# Patient Record
Sex: Male | Born: 1950
Health system: Southern US, Community
[De-identification: ages and names within clinical notes are randomized; demographics above are authoritative.]

## PROBLEM LIST (undated history)

## (undated) DIAGNOSIS — M79604 Pain in right leg: Secondary | ICD-10-CM

## (undated) DIAGNOSIS — M1990 Unspecified osteoarthritis, unspecified site: Secondary | ICD-10-CM

## (undated) DIAGNOSIS — J3489 Other specified disorders of nose and nasal sinuses: Secondary | ICD-10-CM

## (undated) DIAGNOSIS — M545 Low back pain, unspecified: Secondary | ICD-10-CM

## (undated) DIAGNOSIS — J302 Other seasonal allergic rhinitis: Secondary | ICD-10-CM

## (undated) DIAGNOSIS — I10 Essential (primary) hypertension: Secondary | ICD-10-CM

## (undated) DIAGNOSIS — K219 Gastro-esophageal reflux disease without esophagitis: Secondary | ICD-10-CM

## (undated) HISTORY — PX: COLONOSCOPY W/ POLYPECTOMY: SHX1380

## (undated) HISTORY — PX: TONSILLECTOMY: SUR1361

## (undated) HISTORY — PX: BACK SURGERY: SHX140

## (undated) HISTORY — PX: APPENDECTOMY: SHX54

## (undated) HISTORY — PX: ROTATOR CUFF REPAIR: SHX139

## (undated) HISTORY — DX: Low back pain: M54.5

## (undated) HISTORY — PX: JOINT REPLACEMENT: SHX530

## (undated) HISTORY — DX: Low back pain, unspecified: M54.50

## (undated) HISTORY — DX: Pain in right leg: M79.604

## (undated) HISTORY — PX: KNEE ARTHROSCOPY: SHX127

---

## 1997-09-23 ENCOUNTER — Encounter: Admission: RE | Admit: 1997-09-23 | Discharge: 1997-10-23 | Payer: Self-pay | Admitting: Anesthesiology

## 2000-02-19 ENCOUNTER — Ambulatory Visit (HOSPITAL_BASED_OUTPATIENT_CLINIC_OR_DEPARTMENT_OTHER): Admission: RE | Admit: 2000-02-19 | Discharge: 2000-02-19 | Payer: Self-pay | Admitting: *Deleted

## 2003-04-08 ENCOUNTER — Ambulatory Visit (HOSPITAL_BASED_OUTPATIENT_CLINIC_OR_DEPARTMENT_OTHER): Admission: RE | Admit: 2003-04-08 | Discharge: 2003-04-08 | Payer: Self-pay | Admitting: Specialist

## 2003-04-08 ENCOUNTER — Ambulatory Visit (HOSPITAL_COMMUNITY): Admission: RE | Admit: 2003-04-08 | Discharge: 2003-04-08 | Payer: Self-pay | Admitting: Specialist

## 2003-05-06 ENCOUNTER — Encounter: Admission: RE | Admit: 2003-05-06 | Discharge: 2003-05-06 | Payer: Self-pay | Admitting: Internal Medicine

## 2003-06-21 ENCOUNTER — Encounter: Admission: RE | Admit: 2003-06-21 | Discharge: 2003-06-21 | Payer: Self-pay | Admitting: Specialist

## 2005-09-09 ENCOUNTER — Ambulatory Visit (HOSPITAL_COMMUNITY): Admission: RE | Admit: 2005-09-09 | Discharge: 2005-09-10 | Payer: Self-pay | Admitting: Specialist

## 2007-03-15 ENCOUNTER — Emergency Department (HOSPITAL_COMMUNITY): Admission: EM | Admit: 2007-03-15 | Discharge: 2007-03-15 | Payer: Self-pay | Admitting: Emergency Medicine

## 2009-02-19 ENCOUNTER — Encounter: Admission: RE | Admit: 2009-02-19 | Discharge: 2009-02-19 | Payer: Self-pay | Admitting: Specialist

## 2009-02-21 ENCOUNTER — Ambulatory Visit (HOSPITAL_BASED_OUTPATIENT_CLINIC_OR_DEPARTMENT_OTHER): Admission: RE | Admit: 2009-02-21 | Discharge: 2009-02-22 | Payer: Self-pay | Admitting: Specialist

## 2010-02-22 ENCOUNTER — Encounter: Payer: Self-pay | Admitting: Specialist

## 2010-04-19 LAB — BASIC METABOLIC PANEL
BUN: 14 mg/dL (ref 6–23)
Chloride: 103 mEq/L (ref 96–112)
Creatinine, Ser: 1.14 mg/dL (ref 0.4–1.5)
GFR calc non Af Amer: >60 mL/min (ref >60)
Glucose, Bld: 87 mg/dL (ref 70–99)

## 2010-04-19 LAB — CBC
MCHC: 34 g/dL (ref 30.0–36.0)
Platelets: 253 10*3/uL (ref 150–400)
RDW: 11.9 % (ref 11.5–15.5)

## 2010-06-19 NOTE — Op Note (Signed)
NAME:  Manuel Hood, Manuel Hood                           ACCOUNT NO.:  [id number]   MEDICAL RECORD NO.:  [id number]                   PATIENT TYPE:  AMB   LOCATION:  DSC                                  FACILITY:  [facility]   PHYSICIAN:  Jene Every, M.D.                 DATE OF BIRTH:  Mar 21, 1950   DATE OF PROCEDURE:  04/08/2003  DATE OF DISCHARGE:                                 OPERATIVE REPORT   PREOPERATIVE DIAGNOSIS:  Medial meniscus tear, degenerative joint disease  right knee.   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   PROCEDURE:  1. Right knee arthroscopy with partial medial meniscectomy.  2. Chondroplasty of the medial femoral condyle.  3. Chondroplasty of the patella.   ANESTHESIA:  General.   SURGEON:  Jene Every, M.D.   ASSISTANT:  None.   DISPOSITION:  The patient tolerated the procedure well with no  complications.  Follow up in 10 days for suture removal.   DESCRIPTION OF PROCEDURE:  With the patient in the supine position after  induction of general anesthesia and 1 gram of Kefzol, the right lower  extremity was prepped and draped in the usual sterile fashion.  A lateral  parapatellar portal and a superior medial parapatellar portal was fashioned  with a #11 blade.  Ingress cannula was atraumatically placed.  Irrigant was  utilized and inflated the joint.  Arthroscopic camera then inserted via  lateral portal and under direct visualization, a medial parapatellar portal  was fashion with a #11 blade after localization with an 18 gauge needle  sparing the medial meniscus.  Inspection of the suprapatellar pouch revealed  extensive grade III changes of the patella with fibrillation.  Loose  cartilaginous bodies noted and debris within the joint.  I introduced the  shaver and utilized it to perform a chondroplasty of the patella.  There was  normal patellofemoral tracking.  The  gutters were unremarkable.  In the  medial compartment, there was extensive grade III changes of the medial  femoral condyle and a complex tear of the posterior aspect of the meniscus  unstable.  Basket rongeurs were introduced and utilized to perform a partial  medial meniscectomy to a stable base.  Further contoured with a 4-2 Cuda  shaver.  Chondroplasty of the medial femoral condyle was performed as well.  Grade III lesion was noted.  There was an extensive involvement of the  medial femoral condyle, no grade IV change.   ACL and PCL unremarkable.  Lateral compartment revealed essentially normal  lateral meniscus, femoral condyle, and tibial plateau, some minor grade I to  II changes.  Stable to probe palpation.  No evidence of osteochondral defect  with probing and visualization of the entire femur and tibia.  Again back to  the medial meniscus, it was stable to probe palpation  following the  meniscectomy.  The remainder of the condyle and tibial plateau was  unremarkable.  Next, the knee was copiously lavaged.  All instrumentation  was removed.  Portals were closed with 4-0 nylon  subcuticular suture.  0.25% Marcaine with epinephrine was infiltrated in the  joint.  The wound was dressed sterilely.  He was awakened without difficulty  and transported to the recovery room in satisfactory condition.  The patient  tolerated the procedure well with no complications.                                               Jene Every, M.D.    Cordelia Pen  D:  04/08/2003  T:  04/09/2003  Job:  [id number]

## 2010-06-19 NOTE — Op Note (Signed)
[facility]  Patient:    Manuel Hood, Manuel Hood                        MRN: [id number] Proc. Date: 02/19/00 Adm. Date:  [id number] Disc. Date: [id number] Attending:  Aundria Mems                           Operative Report  PREOPERATIVE DIAGNOSES: 1. Deviated nasal septum. 2. Hyperplasia of nasal turbinates.  POSTOPERATIVE DIAGNOSES: 1. Deviated nasal septum. 2. Hyperplasia of nasal turbinates.  OPERATIVE PROCEDURE: 1. Nasal septoplasty. 2. Bilateral submucous resection of inferior turbinates.  SURGEON:  Kathy Breach, M.D.  DESCRIPTION OF PROCEDURE:  With the patient under general orotracheal anesthesia, the nose was prepped and draped in a sterile fashion.  A nasal block was applied with 4% Xylocaine with 1% ephedrine solution and olive-tip probes to the sphenopalatine and anterior ethmoid nerve areas bilaterally. Cotton pledgets soaked with a similar solution were inserted along the middle and inferior turbinates for vasoconstriction.  The columella and nasal septum and later inferior turbinates were infiltrated with 1% Xylocaine with 1:100,000 epinephrine for further vasoconstrictive effort.  Inspection of the patients nose with a decongestant, he had a marked deviation off the maxillary crest into the right side, nearly abutting on to the decongested inferior turbinate.  Superiorly, there was deflection of the perpendicular ethmoid plate to the opposite side to the left.  The patient had prominent middle turbinate, particularly on the right side, and compensatory hyperplasia of the left inferior turbinate, and a prominent right inferior turbinate.  A caudal incision was made inside the left nasal vestibule and mucosal flaps were elevated off the cartilaginous bony septum bilaterally.  The posterior and inferior quadrilateral cartilage along with the maxillary crest spur was taken down with knives and rongeurs.  The quadrilateral cartilage  was separated from the perpendicular ethmoid bony plate posterosuperiorly, and the deflected ethmoid plate removed back into the vomer which had a spur to the right posteriorly.  This allowed the septum to come into a near straight midline position with ________ airways bilaterally.  There was a tear along the maxillary crest spur into the right side and the left mucosal flap was completely intact.  A stab incision was made over the anterior aspect of the left inferior turbinate.  A superiorly based septal surface mucosal flap was elevated off the large and prominent turbinate bone.  The lower half of turbinate bone was attached.  The mucosa was sharply excised with angled scissors.  Hemostasis along the bony mucosal incisions was made complete with suction cautery as well as ablating the posterior extension of the turbinate.  A similar procedure was done to the right inferior turbinate.  The prominent right middle turbinate was crushed and gently outfractured as well.  The simple incision was then closed with interrupted sutures of 4-0 chromic catgut.  The nose was packed bilaterally with Vaseline gauze impregnated with Cortisporin ointment.  The patients oropharynx was cleared at the end of the case.  The patient tolerated the procedure well and was taken to the recovery room in stable general condition.  Estimated blood loss for the procedure was less than 50 cc. DD:  02/19/00 TD:  02/21/00 Job: [id number]

## 2010-06-19 NOTE — Op Note (Signed)
NAME:  Manuel Hood, Manuel Hood                 ACCOUNT NO.:  [id number]   MEDICAL RECORD NO.:  [id number]          PATIENT TYPE:  AMB   LOCATION:  DAY                          FACILITY:  [facility]   PHYSICIAN:  Jene Every, M.D.    DATE OF BIRTH:  [birth year]   DATE OF PROCEDURE:  09/09/2005  DATE OF DISCHARGE:                                 OPERATIVE REPORT   PREOPERATIVE DIAGNOSIS:  Spinal stenosis, L4-5, right, with L5  radiculopathy.   POSTOPERATIVE DIAGNOSIS:  Spinal stenosis, L4-5, right, with L5  radiculopathy.   PROCEDURE PERFORMED:  1. Lateral recess decompression, L4-5, right.  2. Microdiskectomy, L4-5.  3. Foraminotomy of 4 and 5.   SURGEON:  Jene Every, M.D.   ASSISTANT:  Roma Schanz, P.A.   ANESTHESIA:  General.   BLOOD LOSS:  Minimal.   BRIEF HISTORY AND INDICATION:  This is a 60 year old gentleman treated for a  long period of time for right lower extremity radicular pain, predominantly  in the L5 nerve root distribution.  The patient had increasing pain  radiating to the buttocks, thigh, calf and into the dorsum of the foot.  Particularly in the L5 nerve root distribution, he had EHL weakness and  positive neurotension signs.  Interestingly, his MRI demonstrated some  lateral recess stenosis at 4-5 and disk protrusion is at 3-4, bulging to the  right foramen, possible effacement of the 3 nerve root; however, he had no  L3 nerve root pain.  He had predominantly 4-5 pain.  On his AP x-ray, he had  some slight scoliosis to the right with slight asymmetry of the disk, facet  hypertrophy and in the parasagittal image on the MRI, he had lateral recess  stenosis.  He was indicated for a decompression due to his refractory pain  and EHL weakness.  He had epidural steroid injections in the past with  temporary relief of the symptoms.  We did discuss the risks and benefits,  given there was no large disk herniation, therefore of bleeding, infection,  damage to vascular  structures, no change in symptoms, worsening of the  symptoms and need for repeat decompression in the future, anesthetic  complications, etc.   TECHNIQUE:  The patient was placed in a supine position and after the  induction of adequate anesthesia and 1 g of Kefzol, he was placed prone on  the Bug Tussle frame.  All bony prominences were well-padded.  The lumbar  region was prepped and draped in the usual sterile fashion.  Two 18-gauge  spinal needles were utilized to localize the 4-5 interspace and confirmed  with x-ray.  Incision was made from the spinous process of 4 to 5.  Subcutaneous tissue was dissected and electrocautery was utilized to achieve  hemostasis.  The dorsolumbar fascia was identified bilaterally in the skin  incision and paraspinous muscle elevated from the lamina of 4 and 5.  A  McCullough retractor was then placed.  With a Penfield 4 in the interlaminar  space, we confirmed the x-ray at 4-5.  We did this with the AP lateral x-  rays that were  taken and the MRI that was read preoperatively to confirm the  4-5 space.  There was a very small interlaminar window at 4-5.  This was  secondary to facet hypertrophy.  I performed a hemilaminotomy of the caudate  edge at 4 using a high-speed bur and completing that with a 10-mm Kerrison.  The superior articulating facet of 5 was noted to be impinging into the  lateral recess.  It was compressing upon the 5 root.  I then performed a  foraminotomy of 5, removing the caudad edge of 5 with a 2-mm Kerrison with  multiple passes, performing a near-hemilaminectomy of 5 with a foraminotomy  of 5.  At a point just here medial and at the facet, due to facet  hypertrophy, ligamentum flavum infold and a small disk protrusion, there was  severe compression of the 5 root in the lateral recess.  There was an  epidural venous plexus noted as well, tethering the 5 root.  This was lysed  and we decompressed the 5 root in the lateral recess to the  medial border of  the pedicle.  We continue caudad to release the ligamentum flavum from the  caudad edge of 4.  Bone wax was placed on the cancellous surfaces due to a  small disk protrusion noted in order to perform the annulotomy and remove  the small disk material that was subannular; this was a straight-biting  pituitary.  A single incision was made in the annulus.  I then passed the  __________ -tip probe down the foramen of 5 and through the foramen of 4;  they were widely patent.  There was good excursion of 5 for at least a  centimeter medial to the pedicle.  No active bleeding or CSF leakage was  noted.  It was felt that the pathology here was consistent with that seen on  his exam and the parasagittal MRI imaging.   After copious irrigation with antibiotic irrigation, thrombin-soaked Gelfoam  was placed into the laminotomy defect area.  A final radiograph was obtained  with probing the foramen of 4 to confirm the 4-5 level on the right.  Following this, again the disk space was copiously irrigated.  Thrombin-  soaked Gelfoam was placed and the McCullough retractor was removed.  The  paraspinous muscles were inspected with no evidence of active bleeding.  Copious irrigation was utilized.  The dorsolumbar fascia was reapproximated  with #1 Vicryl interrupted figure-of-eight sutures, subcutaneous tissue  reapproximated with 2-0 Vicryl subcuticular stitches and skin was  reapproximated with 4-0 subcuticular Prolene and wound reinforced with Steri-  Strips, sterile dressing applied, placed supine in the hospital bed,  extubated without difficulty and transported to the recovery room in  satisfactory condition.   The patient tolerated the procedure well with no complication.      Jene Every, M.D.  Electronically Signed     JB/MEDQ  D:  09/09/2005  T:  09/09/2005  Job:  [id number]

## 2010-11-09 ENCOUNTER — Ambulatory Visit (HOSPITAL_BASED_OUTPATIENT_CLINIC_OR_DEPARTMENT_OTHER)
Admission: RE | Admit: 2010-11-09 | Discharge: 2010-11-09 | Disposition: A | Discharge status: Home / Self Care | Payer: BC Managed Care – PPO | Source: Ambulatory Visit | Attending: Specialist | Admitting: Specialist

## 2010-11-09 DIAGNOSIS — Z01810 Encounter for preprocedural cardiovascular examination: Secondary | ICD-10-CM | POA: Insufficient documentation

## 2010-11-09 DIAGNOSIS — M23329 Other meniscus derangements, posterior horn of medial meniscus, unspecified knee: Secondary | ICD-10-CM | POA: Insufficient documentation

## 2010-11-09 DIAGNOSIS — I10 Essential (primary) hypertension: Secondary | ICD-10-CM | POA: Insufficient documentation

## 2010-11-09 DIAGNOSIS — Z01812 Encounter for preprocedural laboratory examination: Secondary | ICD-10-CM | POA: Insufficient documentation

## 2010-11-09 DIAGNOSIS — M1710 Unilateral primary osteoarthritis, unspecified knee: Secondary | ICD-10-CM | POA: Insufficient documentation

## 2010-11-09 DIAGNOSIS — M23302 Other meniscus derangements, unspecified lateral meniscus, unspecified knee: Secondary | ICD-10-CM | POA: Insufficient documentation

## 2010-11-09 LAB — POCT I-STAT 4, (NA,K, GLUC, HGB,HCT)
Glucose, Bld: 86 mg/dL (ref 70–99)
HCT: 47 % (ref 39.0–52.0)
Hemoglobin: 16 g/dL (ref 13.0–17.0)

## 2010-11-12 NOTE — Op Note (Signed)
  Manuel Hood, Manuel Hood                 ACCOUNT NO.:  [id number]  MEDICAL RECORD NO.:  [id number]  LOCATION:                               FACILITY:  [facility]  PHYSICIAN:  Jene Every, M.D.    DATE OF BIRTH:  Feb 12, 1950  DATE OF PROCEDURE:  11/09/2010 DATE OF DISCHARGE:                              OPERATIVE REPORT   PREOPERATIVE DIAGNOSIS:  Medial meniscus tear, osteoarthritis of the right knee.  POSTOPERATIVE DIAGNOSIS:  Medial meniscus tear, osteoarthritis of the right knee, lateral meniscus tear.  PROCEDURE PERFORMED: 1. Right knee arthroscopy. 2. Partial medial and lateral meniscectomy. 3. Chondroplasty medial femoral condyle of the patella.  ANESTHESIA:  General.  No assistant.  BRIEF HISTORY:  This 60 year old with history of arthroscopy, DJD, osteoarthritis, refractory conservative treatment, mechanical symptoms, locking and giving way is indicated for diagnostic arthroscopy and debridement.  Risks and benefits discussed including bleeding, infection, damage to neurovascular structures, no change in symptoms, worsening symptoms, need for repeat debridement, DVT, PE, anesthetic complications, total knee in the future.  TECHNIQUE:  With patient in supine position after induction of adequate general anesthesia, 2 g of Kefzol.  The right lower extremity was prepped and draped in the usual sterile fashion.  A lateral parapatellar portal and superomedial parapatellar portal was fashioned with a 11 blade.  Ingress cannula atraumatically placed.  Irrigant was utilized to insufflate the joint.  Under direct visualization, a medial parapatellar portal was fashioned with a 11 blade after localization with an 18-gauge needle sparing the medial meniscus.  Noticed in the medial compartment was extensive grade 4 changes on the tibial plateau as well as the femoral condyle.  A complex tearing of the entire posterior half of the meniscus was noted, stable, introduced a basket rongeur  and performed partial medial meniscectomy to a stable base further contoured with a 3 5 Cuda shaver.  Light chondroplasty of the femoral condyle was performed as well.  Evacuated loose bodies.  ACL was unremarkable.  Lateral compartment revealed radial tear in the lateral meniscus and fraying.  This was shaved to a stable base with 3 5 Cuda shaver.  The femoral condyle and tibial plateau showed some grade 2 changes.  No grade 4 changes.  Suprapatellar pouch revealed some extensive grade 3 changes of the patella.  Normal patellofemoral tracking.  Gutters were unremarkable. Performed chondroplasty of the patella.  I lavaged knee, all compartments revisited.  No further pathology amenable to arthroscopic intervention.  I therefore removed all instrumentation.  Portals were closed with 4-0 nylon simple sutures. Marcaine 0.25% with epinephrine was infiltrated in the joint.  Wounds dressed sterilely, awoken without difficulty, and transported to recovery room in satisfactory condition.  Patient tolerated the procedure well.  No complications.     Jene Every, M.D.     Cordelia Pen  D:  11/09/2010  T:  11/10/2010  Job:  [id number]  Electronically Signed by Jene Every M.D. on 11/12/2010 02:25:50 PM

## 2010-12-29 ENCOUNTER — Other Ambulatory Visit: Payer: Self-pay | Admitting: Otolaryngology

## 2011-01-19 ENCOUNTER — Encounter (HOSPITAL_COMMUNITY): Payer: Self-pay | Admitting: Pharmacy Technician

## 2011-01-22 NOTE — H&P (Signed)
Chief Complaint:  right knee pain.  Subjective: Patient is admitted for right total knee arthroplasty.  Patient is a 60 y.o. male who has been seen by Dr. Shelle Iron for ongoing hip pain.  They have been followed and found to have continued progressive pain.  They have been treated conservatively in the past including medications.  Despite conservative measures, they continue to have pain.  X-rays show that the patient has end stage arthritis of the knee.  It is felt that they would benefit from undergoing surgical intervention.  Risks and benefits have been discussed with the patient and they elect to proceed with surgery.  The patient has no contraindications to the upcoming procedure such as ongoing infection or progressive neurological disease.  Allergies: No Known Allergies   Medications: Micardis  Past Medical History: HTN   Past Surgical History: Knee arthroscopy RCR   Family History: No family history on file.  Social History: Quit smoking 15 years ago Drinks 1-2 beers a day Married Plans to go home with family after surgery   Review of Systems General: No chills, fevers, night sweats, fatigue. Neuro:  No seizures, syncope, paralysis, visual problems. Respiratory:  No shortness of breath, productive cough, hemoptysis. Cardiovascular: No chest pain, angina, palpitations, orthopnea. GI: No nausea, vomiting, diarrhea, constipation. GU:  No dysuria, hematuria, discharge. Musculoskeletal:  Joint pain  Physical Exam: GENERAL: Patient is a 60 y.o. male, well-nourished, well-developed, no acute distress. Alert, oriented, cooperative. HENT:  Normocephalic, atraumatic. Pupils round and reactive. EOMs intact. NECK:  Supple, no bruits. CHEST:  Clear to anterior and posterior chest walls. No rhonchi, rales, wheezes. HEART:  Regular, rate and rhythm.  No murmurs.  S1 and S2 noted. ABDOMEN:  Soft, nontender, bowel sounds present. RECTAL/BREAST/GENITALIA:  Not done, not pertinent to  present illness. EXTREMITIES:  Mild effusion, TTP medial joint line, + crepitus   Vitals: Pulse:80 Respirations:10 Blood Pressure:138/90   Assessment/Plan: End stage arthritis, right knee  The patient is being admitted to [facility] to undergo a right total knee arthroplasty.  Surgery will be performed by Dr. Jene Every.  Risks and benefits have been discussed with the patient and they elect to proceed wth the procedure.

## 2011-01-29 ENCOUNTER — Ambulatory Visit (HOSPITAL_COMMUNITY)
Admission: RE | Admit: 2011-01-29 | Discharge: 2011-01-29 | Disposition: A | Discharge status: Home / Self Care | Payer: Managed Care, Other (non HMO) | Source: Ambulatory Visit | Attending: Otolaryngology | Admitting: Otolaryngology

## 2011-01-29 ENCOUNTER — Encounter (HOSPITAL_COMMUNITY)
Admission: RE | Admit: 2011-01-29 | Discharge: 2011-01-29 | Disposition: A | Discharge status: Home / Self Care | Payer: Managed Care, Other (non HMO) | Source: Ambulatory Visit | Attending: Specialist | Admitting: Specialist

## 2011-01-29 ENCOUNTER — Encounter (HOSPITAL_COMMUNITY): Payer: Self-pay

## 2011-01-29 DIAGNOSIS — Z01812 Encounter for preprocedural laboratory examination: Secondary | ICD-10-CM | POA: Insufficient documentation

## 2011-01-29 DIAGNOSIS — M25469 Effusion, unspecified knee: Secondary | ICD-10-CM | POA: Insufficient documentation

## 2011-01-29 DIAGNOSIS — M898X9 Other specified disorders of bone, unspecified site: Secondary | ICD-10-CM | POA: Insufficient documentation

## 2011-01-29 DIAGNOSIS — Z01818 Encounter for other preprocedural examination: Secondary | ICD-10-CM | POA: Insufficient documentation

## 2011-01-29 HISTORY — DX: Essential (primary) hypertension: I10

## 2011-01-29 HISTORY — DX: Other specified disorders of nose and nasal sinuses: J34.89

## 2011-01-29 LAB — COMPREHENSIVE METABOLIC PANEL
AST: 21 U/L (ref 0–37)
Albumin: 4 g/dL (ref 3.5–5.2)
BUN: 18 mg/dL (ref 6–23)
Chloride: 103 mEq/L (ref 96–112)
Creatinine, Ser: 1 mg/dL (ref 0.50–1.35)
Potassium: 4.1 mEq/L (ref 3.5–5.1)
Total Protein: 7.5 g/dL (ref 6.0–8.3)

## 2011-01-29 LAB — URINALYSIS, ROUTINE W REFLEX MICROSCOPIC
Glucose, UA: NEGATIVE mg/dL
Ketones, ur: NEGATIVE mg/dL
Leukocytes, UA: NEGATIVE
Nitrite: NEGATIVE
Specific Gravity, Urine: 1.014 (ref 1.005–1.030)
pH: 6.5 (ref 5.0–8.0)

## 2011-01-29 LAB — CBC
HCT: 46.9 % (ref 39.0–52.0)
MCHC: 35.8 g/dL (ref 30.0–36.0)
MCV: 93.4 fL (ref 78.0–100.0)
Platelets: 233 10*3/uL (ref 150–400)
RDW: 12.1 % (ref 11.5–15.5)
WBC: 8.5 10*3/uL (ref 4.0–10.5)

## 2011-01-29 LAB — DIFFERENTIAL
Lymphs Abs: 1.6 10*3/uL (ref 0.7–4.0)
Monocytes Absolute: 0.7 10*3/uL (ref 0.1–1.0)
Monocytes Relative: 8 % (ref 3–12)
Neutro Abs: 5.7 10*3/uL (ref 1.7–7.7)
Neutrophils Relative %: 68 % (ref 43–77)

## 2011-01-29 LAB — SURGICAL PCR SCREEN
MRSA, PCR: NEGATIVE
Staphylococcus aureus: POSITIVE — AB

## 2011-01-29 LAB — APTT: aPTT: 28 seconds (ref 24–37)

## 2011-01-29 LAB — PROTIME-INR: INR: 1 (ref 0.00–1.49)

## 2011-01-29 MED ORDER — CHLORHEXIDINE GLUCONATE 4 % EX LIQD: 60.0000 mL | Freq: Once | CUTANEOUS | Status: DC

## 2011-01-29 NOTE — Patient Instructions (Addendum)
20 Manuel Hood  01/29/2011   Your procedure is scheduled on:  02/04/11  Report to [facility] at 5:30 AM.  Call this number if you have problems the morning of surgery: [phone]   Remember:   Do not eat food:After Midnight.  May have clear liquids:until Midnight .  Clear liquids include soda, tea, black coffee, apple or grape juice, broth.  Take these medicines the morning of surgery with A SIP OF WATER: none MAY TAKE OXYCODONE IF NEEDED   Do not wear jewelry, make-up or nail polish.  Do not wear lotions, powders, or perfumes. You may wear deodorant.  Do not shave 48 hours prior to surgery.  Do not bring valuables to the hospital.  Contacts, dentures or bridgework may not be worn into surgery.  Leave suitcase in the car. After surgery it may be brought to your room.  For patients admitted to the hospital, checkout time is 11:00 AM the day of discharge.   Patients discharged the day of surgery will not be allowed to drive home.  Name and phone number of your driver:   Special Instructions: CHG Shower Use Special Wash: 1/2 bottle night before surgery and 1/2 bottle morning of surgery.   Please read over the following fact sheets that you were given: MRSA Information

## 2011-02-04 ENCOUNTER — Encounter (HOSPITAL_COMMUNITY): Payer: Self-pay | Admitting: Registered Nurse

## 2011-02-04 ENCOUNTER — Encounter (HOSPITAL_COMMUNITY)
Admission: RE | Disposition: A | Discharge status: Home Health | Payer: Self-pay | Source: Ambulatory Visit | Attending: Specialist

## 2011-02-04 ENCOUNTER — Encounter (HOSPITAL_COMMUNITY): Payer: Self-pay | Admitting: *Deleted

## 2011-02-04 ENCOUNTER — Inpatient Hospital Stay (HOSPITAL_COMMUNITY): Payer: Managed Care, Other (non HMO) | Admitting: Registered Nurse

## 2011-02-04 ENCOUNTER — Inpatient Hospital Stay (HOSPITAL_COMMUNITY): Payer: Managed Care, Other (non HMO)

## 2011-02-04 ENCOUNTER — Inpatient Hospital Stay (HOSPITAL_COMMUNITY)
Admission: RE | Admit: 2011-02-04 | Discharge: 2011-02-06 | DRG: 470 | Disposition: A | Discharge status: Home Health | Payer: Managed Care, Other (non HMO) | Source: Ambulatory Visit | Attending: Specialist | Admitting: Specialist

## 2011-02-04 DIAGNOSIS — E871 Hypo-osmolality and hyponatremia: Secondary | ICD-10-CM | POA: Diagnosis not present

## 2011-02-04 DIAGNOSIS — M1990 Unspecified osteoarthritis, unspecified site: Secondary | ICD-10-CM

## 2011-02-04 DIAGNOSIS — M1710 Unilateral primary osteoarthritis, unspecified knee: Principal | ICD-10-CM | POA: Diagnosis present

## 2011-02-04 DIAGNOSIS — I10 Essential (primary) hypertension: Secondary | ICD-10-CM | POA: Diagnosis present

## 2011-02-04 HISTORY — PX: TOTAL KNEE ARTHROPLASTY: SHX125

## 2011-02-04 LAB — ABO/RH: ABO/RH(D): O POS

## 2011-02-04 SURGERY — ARTHROPLASTY, KNEE, TOTAL
Anesthesia: General | Site: Knee | Laterality: Right | Wound class: Clean

## 2011-02-04 MED ORDER — HYDROMORPHONE HCL PF 1 MG/ML IJ SOLN: 0.5000 mg | INTRAMUSCULAR | Status: DC | PRN

## 2011-02-04 MED ORDER — FENTANYL CITRATE 0.05 MG/ML IJ SOLN
INTRAMUSCULAR | Status: DC | PRN
  Administered 2011-02-04 (×5): 50 ug via INTRAVENOUS

## 2011-02-04 MED ORDER — HYDROMORPHONE HCL PF 1 MG/ML IJ SOLN
INTRAMUSCULAR | Status: DC | PRN
  Administered 2011-02-04: 0.5 mg via INTRAVENOUS
  Administered 2011-02-04 (×2): .5 mg via INTRAVENOUS

## 2011-02-04 MED ORDER — CEFAZOLIN SODIUM-DEXTROSE 2-3 GM-% IV SOLR
2.0000 g | Freq: Four times a day (QID) | INTRAVENOUS | Status: AC
  Administered 2011-02-04 – 2011-02-05 (×3): 2 g via INTRAVENOUS
  Filled 2011-02-04 (×3): qty 50

## 2011-02-04 MED ORDER — METHOCARBAMOL 500 MG PO TABS
500.0000 mg | ORAL_TABLET | Freq: Four times a day (QID) | ORAL | Status: DC | PRN
  Administered 2011-02-05 – 2011-02-06 (×3): 500 mg via ORAL
  Filled 2011-02-04 (×3): qty 1

## 2011-02-04 MED ORDER — LACTATED RINGERS IV SOLN
INTRAVENOUS | Status: DC
  Administered 2011-02-04: 07:00:00 via INTRAVENOUS

## 2011-02-04 MED ORDER — METHOCARBAMOL 100 MG/ML IJ SOLN
500.0000 mg | Freq: Four times a day (QID) | INTRAVENOUS | Status: DC | PRN
  Administered 2011-02-04: 500 mg via INTRAVENOUS
  Filled 2011-02-04: qty 5

## 2011-02-04 MED ORDER — ROPIVACAINE HCL 5 MG/ML IJ SOLN
INTRAMUSCULAR | Status: DC | PRN
  Administered 2011-02-04: 30 mL

## 2011-02-04 MED ORDER — TELMISARTAN-HCTZ 80-12.5 MG PO TABS: 1.0000 | ORAL_TABLET | ORAL | Status: DC

## 2011-02-04 MED ORDER — BUPIVACAINE-EPINEPHRINE 0.5% -1:200000 IJ SOLN
INTRAMUSCULAR | Status: DC | PRN
  Administered 2011-02-04: 20 mL

## 2011-02-04 MED ORDER — SUCCINYLCHOLINE CHLORIDE 20 MG/ML IJ SOLN
INTRAMUSCULAR | Status: DC | PRN
  Administered 2011-02-04: 100 mg via INTRAVENOUS

## 2011-02-04 MED ORDER — OLMESARTAN MEDOXOMIL 40 MG PO TABS
40.0000 mg | ORAL_TABLET | Freq: Every day | ORAL | Status: DC
  Administered 2011-02-05 – 2011-02-06 (×2): 40 mg via ORAL
  Filled 2011-02-04 (×2): qty 1

## 2011-02-04 MED ORDER — HYDROMORPHONE 0.3 MG/ML IV SOLN
INTRAVENOUS | Status: DC
  Administered 2011-02-04: 0.6 mg via INTRAVENOUS
  Administered 2011-02-04: 2.39 mg via INTRAVENOUS
  Administered 2011-02-04: 12:00:00 via INTRAVENOUS
  Administered 2011-02-04: 1.59 mg via INTRAVENOUS
  Administered 2011-02-05: 1.19 mg via INTRAVENOUS
  Administered 2011-02-05: 02:00:00 via INTRAVENOUS
  Administered 2011-02-05: 1.19 mg via INTRAVENOUS
  Filled 2011-02-04 (×3): qty 25

## 2011-02-04 MED ORDER — RIVAROXABAN 10 MG PO TABS
10.0000 mg | ORAL_TABLET | Freq: Every day | ORAL | Status: DC
  Administered 2011-02-05 – 2011-02-06 (×2): 10 mg via ORAL
  Filled 2011-02-04 (×2): qty 1

## 2011-02-04 MED ORDER — MENTHOL 3 MG MT LOZG: 1.0000 | LOZENGE | OROMUCOSAL | Status: DC | PRN

## 2011-02-04 MED ORDER — ACETAMINOPHEN 10 MG/ML IV SOLN
INTRAVENOUS | Status: DC | PRN
  Administered 2011-02-04: 1000 mg via INTRAVENOUS

## 2011-02-04 MED ORDER — LIDOCAINE HCL (CARDIAC) 20 MG/ML IV SOLN
INTRAVENOUS | Status: DC | PRN
  Administered 2011-02-04: 80 mg via INTRAVENOUS

## 2011-02-04 MED ORDER — ACETAMINOPHEN 325 MG PO TABS
650.0000 mg | ORAL_TABLET | Freq: Four times a day (QID) | ORAL | Status: DC | PRN
  Administered 2011-02-05: 650 mg via ORAL
  Filled 2011-02-04: qty 2

## 2011-02-04 MED ORDER — MIDAZOLAM HCL 5 MG/5ML IJ SOLN
INTRAMUSCULAR | Status: DC | PRN
  Administered 2011-02-04: 2 mg via INTRAVENOUS

## 2011-02-04 MED ORDER — POTASSIUM CHLORIDE IN NACL 20-0.9 MEQ/L-% IV SOLN
INTRAVENOUS | Status: DC
  Administered 2011-02-04 – 2011-02-05 (×2): via INTRAVENOUS
  Filled 2011-02-04 (×7): qty 1000

## 2011-02-04 MED ORDER — ROCURONIUM BROMIDE 100 MG/10ML IV SOLN
INTRAVENOUS | Status: DC | PRN
  Administered 2011-02-04: 40 mg via INTRAVENOUS
  Administered 2011-02-04: 10 mg via INTRAVENOUS

## 2011-02-04 MED ORDER — PROMETHAZINE HCL 25 MG/ML IJ SOLN: 6.2500 mg | INTRAMUSCULAR | Status: DC | PRN

## 2011-02-04 MED ORDER — HYDROCHLOROTHIAZIDE 12.5 MG PO CAPS
12.5000 mg | ORAL_CAPSULE | Freq: Every day | ORAL | Status: DC
  Administered 2011-02-05 – 2011-02-06 (×2): 12.5 mg via ORAL
  Filled 2011-02-04 (×2): qty 1

## 2011-02-04 MED ORDER — ONDANSETRON HCL 4 MG/2ML IJ SOLN: 4.0000 mg | Freq: Four times a day (QID) | INTRAMUSCULAR | Status: DC | PRN

## 2011-02-04 MED ORDER — ONDANSETRON HCL 4 MG PO TABS: 4.0000 mg | ORAL_TABLET | Freq: Four times a day (QID) | ORAL | Status: DC | PRN

## 2011-02-04 MED ORDER — HYDROMORPHONE HCL PF 1 MG/ML IJ SOLN
0.2500 mg | INTRAMUSCULAR | Status: DC | PRN
  Administered 2011-02-04 (×4): 0.5 mg via INTRAVENOUS

## 2011-02-04 MED ORDER — METOCLOPRAMIDE HCL 10 MG PO TABS: 5.0000 mg | ORAL_TABLET | Freq: Three times a day (TID) | ORAL | Status: DC | PRN

## 2011-02-04 MED ORDER — SODIUM CHLORIDE 0.9 % IR SOLN
Status: DC | PRN
  Administered 2011-02-04: 08:00:00

## 2011-02-04 MED ORDER — DIPHENHYDRAMINE HCL 12.5 MG/5ML PO ELIX: 12.5000 mg | ORAL_SOLUTION | Freq: Four times a day (QID) | ORAL | Status: DC | PRN

## 2011-02-04 MED ORDER — ACETAMINOPHEN 650 MG RE SUPP: 650.0000 mg | Freq: Four times a day (QID) | RECTAL | Status: DC | PRN

## 2011-02-04 MED ORDER — PHENOL 1.4 % MT LIQD: 1.0000 | OROMUCOSAL | Status: DC | PRN

## 2011-02-04 MED ORDER — DIPHENHYDRAMINE HCL 50 MG/ML IJ SOLN: 12.5000 mg | Freq: Four times a day (QID) | INTRAMUSCULAR | Status: DC | PRN

## 2011-02-04 MED ORDER — EPHEDRINE SULFATE 50 MG/ML IJ SOLN
INTRAMUSCULAR | Status: DC | PRN
  Administered 2011-02-04: 5 mg via INTRAVENOUS

## 2011-02-04 MED ORDER — SODIUM CHLORIDE 0.9 % IR SOLN
Status: DC | PRN
  Administered 2011-02-04: 3000 mL

## 2011-02-04 MED ORDER — CEFAZOLIN SODIUM-DEXTROSE 2-3 GM-% IV SOLR
2.0000 g | INTRAVENOUS | Status: AC
  Administered 2011-02-04: 2 g via INTRAVENOUS

## 2011-02-04 MED ORDER — SODIUM CHLORIDE 0.9 % IJ SOLN: 9.0000 mL | INTRAMUSCULAR | Status: DC | PRN

## 2011-02-04 MED ORDER — OXYCODONE-ACETAMINOPHEN 5-325 MG PO TABS
1.0000 | ORAL_TABLET | ORAL | Status: DC | PRN
  Administered 2011-02-05: 1 via ORAL
  Administered 2011-02-05 (×2): 2 via ORAL
  Administered 2011-02-05: 1 via ORAL
  Administered 2011-02-05: 2 via ORAL
  Administered 2011-02-05: 1 via ORAL
  Administered 2011-02-06: 2 via ORAL
  Filled 2011-02-04 (×2): qty 2
  Filled 2011-02-04: qty 1
  Filled 2011-02-04 (×2): qty 2
  Filled 2011-02-04: qty 1
  Filled 2011-02-04: qty 2

## 2011-02-04 MED ORDER — PROPOFOL 10 MG/ML IV EMUL
INTRAVENOUS | Status: DC | PRN
  Administered 2011-02-04: 200 mg via INTRAVENOUS

## 2011-02-04 MED ORDER — ONDANSETRON HCL 4 MG/2ML IJ SOLN
INTRAMUSCULAR | Status: DC | PRN
  Administered 2011-02-04: 4 mg via INTRAVENOUS

## 2011-02-04 MED ORDER — METOCLOPRAMIDE HCL 5 MG/ML IJ SOLN: 5.0000 mg | Freq: Three times a day (TID) | INTRAMUSCULAR | Status: DC | PRN

## 2011-02-04 MED ORDER — DIPHENHYDRAMINE HCL 12.5 MG/5ML PO ELIX: 12.5000 mg | ORAL_SOLUTION | ORAL | Status: DC | PRN

## 2011-02-04 MED ORDER — DOCUSATE SODIUM 100 MG PO CAPS
100.0000 mg | ORAL_CAPSULE | Freq: Two times a day (BID) | ORAL | Status: DC
  Administered 2011-02-04 – 2011-02-06 (×4): 100 mg via ORAL
  Filled 2011-02-04 (×6): qty 1

## 2011-02-04 MED ORDER — DEXAMETHASONE SODIUM PHOSPHATE 10 MG/ML IJ SOLN
INTRAMUSCULAR | Status: DC | PRN
  Administered 2011-02-04: 10 mg via INTRAVENOUS

## 2011-02-04 MED ORDER — NALOXONE HCL 0.4 MG/ML IJ SOLN: 0.4000 mg | INTRAMUSCULAR | Status: DC | PRN

## 2011-02-04 MED ORDER — GLYCOPYRROLATE 0.2 MG/ML IJ SOLN
INTRAMUSCULAR | Status: DC | PRN
  Administered 2011-02-04: .8 mg via INTRAVENOUS

## 2011-02-04 MED ORDER — NEOSTIGMINE METHYLSULFATE 1 MG/ML IJ SOLN
INTRAMUSCULAR | Status: DC | PRN
  Administered 2011-02-04: 5 mg via INTRAVENOUS

## 2011-02-04 SURGICAL SUPPLY — 56 items
BAG ZIPLOCK 12X15 (MISCELLANEOUS) ×2 IMPLANT
BANDAGE ELASTIC 4 VELCRO ST LF (GAUZE/BANDAGES/DRESSINGS) ×2 IMPLANT
BANDAGE ELASTIC 6 VELCRO ST LF (GAUZE/BANDAGES/DRESSINGS) ×2 IMPLANT
BANDAGE ESMARK 6X9 LF (GAUZE/BANDAGES/DRESSINGS) ×1 IMPLANT
BLADE SAG 18X100X1.27 (BLADE) ×2 IMPLANT
BLADE SAW SGTL 13.0X1.19X90.0M (BLADE) ×2 IMPLANT
BNDG ESMARK 6X9 LF (GAUZE/BANDAGES/DRESSINGS) ×2
CEMENT HV SMART SET (Cement) ×2 IMPLANT
CHLORAPREP W/TINT 26ML (MISCELLANEOUS) IMPLANT
CLOTH BEACON ORANGE TIMEOUT ST (SAFETY) ×2 IMPLANT
CUFF TOURN SGL QUICK 34 (TOURNIQUET CUFF) ×1
CUFF TRNQT CYL 34X4X40X1 (TOURNIQUET CUFF) ×1 IMPLANT
DECANTER SPIKE VIAL GLASS SM (MISCELLANEOUS) ×2 IMPLANT
DRAPE LG THREE QUARTER DISP (DRAPES) ×4 IMPLANT
DRAPE ORTHO SPLIT 77X108 STRL (DRAPES) ×2
DRAPE POUCH INSTRU U-SHP 10X18 (DRAPES) ×2 IMPLANT
DRAPE SURG ORHT 6 SPLT 77X108 (DRAPES) ×2 IMPLANT
DRAPE U-SHAPE 47X51 STRL (DRAPES) ×2 IMPLANT
DRSG ADAPTIC 3X8 NADH LF (GAUZE/BANDAGES/DRESSINGS) ×2 IMPLANT
DRSG PAD ABDOMINAL 8X10 ST (GAUZE/BANDAGES/DRESSINGS) ×2 IMPLANT
DURAPREP 26ML APPLICATOR (WOUND CARE) ×2 IMPLANT
ELECT REM PT RETURN 9FT ADLT (ELECTROSURGICAL) ×2
ELECTRODE REM PT RTRN 9FT ADLT (ELECTROSURGICAL) ×1 IMPLANT
EVACUATOR 1/8 PVC DRAIN (DRAIN) ×2 IMPLANT
FACESHIELD LNG OPTICON STERILE (SAFETY) ×10 IMPLANT
GAUZE SPONGE 4X4 12PLY STRL LF (GAUZE/BANDAGES/DRESSINGS) ×2 IMPLANT
GLOVE BIOGEL PI IND STRL 6.5 (GLOVE) ×1 IMPLANT
GLOVE BIOGEL PI IND STRL 8 (GLOVE) ×1 IMPLANT
GLOVE BIOGEL PI INDICATOR 6.5 (GLOVE) ×1
GLOVE BIOGEL PI INDICATOR 8 (GLOVE) ×1
GLOVE ECLIPSE 6.5 STRL STRAW (GLOVE) ×2 IMPLANT
GLOVE SURG SS PI 8.0 STRL IVOR (GLOVE) ×4 IMPLANT
GOWN PREVENTION PLUS XLARGE (GOWN DISPOSABLE) ×2 IMPLANT
HANDPIECE INTERPULSE COAX TIP (DISPOSABLE) ×2
IMMOBILIZER KNEE 20 (SOFTGOODS) ×2
IMMOBILIZER KNEE 20 THIGH 36 (SOFTGOODS) ×1 IMPLANT
KIT BASIN OR (CUSTOM PROCEDURE TRAY) ×2 IMPLANT
MANIFOLD NEPTUNE II (INSTRUMENTS) ×2 IMPLANT
NS IRRIG 1000ML POUR BTL (IV SOLUTION) IMPLANT
PACK TOTAL JOINT (CUSTOM PROCEDURE TRAY) ×2 IMPLANT
PADDING CAST COTTON 6X4 STRL (CAST SUPPLIES) ×2 IMPLANT
POSITIONER SURGICAL ARM (MISCELLANEOUS) ×2 IMPLANT
SET HNDPC FAN SPRY TIP SCT (DISPOSABLE) ×1 IMPLANT
SPONGE SURGIFOAM ABS GEL 100 (HEMOSTASIS) ×2 IMPLANT
STAPLER VISISTAT (STAPLE) ×2 IMPLANT
SUCTION FRAZIER 12FR DISP (SUCTIONS) ×2 IMPLANT
SUT BONE WAX W31G (SUTURE) ×2 IMPLANT
SUT VIC AB 1 CT1 27 (SUTURE) ×8
SUT VIC AB 1 CT1 27XBRD ANTBC (SUTURE) ×4 IMPLANT
SUT VIC AB 2-0 CT1 27 (SUTURE) ×3
SUT VIC AB 2-0 CT1 TAPERPNT 27 (SUTURE) ×3 IMPLANT
SYR 30ML LL (SYRINGE) ×2 IMPLANT
TOWER CARTRIDGE SMART MIX (DISPOSABLE) ×2 IMPLANT
TRAY FOLEY CATH 14FRSI W/METER (CATHETERS) ×2 IMPLANT
WATER STERILE IRR 1500ML POUR (IV SOLUTION) ×2 IMPLANT
WRAP KNEE MAXI GEL POST OP (GAUZE/BANDAGES/DRESSINGS) ×2 IMPLANT

## 2011-02-04 NOTE — Interval H&P Note (Signed)
History and Physical Interval Note:  02/04/2011 7:12 AM  Manuel Hood  has presented today for surgery, with the diagnosis of degenerative arthritis disease right knee   The various methods of treatment have been discussed with the patient and family. After consideration of risks, benefits and other options for treatment, the patient has consented to  Procedure(s): TOTAL KNEE ARTHROPLASTY as a surgical intervention .  The patients' history has been reviewed, patient examined, no change in status, stable for surgery.  I have reviewed the patients' chart and labs.  Questions were answered to the patient's satisfaction.     Shirin Echeverry C

## 2011-02-04 NOTE — Anesthesia Procedure Notes (Signed)
Anesthesia Regional Block:  Femoral nerve block  Pre-Anesthetic Checklist: ,, timeout performed, Correct Patient, Correct Site, Correct Laterality, Correct Procedure, Correct Position, site marked, Risks and benefits discussed,  Surgical consent,  Pre-op evaluation,  At surgeon's request and post-op pain management  Laterality: Right  Prep: Dura Prep       Needles:   Needle Type: Stimiplex          Additional Needles:  Procedures: ultrasound guided and nerve stimulator Femoral nerve block  Nerve Stimulator or Paresthesia:  Response: 0.5 mA,   Additional Responses:   Narrative:  Anesthesiologist: Lawerence Dery  Additional Notes: No pain on injection. No increased resistance to injection. Motor intact immediately after block.

## 2011-02-04 NOTE — Transfer of Care (Signed)
Immediate Anesthesia Transfer of Care Note  Patient: Manuel Hood  Procedure(s) Performed:  TOTAL KNEE ARTHROPLASTY  Patient Location: PACU  Anesthesia Type: General  Level of Consciousness: awake, alert , oriented and patient cooperative  Airway & Oxygen Therapy: Patient Spontanous Breathing and Patient connected to face mask oxygen  Post-op Assessment: Report given to PACU RN and Post -op Vital signs reviewed and stable  Post vital signs: Reviewed and stable  Complications: No apparent anesthesia complications

## 2011-02-04 NOTE — Brief Op Note (Signed)
02/04/2011  9:51 AM  PATIENT:  Manuel Hood  61 y.o. male  PRE-OPERATIVE DIAGNOSIS:  degenerative arthritis disease right knee   POST-OPERATIVE DIAGNOSIS:  degenerative arthritis disease right knee   PROCEDURE:  Procedure(s): TOTAL KNEE ARTHROPLASTY  SURGEON:  Surgeon(s): Javier Docker  PHYSICIAN ASSISTANT:   ASSISTANTS: Strader     ANESTHESIA:   general  EBL:  Total I/O In: 2000 [I.V.:2000] Out: 205 [Urine:200; Blood:5]  BLOOD ADMINISTERED:none  DRAINS: (1) Hemovact drain(s) in the knee with  Suction Open   LOCAL MEDICATIONS USED:  MARCAINE 25CC  SPECIMEN:  No Specimen  DISPOSITION OF SPECIMEN:  N/A  COUNTS:  YES  TOURNIQUET:   Total Tourniquet Time Documented: Thigh (Right) - 98 minutes  DICTATION: . Dictation number [id number]  PLAN OF CARE: Admit to inpatient   PATIENT DISPOSITION:  PACU - hemodynamically stable.   Delay start of Pharmacological VTE agent (>24hrs) due to surgical blood loss or risk of bleeding:  {YES/NO/NOT APPLICABLE:20182

## 2011-02-04 NOTE — Anesthesia Preprocedure Evaluation (Addendum)
Anesthesia Evaluation  Patient identified by MRN, date of birth, ID band Patient awake    Reviewed: Allergy & Precautions, H&P , NPO status , Patient's Chart, lab work & pertinent test results  Airway Mallampati: II TM Distance: >3 FB Neck ROM: Full    Dental No notable dental hx.    Pulmonary neg pulmonary ROS, former smoker clear to auscultation  Pulmonary exam normal       Cardiovascular hypertension, Pt. on medications neg cardio ROS Regular Normal    Neuro/Psych  Headaches, Negative Neurological ROS  Negative Psych ROS   GI/Hepatic negative GI ROS, Neg liver ROS,   Endo/Other  Negative Endocrine ROS  Renal/GU negative Renal ROS  Genitourinary negative   Musculoskeletal negative musculoskeletal ROS (+)   Abdominal (+) obese,   Peds negative pediatric ROS (+)  Hematology negative hematology ROS (+)   Anesthesia Other Findings   Reproductive/Obstetrics negative OB ROS                           Anesthesia Physical Anesthesia Plan  ASA: II  Anesthesia Plan: General   Post-op Pain Management:    Induction: Intravenous  Airway Management Planned: Oral ETT  Additional Equipment:   Intra-op Plan:   Post-operative Plan: Extubation in OR  Informed Consent: I have reviewed the patients History and Physical, chart, labs and discussed the procedure including the risks, benefits and alternatives for the proposed anesthesia with the patient or authorized representative who has indicated his/her understanding and acceptance.   Dental advisory given  Plan Discussed with: CRNA  Anesthesia Plan Comments: (Dr. Shelle Iron requests femoral nerve block. Pt. Consents.)      Anesthesia Quick Evaluation

## 2011-02-04 NOTE — Preoperative (Signed)
Beta Blockers   Reason not to administer Beta Blockers:Not Applicable 

## 2011-02-04 NOTE — Anesthesia Postprocedure Evaluation (Signed)
  Anesthesia Post-op Note  Patient: Manuel Hood  Procedure(s) Performed:  TOTAL KNEE ARTHROPLASTY  Patient Location: PACU  Anesthesia Type: General  Level of Consciousness: awake and alert   Airway and Oxygen Therapy: Patient Spontanous Breathing  Post-op Pain: mild  Post-op Assessment: Post-op Vital signs reviewed, Patient's Cardiovascular Status Stable, Respiratory Function Stable, Patent Airway and No signs of Nausea or vomiting  Post-op Vital Signs: stable  Complications: No apparent anesthesia complications

## 2011-02-05 LAB — CBC
HCT: 39.7 % (ref 39.0–52.0)
Hemoglobin: 13.4 g/dL (ref 13.0–17.0)
MCH: 32.2 pg (ref 26.0–34.0)
MCHC: 33.8 g/dL (ref 30.0–36.0)
MCV: 95.4 fL (ref 78.0–100.0)

## 2011-02-05 LAB — BASIC METABOLIC PANEL
BUN: 11 mg/dL (ref 6–23)
Calcium: 9 mg/dL (ref 8.4–10.5)
GFR calc non Af Amer: >90 mL/min (ref >90)
Glucose, Bld: 133 mg/dL — ABNORMAL HIGH (ref 70–99)

## 2011-02-05 MED ORDER — METHOCARBAMOL 500 MG PO TABS: 500.0000 mg | ORAL_TABLET | Freq: Four times a day (QID) | ORAL | Status: DC | PRN

## 2011-02-05 MED ORDER — RIVAROXABAN 10 MG PO TABS: 10.0000 mg | ORAL_TABLET | Freq: Every day | ORAL | Status: DC

## 2011-02-05 NOTE — Progress Notes (Signed)
Subjective: 1 Day Post-Op Procedure(s) (LRB): TOTAL KNEE ARTHROPLASTY (Right) Patient reports pain as 3 on 0-10 scale.  Denies CP or SOB  Patient has no complaints   We will start therapy today. Plan is to go home after hospital stay.  Objective: Vital signs in last 24 hours: Temp:  [97.5 F (36.4 C)-98.7 F (37.1 C)] 98.6 F (37 C) (01/04 0514) Pulse Rate:  [74-118] 84  (01/04 0514) Resp:  [13-19] 18  (01/04 0514) BP: (109-145)/(68-93) 145/93 mmHg (01/04 0514) SpO2:  [94 %-100 %] 97 % (01/04 0514) Weight:  [96.163 kg (212 lb)] 212 lb (96.163 kg) (01/03 1350)  Intake/Output from previous day:  Intake/Output Summary (Last 24 hours) at 02/05/11 0806 Last data filed at 02/05/11 0805  Gross per 24 hour  Intake 5268.2 ml  Output   2585 ml  Net 2683.2 ml    Intake/Output this shift: Total I/O In: 240 [P.O.:240] Out: -   Labs: Results for orders placed during the hospital encounter of 02/04/11  TYPE AND SCREEN      Component Value Range   ABO/RH(D) O POS     Antibody Screen NEG     Sample Expiration 02/07/2011    ABO/RH      Component Value Range   ABO/RH(D) O POS    CBC      Component Value Range   WBC 13.7 (*) 4.0 - 10.5 (K/uL)   RBC 4.16 (*) 4.22 - 5.81 (MIL/uL)   Hemoglobin 13.4  13.0 - 17.0 (g/dL)   HCT 13.2  44.0 - 10.2 (%)   MCV 95.4  78.0 - 100.0 (fL)   MCH 32.2  26.0 - 34.0 (pg)   MCHC 33.8  30.0 - 36.0 (g/dL)   RDW 72.5  36.6 - 44.0 (%)   Platelets 222  150 - 400 (K/uL)  BASIC METABOLIC PANEL      Component Value Range   Sodium 134 (*) 135 - 145 (mEq/L)   Potassium 4.0  3.5 - 5.1 (mEq/L)   Chloride 101  96 - 112 (mEq/L)   CO2 26  19 - 32 (mEq/L)   Glucose, Bld 133 (*) 70 - 99 (mg/dL)   BUN 11  6 - 23 (mg/dL)   Creatinine, Ser 3.47  0.50 - 1.35 (mg/dL)   Calcium 9.0  8.4 - 42.5 (mg/dL)   GFR calc non Af Amer >90  >90 (mL/min)   GFR calc Af Amer >90  >90 (mL/min)    Exam - Neurologically intact Neurovascular intact Sensation intact  distally Dorsiflexion/Plantar flexion intact Incision: dressing C/D/I Compartment soft Dressing - clean, dry, no drainage Motor function intact - moving foot and toes well on exam.  Hemovac pulled without difficulty.  Assessment/Plan: 1 Day Post-Op Procedure(s) (LRB): TOTAL KNEE ARTHROPLASTY (Right)  Up with therapy D/C IV fluids Plan for discharge tomorrow Past Medical History  Diagnosis Date  . Hypertension   . Sinus pain   . Sleeping difficulties     DVT Prophylaxis - Xarelto Weight-Bearing as tolerated to right leg D/c foley Likely will be ready for d/c tomorrow if does well with PT Instructions discussed with pt D/c PCA later today, KVO IV, d/c O2 No vaccines.  Shariff Lasky R. 02/05/2011, 8:06 AM

## 2011-02-05 NOTE — Op Note (Signed)
NAMEFILIMON, MIRANDA                 ACCOUNT NO.:  [id number]  MEDICAL RECORD NO.:  [id number]  LOCATION:  1601                         FACILITY:  [facility]  PHYSICIAN:  Jene Every, M.D.    DATE OF BIRTH:  Nov 18, 1950  DATE OF PROCEDURE: DATE OF DISCHARGE:                              OPERATIVE REPORT   PREOPERATIVE DIAGNOSIS:  Degenerative joint disease, right knee.  POSTOPERATIVE DIAGNOSIS:  Degenerative joint disease, right knee.  PROCEDURE PERFORMED:  Right total knee arthroplasty.  ANESTHESIA:  General.  ASSISTANT:  Roma Schanz, PA  COMPONENTS:  DePuy rotating platform, 5 femur, 5 tibia, 10 mm insert, 41 patella.  HISTORY:  This is a gentleman with end-stage osteoarthrosis of the right knee, bone on bone, grade 4 changes, refractory to conservative treatment, indicated for replacement of the degenerative joint.  Risks and benefits were discussed including bleeding, infection, damage to vascular structures, no change in symptoms, worsening symptoms, need for repeat revision, DVT, PE, anesthetic complication, loosening, need for manipulation etc.  TECHNIQUE:  With the patient in supine position, after induction of adequate general anesthesia, 2 g Kefzol, the right lower extremity was prepped and draped in the usual sterile fashion.  Thigh tourniquet was inflated to 300 mmHg.  The knee was slightly flexed.  A midline incision was made, full thickness flaps were developed.  Medial and parapatellar arthrotomy was performed.  The patella was everted, knee flexed after we released the superficial tissues medially.  We removed the medial and lateral menisci remnants and the ACL.  Tricompartmental osteoarthrosis was noted, particularly in the medial compartment, grade 4 changes. Step drill was utilized to enter the femoral canal.  The knee was flexed and irrigated 5 degree right.  With flexion contracture we used 11 mm off the distal femur.  This was cut and we sized off the  distal femur and the anterior cortex to a 5.  We then pinned in 3 degrees of external rotation.  Anterior and posterior chamfer cuts were then performed.  The soft tissue was protected posteriorly at all times.  Attention was turned towards the tibia, measured off the high side, which was laterally thin and then required an additional 2 mm with pin.  The tibial guide after external alignment guide was utilized, bisected the ankle joint parallel to the tibia for the slope, and we performed a proximal tibial cut.  We then used our flexion extension blocks and they were equivalent.  We checked posteriorly, any remnants of menisci were then removed, and the knee flexed.  We then turned our attention back towards completing the tibia where  the tibia subluxed a retractor was placed.  We sized the tibia to a 5 maximal coverage, distal and medial aspect of the medial 3rd of the tibial tubercle.  This was then pinned. Osteophytes were removed.  Central drill was used as well as the punch guide.  We turned attention towards completing the femur.  We used the femoral box cut jig and box cut was then performed.  We used a trial femur, trial tibia, 10 mm insert.  We had full extension, full flexion, good stability, varus valgus stressing 0-30 degrees.  Good patellofemoral  tracking.  We completed the patella that was everted, sized to 41.  We used a patellar planer to a 15, 14.  We drilled peg holes, medializing the component, trial placed.  We had good patellofemoral tracking.  Following the testing, we removed all trials. Used pulsatile lavage.  Checked all compartments.  No residual soft tissue amenable to removal.  The knee was then flexed, all surfaces dried thoroughly.  The cement was mixed on the back table in appropriate fashion, injecting the proximal tibia.  Digitally pressurized and impacted the 5 tibial tray.  Then impacted and cemented the 5 femoral component, 10 mm insert placed,  reduced, held in extension with an axial load of 5 throughout the curing of the cement, cemented the patellar clamp as well.  After curing of the cement, redundant cement was removed.  The 10 was optimal in terms of full extension, full flexion, good stability, varus valgus stressing 0-30 degrees.  We, therefore, placed a permanent 10 insert rotating platform, checking again to remove any redundant cement.  It was copiously irrigated with antibiotic irrigation prior to this.  We placed a permanent trial, again full extension, full flexion, good stability, varus valgus stressing 0-30 degrees and no instability.  Next, I placed a Hemovac, brought it out through a lateral stab wound in the skin.  We anesthetized the periosteum with 0.25% Marcaine.  The patellar arthrotomy was then reapproximated with #1 Vicryl interrupted figure-of-eight sutures in slight flexion, subcutaneous with 0 and 2-0 Vicryl simple sutures.  Skin was re-approximated with staples.  He had flexion to gravity at 90 degrees.  The tourniquet was deflated and there was adequate revascularization of lower extremity appreciated.  A sterile dressing had been applied.  The patient tolerated the procedure well with no complications. Assistant was [employer].  Tourniquet time was 98 minutes.     Jene Every, M.D.     Cordelia Pen  D:  02/04/2011  T:  02/05/2011  Job:  [id number]

## 2011-02-05 NOTE — Progress Notes (Signed)
Physical Therapy Treatment Patient Details Name: Manuel Hood MRN: [id number] DOB: May 24, 1950 Today's Date: 02/05/2011 1200-1224 1 gt, 1 te  PT Assessment/Plan  PT - Assessment/Plan Comments on Treatment Session: pt progressing well, pain controlled PT Plan: Discharge plan remains appropriate;Frequency remains appropriate PT Frequency: 7X/week Follow Up Recommendations: Home health PT Equipment Recommended: Rolling walker with 5" wheels PT Goals  Acute Rehab PT Goals PT Goal Formulation: With patient Time For Goal Achievement: 7 days  Pt will go Sit to Supine/Side: with supervision PT Goal: Sit to Supine/Side - Progress: Progressing toward goal Pt will go Sit to Stand: with modified independence PT Goal: Sit to Stand - Progress: Progressing toward goal Pt will go Stand to Sit: with modified independence PT Goal: Stand to Sit - Progress: Progressing toward goal Pt will Ambulate: >150 feet;with modified independence;with least restrictive assistive device PT Goal: Ambulate - Progress: Progressing toward goal Pt will Perform Home Exercise Program: with supervision, verbal cues required/provided PT Goal: Perform Home Exercise Program - Progress: Progressing toward goal  PT Treatment Precautions/Restrictions  Precautions Precautions: Knee Precaution Comments: no pillow under knee Required Braces or Orthoses: Yes Knee Immobilizer: Discontinue once straight leg raise with < 10 degree lag Restrictions Weight Bearing Restrictions: No RLE Weight Bearing: Weight bearing as tolerated Mobility (including Balance) Bed Mobility Sit to Supine - Right: 4: Min assist;HOB flat Sit to Supine - Right Details (indicate cue type and reason): cues for technique  Transfers Sit to Stand:  (min/guard) Sit to Stand Details (indicate cue type and reason): min/guard. cues for hand placement Stand to Sit: 4: Min assist;With upper extremity assist;To bed Stand to Sit Details: cues for hands and to  back up to surface  Ambulation/Gait Ambulation/Gait: Yes Ambulation/Gait Assistance:  (min/guard) Ambulation/Gait Assistance Details (indicate cue type and reason): min/guard; cues for sequence and increase Wt on right LE Ambulation Distance (Feet): 130 Feet Assistive device: Rolling walker Gait Pattern: Step-to pattern;Antalgic    Exercise  Total Joint Exercises  Quad Sets: AROM;Right;10 reps Short Arc Quad: AAROM;AROM;Right;10 reps Heel Slides: AROM;AAROM;Right;10 reps Hip ABduction/ADduction: AROM;AAROM;Right;10 reps Straight Leg Raises: AROM;AAROM;Right;10 reps End of Session PT - End of Session Equipment Utilized During Treatment: Right knee immobilizer Activity Tolerance: Patient tolerated treatment well Patient left: in bed Nurse Communication: Mobility status for transfers;Mobility status for ambulation General Behavior During Session: [facility] for tasks performed Cognition: [facility] for tasks performed  [facility] 02/05/2011, 12:48 PM

## 2011-02-05 NOTE — Progress Notes (Signed)
Physical Therapy Evaluation Patient Details Name: NOUR SCALISE MRN: [id number] DOB: 09/01/1950 Today's Date: 02/05/2011 [id number] eval 2  Problem List: There is no problem list on file for this patient.   Past Medical History:  Past Medical History  Diagnosis Date  . Hypertension   . Sinus pain   . Sleeping difficulties    Past Surgical History:  Past Surgical History  Procedure Date  . Knee arthroscopy rt knee x3  . Rotator cuff repair rt shoulder  . Back surgery     x2 (cervical and lumbar)  . Appendectomy     PT Assessment/Plan/Recommendation PT Assessment Clinical Impression Statement: Pt s/p right TKA and will benefit from PT to maximize independence for home setting. wife will be with pt for 2 wks if needed. PT Recommendation/Assessment: Patient will need skilled PT in the acute care venue PT Problem List: Decreased range of motion;Decreased strength;Decreased activity tolerance;Decreased mobility;Decreased knowledge of use of DME;Pain Barriers to Discharge: None PT Therapy Diagnosis : Difficulty walking PT Plan PT Frequency: 7X/week PT Treatment/Interventions: DME instruction;Gait training;Stair training;Functional mobility training;Therapeutic activities;Therapeutic exercise;Patient/family education PT Recommendation Follow Up Recommendations: Home health PT Equipment Recommended: Rolling walker with 5" wheels (unless pt decides to use crutches); or borrows RW PT Goals  Acute Rehab PT Goals PT Goal Formulation: With patient Time For Goal Achievement: 7 days Pt will go Supine/Side to Sit: with supervision;with HOB 0 degrees PT Goal: Supine/Side to Sit - Progress: Progressing toward goal Pt will go Sit to Supine/Side: with supervision PT Goal: Sit to Supine/Side - Progress: Not met Pt will go Sit to Stand: with modified independence PT Goal: Sit to Stand - Progress: Progressing toward goal Pt will go Stand to Sit: with modified independence PT Goal: Stand to  Sit - Progress: Progressing toward goal Pt will Ambulate: >150 feet;with modified independence;with least restrictive assistive device PT Goal: Ambulate - Progress: Progressing toward goal Pt will Go Up / Down Stairs: 6-9 stairs PT Goal: Up/Down Stairs - Progress: Not met Pt will Perform Home Exercise Program: with supervision, verbal cues required/provided PT Goal: Perform Home Exercise Program - Progress: Not met  PT Evaluation Precautions/Restrictions  Precautions Precautions: Knee Precaution Comments: no pillow under knee Required Braces or Orthoses: Yes Knee Immobilizer: Discontinue once straight leg raise with < 10 degree lag Restrictions RLE Weight Bearing: Weight bearing as tolerated Prior Functioning  Home Living Lives With: Spouse Receives Help From:  (wife for 2 wks) Type of Home: House Home Layout: One level Home Access: Stairs to enter Entrance Stairs-Rails: Right Entrance Stairs-Number of Steps: 6 Home Adaptive Equipment: Crutches Prior Function Level of Independence: Independent with basic ADLs;Independent with gait Able to Take Stairs?: Yes Driving: Yes Cognition Cognition Arousal/Alertness: Awake/alert Overall Cognitive Status: Appears within functional limits for tasks assessed Orientation Level: Oriented X4 Sensation/Coordination Sensation Light Touch: Appears Intact (LEs) Extremity Assessment RUE Assessment RUE Assessment: Within Functional Limits LUE Assessment LUE Assessment: Within Functional Limits RLE Assessment RLE Assessment:  (ankle WFL, able to assist with SLR) LLE Assessment LLE Assessment: Within Functional Limits Mobility (including Balance) Bed Mobility Supine to Sit: 4: Min assist Supine to Sit Details (indicate cue type and reason): cues for technique Transfers Sit to Stand: 4: Min assist;From bed;With upper extremity assist Sit to Stand Details (indicate cue type and reason): min/guard. cues for hand placement Stand to Sit: 4:  Min assist;To chair/3-in-1 Stand to Sit Details: cues for hands and to back up to surface Ambulation/Gait Ambulation/Gait Assistance: 4: Min assist Ambulation/Gait Assistance  Details (indicate cue type and reason): min/guard; cues for sequence  and  increase Wt on right LE Ambulation Distance (Feet): 125 Feet Assistive device: Rolling walker Gait Pattern: Step-to pattern;Antalgic    Exercise  Total Joint Exercises Ankle Circles/Pumps: AROM;Both;10 reps End of Session PT - End of Session Equipment Utilized During Treatment: Right knee immobilizer Activity Tolerance: Patient tolerated treatment well Patient left: in chair;with call bell in reach Nurse Communication: Mobility status for transfers;Mobility status for ambulation General Behavior During Session: [facility] for tasks performed Cognition: [facility] for tasks performed  [facility] 02/05/2011, 10:55 AM

## 2011-02-05 NOTE — Progress Notes (Signed)
Chart reviewed and UR completed. 

## 2011-02-05 NOTE — Progress Notes (Signed)
02/05/2011 Raynelle Bring BSN CCM [phone] Pt admitted with dx degenerative arthritis right knee; total knee replacemnt on admission CM spoke with patient. Plans are for discharge to home in [place], [place] where spouse will be caregiver. States he already has crutches, cane and RW. He will have family member bring RW to hospital be inspected by PT.  Pt wants Genevieve Norlander for [facility] services if needed. CM will follow for orders

## 2011-02-06 LAB — BASIC METABOLIC PANEL
Calcium: 9.1 mg/dL (ref 8.4–10.5)
GFR calc non Af Amer: >90 mL/min (ref >90)
Glucose, Bld: 112 mg/dL — ABNORMAL HIGH (ref 70–99)
Sodium: 135 mEq/L (ref 135–145)

## 2011-02-06 LAB — CBC
Hemoglobin: 13.1 g/dL (ref 13.0–17.0)
MCH: 32.5 pg (ref 26.0–34.0)
MCHC: 33.8 g/dL (ref 30.0–36.0)
Platelets: 204 10*3/uL (ref 150–400)
RDW: 12.3 % (ref 11.5–15.5)

## 2011-02-06 MED ORDER — METHOCARBAMOL 500 MG PO TABS: 500.0000 mg | ORAL_TABLET | Freq: Four times a day (QID) | ORAL | Status: AC | PRN

## 2011-02-06 MED ORDER — OXYCODONE-ACETAMINOPHEN 5-325 MG PO TABS: 1.0000 | ORAL_TABLET | ORAL | Status: AC | PRN

## 2011-02-06 MED ORDER — OXYCODONE HCL 5 MG PO TABS
5.0000 mg | ORAL_TABLET | ORAL | Status: DC | PRN
  Administered 2011-02-06: 5 mg via ORAL
  Filled 2011-02-06: qty 2

## 2011-02-06 MED ORDER — RIVAROXABAN 10 MG PO TABS: 10.0000 mg | ORAL_TABLET | Freq: Every day | ORAL | Status: DC

## 2011-02-06 NOTE — Progress Notes (Signed)
Subjective: 2 Days Post-Op Procedure(s) (LRB): TOTAL KNEE ARTHROPLASTY (Right) Patient reports pain as controlled..   Patient seen in rounds with Dr. Lequita Halt. Patient is doing well and wants to go home.  Did great great with therapy yesterday walking over 125 feet twice.  Plan home later this morning after therapy.  Objective: Vital signs in last 24 hours: Temp:  [98.3 F (36.8 C)-100.4 F (38 C)] 99.1 F (37.3 C) (01/05 0503) Pulse Rate:  [82-101] 92  (01/05 0503) Resp:  [18] 18  (01/05 0503) BP: (124-150)/(67-83) 124/82 mmHg (01/05 0503) SpO2:  [93 %-96 %] 93 % (01/05 0503)  Intake/Output from previous day:  Intake/Output Summary (Last 24 hours) at 02/06/11 0748 Last data filed at 02/06/11 0504  Gross per 24 hour  Intake 2278.67 ml  Output   1650 ml  Net 628.67 ml    Intake/Output this shift:    Labs: Results for orders placed during the hospital encounter of 02/04/11  TYPE AND SCREEN      Component Value Range   ABO/RH(D) O POS     Antibody Screen NEG     Sample Expiration 02/07/2011    ABO/RH      Component Value Range   ABO/RH(D) O POS    CBC      Component Value Range   WBC 13.7 (*) 4.0 - 10.5 (K/uL)   RBC 4.16 (*) 4.22 - 5.81 (MIL/uL)   Hemoglobin 13.4  13.0 - 17.0 (g/dL)   HCT 95.6  21.3 - 08.6 (%)   MCV 95.4  78.0 - 100.0 (fL)   MCH 32.2  26.0 - 34.0 (pg)   MCHC 33.8  30.0 - 36.0 (g/dL)   RDW 57.8  46.9 - 62.9 (%)   Platelets 222  150 - 400 (K/uL)  BASIC METABOLIC PANEL      Component Value Range   Sodium 134 (*) 135 - 145 (mEq/L)   Potassium 4.0  3.5 - 5.1 (mEq/L)   Chloride 101  96 - 112 (mEq/L)   CO2 26  19 - 32 (mEq/L)   Glucose, Bld 133 (*) 70 - 99 (mg/dL)   BUN 11  6 - 23 (mg/dL)   Creatinine, Ser 5.28  0.50 - 1.35 (mg/dL)   Calcium 9.0  8.4 - 41.3 (mg/dL)   GFR calc non Af Amer >90  >90 (mL/min)   GFR calc Af Amer >90  >90 (mL/min)  CBC      Component Value Range   WBC 11.1 (*) 4.0 - 10.5 (K/uL)   RBC 4.03 (*) 4.22 - 5.81 (MIL/uL)   Hemoglobin 13.1  13.0 - 17.0 (g/dL)   HCT 24.4 (*) 01.0 - 52.0 (%)   MCV 96.3  78.0 - 100.0 (fL)   MCH 32.5  26.0 - 34.0 (pg)   MCHC 33.8  30.0 - 36.0 (g/dL)   RDW 27.2  53.6 - 64.4 (%)   Platelets 204  150 - 400 (K/uL)  BASIC METABOLIC PANEL      Component Value Range   Sodium 135  135 - 145 (mEq/L)   Potassium 3.6  3.5 - 5.1 (mEq/L)   Chloride 102  96 - 112 (mEq/L)   CO2 24  19 - 32 (mEq/L)   Glucose, Bld 112 (*) 70 - 99 (mg/dL)   BUN 11  6 - 23 (mg/dL)   Creatinine, Ser 0.34  0.50 - 1.35 (mg/dL)   Calcium 9.1  8.4 - 74.2 (mg/dL)   GFR calc non Af Amer >90  >90 (  mL/min)   GFR calc Af Amer >90  >90 (mL/min)    Exam: Neurovascular intact Sensation intact distally Incision - clean, dry, no drainage Motor function intact - moving foot and toes well on exam.   Assessment/Plan: 2 Days Post-Op Procedure(s) (LRB): TOTAL KNEE ARTHROPLASTY (Right) Up with therapy Discharge home with home health Procedure(s) (LRB): TOTAL KNEE ARTHROPLASTY (Right) Past Medical History  Diagnosis Date  . Hypertension   . Sinus pain   . Sleeping difficulties     Diet - heart healthy Follow up - in 2 weeks Activity - WBAT Condition Upon Discharge - Good D/C Meds - Percocet,robaxinnn DVT Prophylaxis - Xarelto  PERKINS, ALEXZANDREW 02/06/2011, 7:48 AM

## 2011-02-06 NOTE — Progress Notes (Signed)
Discharged from floor via w/c, wife with pt. No changes in assessment. Manuel Hood  

## 2011-02-06 NOTE — Discharge Summary (Signed)
Physician Discharge Summary   Patient ID: Manuel Hood MRN: [id number] DOB/AGE: Apr 05, 1950 61 y.o.  Admit date: 02/04/2011 Discharge date: 02/06/2011  Primary Diagnosis: End stage arthritis, right knee  Admission Diagnoses: Past Medical History  Diagnosis Date  . Hypertension   . Sinus pain   . Sleeping difficulties     Discharge Diagnoses:  Postop Hyponatremia  Procedure: Procedure(s) (LRB): TOTAL KNEE ARTHROPLASTY (Right)   Consults: none  HPI: This is a gentleman with end-stage osteoarthrosis of the right  knee, bone on bone, grade 4 changes, refractory to conservative  treatment, indicated for replacement of the degenerative joint. Risks  and benefits were discussed including bleeding, infection, damage to vascular structures, no change in symptoms, worsening symptoms, need for repeat revision, DVT, PE, anesthetic complication, loosening, need for manipulation etc.   Laboratory Data: Hospital Outpatient Visit on 01/29/2011  Component Date Value Range Status  . WBC (K/uL) 01/29/2011 8.5  4.0-10.5 Final  . RBC (MIL/uL) 01/29/2011 5.02  4.22-5.81 Final  . Hemoglobin (g/dL) 44/02/270 53.6  64.4-03.4 Final  . HCT (%) 01/29/2011 46.9  39.0-52.0 Final  . MCV (fL) 01/29/2011 93.4  78.0-100.0 Final  . MCH (pg) 01/29/2011 33.5  26.0-34.0 Final  . MCHC (g/dL) 74/25/9563 87.5  64.3-32.9 Final  . RDW (%) 01/29/2011 12.1  11.5-15.5 Final  . Platelets (K/uL) 01/29/2011 233  150-400 Final  . Sodium (mEq/L) 01/29/2011 140  135-145 Final  . Potassium (mEq/L) 01/29/2011 4.1  3.5-5.1 Final  . Chloride (mEq/L) 01/29/2011 103  96-112 Final  . CO2 (mEq/L) 01/29/2011 28  19-32 Final  . Glucose, Bld (mg/dL) 51/88/4166 94  06-30 Final  . BUN (mg/dL) 16/02/930 18  3-55 Final  . Creatinine, Ser (mg/dL) 73/22/0254 2.70  6.23-7.62 Final  . Calcium (mg/dL) 83/15/1761 60.7* 3.7-10.6 Final  . Total Protein (g/dL) 26/94/8546 7.5  2.7-0.3 Final  . Albumin (g/dL) 50/10/3816 4.0  2.9-9.3 Final    . AST (U/L) 01/29/2011 21  0-37 Final  . ALT (U/L) 01/29/2011 41  0-53 Final  . Alkaline Phosphatase (U/L) 01/29/2011 102  39-117 Final  . Total Bilirubin (mg/dL) 71/69/6789 0.3  3.8-1.0 Final  . GFR calc non Af Amer (mL/min) 01/29/2011 79* >90 Final  . GFR calc Af Amer (mL/min) 01/29/2011 >90  >90 Final   Comment:                                 The eGFR has been calculated                          using the CKD EPI equation.                          This calculation has not been                          validated in all clinical                          situations.                          eGFR's persistently                          <90 mL/min signify  possible Chronic Kidney Disease.  Marland Kitchen Neutrophils Relative (%) 01/29/2011 68  43-77 Final  . Neutro Abs (K/uL) 01/29/2011 5.7  1.7-7.7 Final  . Lymphocytes Relative (%) 01/29/2011 19  12-46 Final  . Lymphs Abs (K/uL) 01/29/2011 1.6  0.7-4.0 Final  . Monocytes Relative (%) 01/29/2011 8  3-12 Final  . Monocytes Absolute (K/uL) 01/29/2011 0.7  0.1-1.0 Final  . Eosinophils Relative (%) 01/29/2011 6* 0-5 Final  . Eosinophils Absolute (K/uL) 01/29/2011 0.5  0.0-0.7 Final  . Basophils Relative (%) 01/29/2011 0  0-1 Final  . Basophils Absolute (K/uL) 01/29/2011 0.0  0.0-0.1 Final  . Prothrombin Time (seconds) 01/29/2011 13.4  11.6-15.2 Final  . INR  01/29/2011 1.00  0.00-1.49 Final  . Color, Urine  01/29/2011 YELLOW  YELLOW Final  . APPearance  01/29/2011 CLEAR  CLEAR Final  . Specific Gravity, Urine  01/29/2011 1.014  1.005-1.030 Final  . pH  01/29/2011 6.5  5.0-8.0 Final  . Glucose, UA (mg/dL) 16/11/9602 NEGATIVE  NEGATIVE Final  . Hgb urine dipstick  01/29/2011 NEGATIVE  NEGATIVE Final  . Bilirubin Urine  01/29/2011 NEGATIVE  NEGATIVE Final  . Ketones, ur (mg/dL) 54/10/8117 NEGATIVE  NEGATIVE Final  . Protein, ur (mg/dL) 14/78/2956 NEGATIVE  NEGATIVE Final  . Urobilinogen, UA (mg/dL) 21/30/8657 0.2  8.4-6.9  Final  . Nitrite  01/29/2011 NEGATIVE  NEGATIVE Final  . Leukocytes, UA  01/29/2011 NEGATIVE  NEGATIVE Final   MICROSCOPIC NOT DONE ON URINES WITH NEGATIVE PROTEIN, BLOOD, LEUKOCYTES, NITRITE, OR GLUCOSE <1000 mg/dL.  Marland Kitchen aPTT (seconds) 01/29/2011 28  24-37 Final  . MRSA, PCR  01/29/2011 NEGATIVE  NEGATIVE Final  . Staphylococcus aureus  01/29/2011 POSITIVE* NEGATIVE Final   Comment:                                 The Xpert SA Assay (FDA                          approved for NASAL specimens                          only), is one component of                          a comprehensive surveillance                          program.  It is not intended                          to diagnose infection nor to                          guide or monitor treatment.    Basename 02/06/11 0415 02/05/11 0423  HGB 13.1 13.4    Basename 02/06/11 0415 02/05/11 0423  WBC 11.1* 13.7*  RBC 4.03* 4.16*  HCT 38.8* 39.7  PLT 204 222    Basename 02/06/11 0415 02/05/11 0423  NA 135 134*  K 3.6 4.0  CL 102 101  CO2 24 26  BUN 11 11  CREATININE 0.85 0.78  GLUCOSE 112* 133*  CALCIUM 9.1 9.0   No results found for this basename: LABPT:2,INR:2 in the last 72 hours  X-Rays: Chest 2 View  01/29/2011  *RADIOLOGY REPORT*  Clinical Data: Preoperative respiratory examination for total knee replacement.  CHEST - 2 VIEW  Comparison: 02/19/2009 radiographs.  Findings: The heart size and mediastinal contours are normal. The lungs are clear. There is no pleural effusion or pneumothorax. No acute osseous findings are identified.  IMPRESSION: Stable examination.  No active cardiopulmonary process.  Original Report Authenticated By: Gerrianne Scale, M.D.   Dg Knee 1-2 Views Right  01/29/2011  *RADIOLOGY REPORT*  Clinical Data: Preoperative exam for total knee replacement.  RIGHT KNEE - 1-2 VIEW  Comparison: None.  Findings: There is a small knee joint effusion.  There are small patellofemoral osteophytes.  No other  focal finding.  IMPRESSION: Small effusion and patellofemoral osteophytes.  Original Report Authenticated By: Thomasenia Sales, M.D.   X-ray Knee Right Port  02/04/2011  *RADIOLOGY REPORT*  Clinical Data: Total knee arthroplasty.  PORTABLE RIGHT KNEE - 1-2 VIEW  Comparison: 01/29/2011.  Findings: The tibial and femoral components are well seated.  No complicating features.  IMPRESSION: Well seated components of a total right knee arthroplasty.  Original Report Authenticated By: P. Loralie Champagne, M.D.    EKG: Orders placed during the hospital encounter of 11/09/10  . EKG     Hospital Course: Patient was admitted to [facility] and taken to the OR and underwent the above state procedure without complications.  Patient tolerated the procedure well and was later transferred to the recovery room and then to the orthopaedic floor for postoperative care.  They were given PO and IV analgesics for pain control following their surgery.  They were given 24 hours of postoperative antibiotics and started on DVT prophylaxis.   PT and OT were ordered for total joint protocol.  Discharge planning consulted to help with postop disposition and equipment needs.  Patient had a decent night on the evening of surgery and started to get up with therapy on day one.  PCA was discontinued and they were weaned over to PO meds.  Hemovac drain was pulled without difficulty.  Continued to progress with therapy into day two. He walked well on day one and by day two he was doing well and wanted to go home.  Discharge Medications: Prior to Admission medications   Medication Sig Start Date End Date Taking? Authorizing Provider  telmisartan-hydrochlorothiazide (MICARDIS HCT) 80-12.5 MG per tablet Take 1 tablet by mouth every morning.     Yes Historical Provider, MD  calcium carbonate (OS-CAL) 600 MG TABS Take 600 mg by mouth daily.      Historical Provider, MD  cholecalciferol (VITAMIN D) 1000 UNITS tablet Take 1,000 Units by  mouth daily.      Historical Provider, MD  methocarbamol (ROBAXIN) 500 MG tablet Take 1 tablet (500 mg total) by mouth every 6 (six) hours as needed. 02/06/11 02/16/11  Alexzandrew Perkins, PA  Multiple Vitamins-Minerals (ONE-A-DAY 50 PLUS PO) Take 1 tablet by mouth daily.      Historical Provider, MD  oxyCODONE-acetaminophen (PERCOCET) 5-325 MG per tablet Take 1-2 tablets by mouth every 4 (four) hours as needed. 02/06/11 02/16/11  Alexzandrew Julien Girt, PA  rivaroxaban (XARELTO) 10 MG TABS tablet Take 1 tablet (10 mg total) by mouth daily with breakfast. 02/06/11   Alexzandrew Julien Girt, PA    Diet: heart healthy  Activity:WBAT   Follow-up:in 2 weeks  Disposition: home  Discharged Condition: good   Discharge Orders    Future Orders Please Complete By Expires   Diet - low sodium heart healthy  Diet - low sodium heart healthy      Call MD / Call 911      Comments:   If you experience chest pain or shortness of breath, CALL 911 and be transported to the hospital emergency room.  If you develope a fever above 101 F, pus (white drainage) or increased drainage or redness at the wound, or calf pain, call your surgeon's office.   Constipation Prevention      Comments:   Drink plenty of fluids.  Prune juice may be helpful.  You may use a stool softener, such as Colace (over the counter) 100 mg twice a day.  Use MiraLax (over the counter) for constipation as needed.   Increase activity slowly as tolerated      Weight Bearing as taught in Physical Therapy      Comments:   Use a walker or crutches as instructed.   Discharge instructions      Comments:   Use knee immobilizer until can SLR x 10 Elevate above heart level at least 6 x a day for 20 minutes Daily dressing change OK to shower     Driving restrictions      Comments:   No driving for 2 weeks   TED hose      Comments:   Use stockings (TED hose) for 4 weeks on both leg(s).  You may remove them at night for sleeping.   Change dressing       Comments:   change the dressing daily with sterile 4 x 4 inch gauze dressing and apply TED hose.   Call MD / Call 911      Comments:   If you experience chest pain or shortness of breath, CALL 911 and be transported to the hospital emergency room.  If you develope a fever above 101 F, pus (white drainage) or increased drainage or redness at the wound, or calf pain, call your surgeon's office.   Constipation Prevention      Comments:   Drink plenty of fluids.  Prune juice may be helpful.  You may use a stool softener, such as Colace (over the counter) 100 mg twice a day.  Use MiraLax (over the counter) for constipation as needed.   Increase activity slowly as tolerated      Weight Bearing as taught in Physical Therapy      Comments:   Use a walker or crutches as instructed.   Discharge instructions      Comments:   Pick up stool softner and laxative for home. Do not submerge incision under water. May shower. Continue to use ice for pain and swelling from surgery.    Driving restrictions      Comments:   No driving   Lifting restrictions      Comments:   No lifting   TED hose      Comments:   Use stockings (TED hose) for 3 weeks on both leg(s).  You may remove them at night for sleeping.   Change dressing      Comments:   Change dressing daily with sterile 4 x 4 inch gauze dressing and apply TED hose.   Do not put a pillow under the knee. Place it under the heel.        Current Discharge Medication List    START taking these medications   Details  methocarbamol (ROBAXIN) 500 MG tablet Take 1 tablet (500 mg total) by mouth every 6 (six) hours as needed. Qty: 80  tablet, Refills: 1    rivaroxaban (XARELTO) 10 MG TABS tablet Take 1 tablet (10 mg total) by mouth daily with breakfast. Qty: 18 tablet, Refills: 0      CONTINUE these medications which have CHANGED   Details  oxyCODONE-acetaminophen (PERCOCET) 5-325 MG per tablet Take 1-2 tablets by mouth every 4 (four) hours  as needed. Qty: 60 tablet, Refills: 0      CONTINUE these medications which have NOT CHANGED   Details  telmisartan-hydrochlorothiazide (MICARDIS HCT) 80-12.5 MG per tablet Take 1 tablet by mouth every morning.      calcium carbonate (OS-CAL) 600 MG TABS Take 600 mg by mouth daily.      cholecalciferol (VITAMIN D) 1000 UNITS tablet Take 1,000 Units by mouth daily.      Multiple Vitamins-Minerals (ONE-A-DAY 50 PLUS PO) Take 1 tablet by mouth daily.        STOP taking these medications     aspirin EC 81 MG tablet      ibuprofen (ADVIL,MOTRIN) 200 MG tablet      Red Yeast Rice 600 MG CAPS        Follow-up Information    Follow up with BEANE,JEFFREY C in 14 days.   Contact information:   [facility] [address] [place] [address] [phone]          Signed: Patrica Duel 02/06/2011, 9:52 AM

## 2011-02-06 NOTE — Progress Notes (Signed)
Physical Therapy Treatment Patient Details Name: Manuel Hood MRN: [id number] DOB: 1950-04-28 Today's Date: 02/06/2011 0930-1006 [id number] 2gt PT Assessment/Plan  PT - Assessment/Plan Comments on Treatment Session: pt upset he did not receive pain meds until 1.5 hrs after due. PT Plan: Discharge plan remains appropriate;Frequency remains appropriate Follow Up Recommendations: Home health PT Equipment Recommended: None recommended by PT  Spoke to pt, wife and CM regarding follow up PT as pt declined when CM spoke to him.  Pt and wife agreeable after our conversation. PT Goals  Acute Rehab PT Goals PT Goal: Supine/Side to Sit - Progress: Partly met PT Goal: Sit to Supine/Side - Progress: Not met PT Goal: Sit to Stand - Progress: Progressing toward goal PT Goal: Stand to Sit - Progress: Progressing toward goal PT Goal: Ambulate - Progress: Progressing toward goal PT Goal: Up/Down Stairs - Progress: Partly met PT Goal: Perform Home Exercise Program - Progress: Met  PT Treatment Precautions/Restrictions  Precautions Precautions: Knee Precaution Comments: no pillow under knee Required Braces or Orthoses: Yes Knee Immobilizer: Discontinue once straight leg raise with < 10 degree lag Restrictions Weight Bearing Restrictions: No RLE Weight Bearing: Weight bearing as tolerated Mobility (including Balance) Bed Mobility Supine to Sit: 4: Min assist Supine to Sit Details (indicate cue type and reason): min with RLE, cues for technique.  Pt instructed in use of sheet loop to self assist RLE Transfers Sit to Stand: 5: Supervision;From bed Sit to Stand Details (indicate cue type and reason): cues for hand placement Stand to Sit: 5: Supervision;With upper extremity assist;With armrests;To chair/3-in-1 Stand to Sit Details: cues for hands and to back up to surface Ambulation/Gait Ambulation/Gait Assistance: 5: Supervision Ambulation/Gait Assistance Details (indicate cue type and reason):  cues for RW distance from self, step length Ambulation Distance (Feet): 220 Feet (x2) Assistive device: Rolling walker Gait Pattern: Step-to pattern;Antalgic Stairs: Yes Stairs Assistance: 4: Min assist Stairs Assistance Details (indicate cue type and reason): min/guard for balance, cues for sequence Stair Management Technique: One rail Left;With crutches Number of Stairs: 4     Exercise  Total Joint Exercises Ankle Circles/Pumps: AROM;Both;10 reps Quad Sets: AROM;Right;5 reps Heel Slides: AROM;AAROM;Right;10 reps End of Session PT - End of Session Equipment Utilized During Treatment: Right knee immobilizer Activity Tolerance: Patient tolerated treatment well General Behavior During Session: [facility] for tasks performed Cognition: [facility] for tasks performed  [facility] 02/06/2011, 10:16 AM

## 2011-02-06 NOTE — Progress Notes (Addendum)
Cm spoke with pt concerning d/c planning. Per pt request HHPT. Genevieve Norlander to provide [facility] services. Pt do to d/c home with RW provided by patient. Pt request elongated 3n1. [employer] per pt choice to provide DME. Equipment to be delivered to pt's residence upon discharge.  Leonie Green [phone]

## 2011-02-08 ENCOUNTER — Encounter (HOSPITAL_COMMUNITY): Payer: Self-pay | Admitting: Specialist

## 2012-04-05 ENCOUNTER — Other Ambulatory Visit (HOSPITAL_COMMUNITY): Payer: Self-pay | Admitting: Specialist

## 2012-04-05 DIAGNOSIS — M25561 Pain in right knee: Secondary | ICD-10-CM

## 2012-04-13 ENCOUNTER — Encounter (HOSPITAL_COMMUNITY)
Admission: RE | Admit: 2012-04-13 | Discharge: 2012-04-13 | Disposition: A | Discharge status: Home / Self Care | Payer: BC Managed Care – PPO | Source: Ambulatory Visit | Attending: Specialist | Admitting: Specialist

## 2012-04-13 ENCOUNTER — Encounter (HOSPITAL_COMMUNITY): Payer: Self-pay

## 2012-04-13 DIAGNOSIS — M25561 Pain in right knee: Secondary | ICD-10-CM

## 2012-04-13 DIAGNOSIS — M25569 Pain in unspecified knee: Secondary | ICD-10-CM | POA: Insufficient documentation

## 2012-04-13 DIAGNOSIS — Z96659 Presence of unspecified artificial knee joint: Secondary | ICD-10-CM | POA: Insufficient documentation

## 2012-04-13 MED ORDER — TECHNETIUM TC 99M MEDRONATE IV KIT
24.2000 | PACK | Freq: Once | INTRAVENOUS | Status: AC | PRN
  Administered 2012-04-13: 24.2 via INTRAVENOUS

## 2012-05-22 ENCOUNTER — Ambulatory Visit: Payer: Self-pay

## 2012-06-13 ENCOUNTER — Encounter (HOSPITAL_COMMUNITY): Payer: Self-pay | Admitting: Pharmacy Technician

## 2012-06-21 ENCOUNTER — Other Ambulatory Visit (HOSPITAL_COMMUNITY): Payer: Self-pay | Admitting: Orthopedic Surgery

## 2012-06-21 NOTE — Patient Instructions (Addendum)
20 Manuel Hood  06/21/2012   Your procedure is scheduled on: 06-29-2012  Report to [facility] at 600 AM.  Call this number if you have problems the morning of surgery [phone]   Remember:   Do not eat food or drink liquids :After Midnight.   Take these medicines the morning of surgery with A SIP OF WATER: oxycodone if needed                                SEE [facility] PREPARING FOR SURGERY SHEET   Do not wear jewelry, make-up or nail polish.  Do not wear lotions, powders, or perfumes. You may wear deodorant.   Men may shave face and neck.  Do not bring valuables to the hospital.  Contacts, dentures or bridgework may not be worn into surgery.  Leave suitcase in the car. After surgery it may be brought to your room.  For patients admitted to the hospital, checkout time is 11:00 AM the day of discharge.    Please read over the following fact sheets that you were given: MRSA Information, blood fact sheet, incentive spirometer fact sheet  Call Birdie Sons RN pre op nurse if needed [phone]    FAILURE TO FOLLOW THESE INSTRUCTIONS MAY RESULT IN THE CANCELLATION OF YOUR SURGERY. PATIENT SIGNATURE___________________________________________

## 2012-06-22 ENCOUNTER — Encounter (HOSPITAL_COMMUNITY)
Admission: RE | Admit: 2012-06-22 | Discharge: 2012-06-22 | Disposition: A | Discharge status: Home / Self Care | Payer: BC Managed Care – PPO | Source: Ambulatory Visit | Attending: Orthopedic Surgery | Admitting: Orthopedic Surgery

## 2012-06-22 ENCOUNTER — Encounter (HOSPITAL_COMMUNITY): Payer: Self-pay

## 2012-06-22 ENCOUNTER — Ambulatory Visit (HOSPITAL_COMMUNITY)
Admission: RE | Admit: 2012-06-22 | Discharge: 2012-06-22 | Disposition: A | Discharge status: Home / Self Care | Payer: BC Managed Care – PPO | Source: Ambulatory Visit | Attending: Orthopedic Surgery | Admitting: Orthopedic Surgery

## 2012-06-22 DIAGNOSIS — Z01818 Encounter for other preprocedural examination: Secondary | ICD-10-CM | POA: Insufficient documentation

## 2012-06-22 DIAGNOSIS — I10 Essential (primary) hypertension: Secondary | ICD-10-CM | POA: Insufficient documentation

## 2012-06-22 HISTORY — DX: Unspecified osteoarthritis, unspecified site: M19.90

## 2012-06-22 HISTORY — DX: Other seasonal allergic rhinitis: J30.2

## 2012-06-22 HISTORY — DX: Gastro-esophageal reflux disease without esophagitis: K21.9

## 2012-06-22 LAB — BASIC METABOLIC PANEL
CO2: 27 mEq/L (ref 19–32)
Chloride: 102 mEq/L (ref 96–112)
Creatinine, Ser: 0.83 mg/dL (ref 0.50–1.35)
Glucose, Bld: 131 mg/dL — ABNORMAL HIGH (ref 70–99)
Sodium: 139 mEq/L (ref 135–145)

## 2012-06-22 LAB — URINALYSIS, ROUTINE W REFLEX MICROSCOPIC
Glucose, UA: NEGATIVE mg/dL
Leukocytes, UA: NEGATIVE
pH: 6.5 (ref 5.0–8.0)

## 2012-06-22 LAB — CBC
HCT: 44.1 % (ref 39.0–52.0)
MCV: 94 fL (ref 78.0–100.0)
RBC: 4.69 MIL/uL (ref 4.22–5.81)
WBC: 7.4 10*3/uL (ref 4.0–10.5)

## 2012-06-22 LAB — APTT: aPTT: 30 seconds (ref 24–37)

## 2012-06-22 NOTE — Progress Notes (Signed)
06/22/12 0911  OBSTRUCTIVE SLEEP APNEA  Have you ever been diagnosed with sleep apnea through a sleep study? No  Do you snore loudly (loud enough to be heard through closed doors)?  1  Do you often feel tired, fatigued, or sleepy during the daytime? 0  Has anyone observed you stop breathing during your sleep? 0  Do you have, or are you being treated for high blood pressure? 1  BMI more than 35 kg/m2? 0  Age over 63 years old? 1  Neck circumference greater than 40 cm/18 inches? 0  Gender: 1  Obstructive Sleep Apnea Score 4  Score 4 or greater  Results sent to PCP

## 2012-06-22 NOTE — Progress Notes (Signed)
EKG 05/11/12 on chart, surgery clearance note 05/11/12 Dr. Clarene Duke on chart, LOV note Dr. Clarene Duke 05/11/12 on chart.

## 2012-06-25 NOTE — H&P (Signed)
TOTAL KNEE REVISION ADMISSION H&P  Patient is being admitted for revision of the right total knee arthroplasty (plan for femoral component, poly exchange and debridement) & left knee arthroscopy with debridement and menisectomy.  Subjective:  Chief Complaint: Failed right total knee prosthesis and left knee meniscal tear / pain.  HPI: Manuel Hood, 62 y.o. male, has a history of pain in the right knee which was previously replaced by Dr. Shelle Iron in January 2013.  He states that the knee has been hurting since that time.  His symptoms appear to mainly be popping and pain over the lateral femoral condyle area.  Patient has failed non-surgical conservative treatments for greater than 12 weeks to include NSAID's and/or analgesics, corticosteriod injections, supervised PT with diminished ADL's post treatment and activity modification. The indications for the revision of the total knee arthroplasty are pain over the lateral aspect of the femoral condyle and possible area of impingement on the lateral patella region. Onset of symptoms was abrupt starting 1.5 years ago with gradually worsening course since that time.  Prior procedures on the right knee(s) include arthroplasty.  Patient currently rates pain in the right knee(s) at 10 out of 10 with activity. There is night pain, worsening of pain with activity and weight bearing, pain that interferes with activities of daily living, pain with passive range of motion, crepitus and joint swelling.  Patient has evidence of possible impingement of an area on the lateral patella area.  This condition presents safety issues increasing the risk of falls.  There are no current signs of active infection.  He has also been compensating with the left knee because of the right knee pain.  Do to pain in the left knee an MRI was obtained which revealed a meniscus tear. Dr. Charlann Boxer has discussed fixing this as well and he wishes to proceed with both surgeries.  Risks, benefits and  expectations were discussed with the patient. Patient understand the risks, benefits and expectations and wishes to proceed with surgery.   D/C Plans:   Home with HHPT  Post-op Meds:   No Rx given  Tranexamic Acid:   To be given  Decadron:    To be given  FYI:    ASA post-op  Oxycodone for pain    Past Medical History  Diagnosis Date  . Hypertension   . Sinus pain   . Seasonal allergies   . GERD (gastroesophageal reflux disease)   . Arthritis     Past Surgical History  Procedure Laterality Date  . Knee arthroscopy  rt knee x3  . Rotator cuff repair  rt shoulder  . Back surgery      x2 (cervical and lumbar)  . Appendectomy    . Total knee arthroplasty  02/04/2011    Procedure: TOTAL KNEE ARTHROPLASTY;  Surgeon: Javier Docker;  Location: [facility];  Service: Orthopedics;  Laterality: Right;    No prescriptions prior to admission   No Known Allergies   History  Substance Use Topics  . Smoking status: Former Smoker -- 20 years    Types: Cigarettes    Quit date: 02/02/1996  . Smokeless tobacco: Never Used  . Alcohol Use: 0.0 oz/week     Comment: 1 beer a day      Review of Systems  Constitutional: Negative.   HENT: Negative.   Eyes: Negative.   Respiratory: Negative.   Cardiovascular: Negative.   Gastrointestinal: Positive for heartburn.  Genitourinary: Negative.   Musculoskeletal: Positive for joint pain.  Skin: Negative.   Endo/Heme/Allergies: Positive for environmental allergies.  Psychiatric/Behavioral: Negative.      Objective:  Physical Exam  Constitutional: He is oriented to person, place, and time. He appears well-developed and well-nourished.  HENT:  Head: Normocephalic and atraumatic.  Mouth/Throat: Oropharynx is clear and moist.  Eyes: Pupils are equal, round, and reactive to light.  Neck: Neck supple. No JVD present. No tracheal deviation present. No thyromegaly present.  Cardiovascular: Normal rate, regular rhythm, normal heart sounds and  intact distal pulses.   Respiratory: Effort normal and breath sounds normal. No stridor. No respiratory distress. He has no wheezes.  GI: Soft. There is no tenderness. There is no guarding.  Musculoskeletal:       Right knee: He exhibits decreased range of motion, swelling, laceration (healed from previous surgery) and bony tenderness. He exhibits no effusion, no ecchymosis, no erythema and normal alignment. Tenderness found. Lateral joint line tenderness noted.       Left knee: He exhibits decreased range of motion, swelling and bony tenderness. He exhibits no effusion, no ecchymosis, no deformity, no laceration and no erythema. Tenderness found.  Lymphadenopathy:    He has no cervical adenopathy.  Neurological: He is alert and oriented to person, place, and time.  Skin: Skin is warm and dry.  Psychiatric: He has a normal mood and affect.   Labs:  Estimated body mass index is 31.29 kg/(m^2) as calculated from the following:   Height as of 02/04/11: 5\' 9"  (1.753 m).   Weight as of 01/29/11: 96.163 kg (212 lb).  Imaging Review Plain radiographs demonstrate previous total knee arthroplasty of the right knee. The overall alignment is neutral. The bone quality appears to be good for age and reported activity level. There is also evidence of meniscal tear in the left knee on MRI.  Assessment/Plan:  Right knee with failed previous arthroplasty and left knee meniscal tear.   The patient history, physical examination, clinical judgment of the provider and imaging studies are consistent with previous right total knee arthroplasty. Revision total knee arthroplasty is deemed medically necessary.  As well as a left knee arthroscopy for a menisectomy to repair meniscal tear evident on MRI. The treatment options including medical management, injection therapy, arthroscopy and revision arthroplasty were discussed at length. The risks and benefits of revision total knee arthroplasty were presented and  reviewed. The risks due to aseptic loosening, infection, stiffness, patella tracking problems, thromboembolic complications and other imponderables were discussed. The patient acknowledged the explanation, agreed to proceed with the plan and consent was signed. Patient is being admitted for inpatient treatment for surgery, pain control, PT, OT, prophylactic antibiotics, VTE prophylaxis, progressive ambulation and ADL's and discharge planning.The patient is planning to be discharged home with home health services.    Anastasio Auerbach Kamy Poinsett   PAC  06/25/2012, 10:44 AM

## 2012-06-28 NOTE — Anesthesia Preprocedure Evaluation (Addendum)
Anesthesia Evaluation  Patient identified by MRN, date of birth, ID band Patient awake    Reviewed: Allergy & Precautions, H&P , NPO status , Patient's Chart, lab work & pertinent test results  Airway Mallampati: II TM Distance: >3 FB Neck ROM: full    Dental no notable dental hx. (+) Teeth Intact and Dental Advisory Given   Pulmonary neg pulmonary ROS,  breath sounds clear to auscultation  Pulmonary exam normal       Cardiovascular Exercise Tolerance: Good hypertension, Pt. on medications negative cardio ROS  Rhythm:regular Rate:Normal     Neuro/Psych History back surgery negative neurological ROS  negative psych ROS   GI/Hepatic negative GI ROS, Neg liver ROS, GERD-  Controlled and Medicated,  Endo/Other  negative endocrine ROS  Renal/GU negative Renal ROS  negative genitourinary   Musculoskeletal   Abdominal   Peds  Hematology negative hematology ROS (+)   Anesthesia Other Findings   Reproductive/Obstetrics negative OB ROS                          Anesthesia Physical Anesthesia Plan  ASA: II  Anesthesia Plan: General   Post-op Pain Management:    Induction:   Airway Management Planned: Oral ETT  Additional Equipment:   Intra-op Plan:   Post-operative Plan: Extubation in OR  Informed Consent: I have reviewed the patients History and Physical, chart, labs and discussed the procedure including the risks, benefits and alternatives for the proposed anesthesia with the patient or authorized representative who has indicated his/her understanding and acceptance.   Dental Advisory Given  Plan Discussed with: CRNA and Surgeon  Anesthesia Plan Comments:        Anesthesia Quick Evaluation

## 2012-06-28 NOTE — Progress Notes (Signed)
Pt called about change in surgery time from 8am to 730am. Pt agreed to be at short stay by 530. Pt has no questions or concerns.

## 2012-06-29 ENCOUNTER — Encounter (HOSPITAL_COMMUNITY): Payer: Self-pay | Admitting: Anesthesiology

## 2012-06-29 ENCOUNTER — Inpatient Hospital Stay (HOSPITAL_COMMUNITY): Payer: BC Managed Care – PPO | Admitting: Anesthesiology

## 2012-06-29 ENCOUNTER — Encounter (HOSPITAL_COMMUNITY): Payer: Self-pay | Admitting: *Deleted

## 2012-06-29 ENCOUNTER — Encounter (HOSPITAL_COMMUNITY)
Admission: RE | Disposition: A | Discharge status: Home Health | Payer: Self-pay | Source: Ambulatory Visit | Attending: Orthopedic Surgery

## 2012-06-29 ENCOUNTER — Inpatient Hospital Stay (HOSPITAL_COMMUNITY)
Admission: RE | Admit: 2012-06-29 | Discharge: 2012-06-30 | DRG: 471 | Disposition: A | Discharge status: Home Health | Payer: BC Managed Care – PPO | Source: Ambulatory Visit | Attending: Orthopedic Surgery | Admitting: Orthopedic Surgery

## 2012-06-29 DIAGNOSIS — D500 Iron deficiency anemia secondary to blood loss (chronic): Secondary | ICD-10-CM | POA: Diagnosis not present

## 2012-06-29 DIAGNOSIS — M23305 Other meniscus derangements, unspecified medial meniscus, unspecified knee: Secondary | ICD-10-CM | POA: Diagnosis present

## 2012-06-29 DIAGNOSIS — T84099A Other mechanical complication of unspecified internal joint prosthesis, initial encounter: Principal | ICD-10-CM | POA: Diagnosis present

## 2012-06-29 DIAGNOSIS — I10 Essential (primary) hypertension: Secondary | ICD-10-CM | POA: Diagnosis present

## 2012-06-29 DIAGNOSIS — E669 Obesity, unspecified: Secondary | ICD-10-CM | POA: Diagnosis present

## 2012-06-29 DIAGNOSIS — Z9889 Other specified postprocedural states: Secondary | ICD-10-CM

## 2012-06-29 DIAGNOSIS — D62 Acute posthemorrhagic anemia: Secondary | ICD-10-CM | POA: Diagnosis not present

## 2012-06-29 DIAGNOSIS — Y831 Surgical operation with implant of artificial internal device as the cause of abnormal reaction of the patient, or of later complication, without mention of misadventure at the time of the procedure: Secondary | ICD-10-CM | POA: Diagnosis present

## 2012-06-29 DIAGNOSIS — Z96651 Presence of right artificial knee joint: Secondary | ICD-10-CM

## 2012-06-29 DIAGNOSIS — Z87891 Personal history of nicotine dependence: Secondary | ICD-10-CM

## 2012-06-29 DIAGNOSIS — M2240 Chondromalacia patellae, unspecified knee: Secondary | ICD-10-CM | POA: Diagnosis present

## 2012-06-29 DIAGNOSIS — K219 Gastro-esophageal reflux disease without esophagitis: Secondary | ICD-10-CM | POA: Diagnosis present

## 2012-06-29 DIAGNOSIS — Z96659 Presence of unspecified artificial knee joint: Secondary | ICD-10-CM

## 2012-06-29 HISTORY — PX: KNEE ARTHROSCOPY WITH MEDIAL MENISECTOMY: SHX5651

## 2012-06-29 HISTORY — PX: TOTAL KNEE REVISION: SHX996

## 2012-06-29 SURGERY — TOTAL KNEE REVISION
Anesthesia: Spinal | Site: Knee | Laterality: Right | Wound class: Clean

## 2012-06-29 MED ORDER — METOCLOPRAMIDE HCL 10 MG PO TABS: 5.0000 mg | ORAL_TABLET | Freq: Three times a day (TID) | ORAL | Status: DC | PRN

## 2012-06-29 MED ORDER — DEXAMETHASONE SODIUM PHOSPHATE 10 MG/ML IJ SOLN: 10.0000 mg | Freq: Once | INTRAMUSCULAR | Status: DC

## 2012-06-29 MED ORDER — FLEET ENEMA 7-19 GM/118ML RE ENEM: 1.0000 | ENEMA | Freq: Once | RECTAL | Status: AC | PRN

## 2012-06-29 MED ORDER — ONDANSETRON HCL 4 MG PO TABS: 4.0000 mg | ORAL_TABLET | Freq: Four times a day (QID) | ORAL | Status: DC | PRN

## 2012-06-29 MED ORDER — ONDANSETRON HCL 4 MG/2ML IJ SOLN
INTRAMUSCULAR | Status: DC | PRN
  Administered 2012-06-29: 4 mg via INTRAVENOUS

## 2012-06-29 MED ORDER — DEXAMETHASONE SODIUM PHOSPHATE 10 MG/ML IJ SOLN
10.0000 mg | Freq: Once | INTRAMUSCULAR | Status: DC
  Filled 2012-06-29: qty 1

## 2012-06-29 MED ORDER — METHOCARBAMOL 500 MG PO TABS
500.0000 mg | ORAL_TABLET | Freq: Four times a day (QID) | ORAL | Status: DC | PRN
  Administered 2012-06-29 – 2012-06-30 (×3): 500 mg via ORAL
  Filled 2012-06-29 (×3): qty 1

## 2012-06-29 MED ORDER — BUPIVACAINE LIPOSOME 1.3 % IJ SUSP
20.0000 mL | Freq: Once | INTRAMUSCULAR | Status: DC
  Filled 2012-06-29: qty 20

## 2012-06-29 MED ORDER — BUPIVACAINE-EPINEPHRINE 0.25% -1:200000 IJ SOLN
INTRAMUSCULAR | Status: DC | PRN
  Administered 2012-06-29: 30 mL

## 2012-06-29 MED ORDER — DOCUSATE SODIUM 100 MG PO CAPS
100.0000 mg | ORAL_CAPSULE | Freq: Two times a day (BID) | ORAL | Status: DC
  Administered 2012-06-29 – 2012-06-30 (×3): 100 mg via ORAL

## 2012-06-29 MED ORDER — FERROUS SULFATE 325 (65 FE) MG PO TABS
325.0000 mg | ORAL_TABLET | Freq: Three times a day (TID) | ORAL | Status: DC
  Administered 2012-06-29 – 2012-06-30 (×4): 325 mg via ORAL
  Filled 2012-06-29 (×6): qty 1

## 2012-06-29 MED ORDER — ROCURONIUM BROMIDE 100 MG/10ML IV SOLN
INTRAVENOUS | Status: DC | PRN
  Administered 2012-06-29: 50 mg via INTRAVENOUS
  Administered 2012-06-29 (×2): 10 mg via INTRAVENOUS

## 2012-06-29 MED ORDER — CEFAZOLIN SODIUM-DEXTROSE 2-3 GM-% IV SOLR
2.0000 g | Freq: Four times a day (QID) | INTRAVENOUS | Status: AC
  Administered 2012-06-29 (×2): 2 g via INTRAVENOUS
  Filled 2012-06-29 (×2): qty 50

## 2012-06-29 MED ORDER — HYDROMORPHONE HCL PF 1 MG/ML IJ SOLN
0.2500 mg | INTRAMUSCULAR | Status: DC | PRN
  Administered 2012-06-29 (×4): 0.5 mg via INTRAVENOUS

## 2012-06-29 MED ORDER — LIDOCAINE HCL (CARDIAC) 20 MG/ML IV SOLN
INTRAVENOUS | Status: DC | PRN
  Administered 2012-06-29: 100 mg via INTRAVENOUS

## 2012-06-29 MED ORDER — HYDROMORPHONE HCL PF 1 MG/ML IJ SOLN
INTRAMUSCULAR | Status: DC | PRN
  Administered 2012-06-29 (×2): 1 mg via INTRAVENOUS

## 2012-06-29 MED ORDER — METOCLOPRAMIDE HCL 5 MG/ML IJ SOLN: 5.0000 mg | Freq: Three times a day (TID) | INTRAMUSCULAR | Status: DC | PRN

## 2012-06-29 MED ORDER — MENTHOL 3 MG MT LOZG
1.0000 | LOZENGE | OROMUCOSAL | Status: DC | PRN
  Filled 2012-06-29: qty 9

## 2012-06-29 MED ORDER — SODIUM CHLORIDE 0.9 % IR SOLN
Status: DC | PRN
  Administered 2012-06-29: 1000 mL

## 2012-06-29 MED ORDER — ASPIRIN EC 325 MG PO TBEC
325.0000 mg | DELAYED_RELEASE_TABLET | Freq: Two times a day (BID) | ORAL | Status: DC
  Administered 2012-06-30: 325 mg via ORAL
  Filled 2012-06-29 (×3): qty 1

## 2012-06-29 MED ORDER — ZOLPIDEM TARTRATE 5 MG PO TABS: 5.0000 mg | ORAL_TABLET | Freq: Every evening | ORAL | Status: DC | PRN

## 2012-06-29 MED ORDER — SODIUM CHLORIDE 0.9 % IV SOLN
INTRAVENOUS | Status: DC
  Administered 2012-06-29 – 2012-06-30 (×2): via INTRAVENOUS
  Filled 2012-06-29 (×9): qty 1000

## 2012-06-29 MED ORDER — MIDAZOLAM HCL 5 MG/5ML IJ SOLN
INTRAMUSCULAR | Status: DC | PRN
  Administered 2012-06-29: 2 mg via INTRAVENOUS

## 2012-06-29 MED ORDER — BISACODYL 10 MG RE SUPP: 10.0000 mg | Freq: Every day | RECTAL | Status: DC | PRN

## 2012-06-29 MED ORDER — LACTATED RINGERS IV SOLN
INTRAVENOUS | Status: DC
  Administered 2012-06-29: 1000 mL via INTRAVENOUS

## 2012-06-29 MED ORDER — CEFAZOLIN SODIUM-DEXTROSE 2-3 GM-% IV SOLR
2.0000 g | INTRAVENOUS | Status: AC
  Administered 2012-06-29: 2 g via INTRAVENOUS

## 2012-06-29 MED ORDER — LACTATED RINGERS IV SOLN
INTRAVENOUS | Status: DC | PRN
  Administered 2012-06-29 (×3): via INTRAVENOUS

## 2012-06-29 MED ORDER — POLYETHYLENE GLYCOL 3350 17 G PO PACK
17.0000 g | PACK | Freq: Two times a day (BID) | ORAL | Status: DC
  Administered 2012-06-29: 17 g via ORAL

## 2012-06-29 MED ORDER — SUCCINYLCHOLINE CHLORIDE 20 MG/ML IJ SOLN
INTRAMUSCULAR | Status: DC | PRN
  Administered 2012-06-29: 100 mg via INTRAVENOUS

## 2012-06-29 MED ORDER — OXYCODONE HCL 5 MG PO TABS
5.0000 mg | ORAL_TABLET | ORAL | Status: DC
  Administered 2012-06-29: 5 mg via ORAL
  Administered 2012-06-29: 10 mg via ORAL
  Administered 2012-06-29: 5 mg via ORAL
  Administered 2012-06-30 (×3): 10 mg via ORAL
  Administered 2012-06-30: 5 mg via ORAL
  Filled 2012-06-29: qty 2
  Filled 2012-06-29: qty 1
  Filled 2012-06-29: qty 2
  Filled 2012-06-29: qty 1
  Filled 2012-06-29 (×3): qty 2

## 2012-06-29 MED ORDER — PROPOFOL 10 MG/ML IV BOLUS
INTRAVENOUS | Status: DC | PRN
  Administered 2012-06-29: 180 mg via INTRAVENOUS

## 2012-06-29 MED ORDER — ROPIVACAINE HCL 5 MG/ML IJ SOLN
INTRAMUSCULAR | Status: DC | PRN
  Administered 2012-06-29: 30 mL

## 2012-06-29 MED ORDER — DM-GUAIFENESIN ER 30-600 MG PO TB12
1.0000 | ORAL_TABLET | Freq: Two times a day (BID) | ORAL | Status: DC
Start: 2012-06-29 — End: 2012-06-30
  Administered 2012-06-30: 1 via ORAL
  Filled 2012-06-29 (×5): qty 1

## 2012-06-29 MED ORDER — LACTATED RINGERS IR SOLN
Status: DC | PRN
  Administered 2012-06-29 (×3): 3000 mL

## 2012-06-29 MED ORDER — PHENOL 1.4 % MT LIQD
1.0000 | OROMUCOSAL | Status: DC | PRN
  Filled 2012-06-29: qty 177

## 2012-06-29 MED ORDER — ALUM & MAG HYDROXIDE-SIMETH 200-200-20 MG/5ML PO SUSP: 30.0000 mL | ORAL | Status: DC | PRN

## 2012-06-29 MED ORDER — HYDROMORPHONE HCL PF 1 MG/ML IJ SOLN
0.5000 mg | INTRAMUSCULAR | Status: DC | PRN
  Administered 2012-06-29: 1 mg via INTRAVENOUS
  Filled 2012-06-29: qty 1

## 2012-06-29 MED ORDER — ONDANSETRON HCL 4 MG/2ML IJ SOLN: 4.0000 mg | Freq: Four times a day (QID) | INTRAMUSCULAR | Status: DC | PRN

## 2012-06-29 MED ORDER — TRANEXAMIC ACID 100 MG/ML IV SOLN
1000.0000 mg | Freq: Once | INTRAVENOUS | Status: AC
  Administered 2012-06-29: 1000 mg via INTRAVENOUS
  Filled 2012-06-29: qty 10

## 2012-06-29 MED ORDER — LOSARTAN POTASSIUM 50 MG PO TABS
100.0000 mg | ORAL_TABLET | Freq: Every day | ORAL | Status: DC
  Administered 2012-06-30: 100 mg via ORAL
  Filled 2012-06-29 (×2): qty 2

## 2012-06-29 MED ORDER — DEXAMETHASONE SODIUM PHOSPHATE 10 MG/ML IJ SOLN
INTRAMUSCULAR | Status: DC | PRN
  Administered 2012-06-29: 10 mg via INTRAVENOUS

## 2012-06-29 MED ORDER — METHOCARBAMOL 100 MG/ML IJ SOLN
500.0000 mg | Freq: Four times a day (QID) | INTRAVENOUS | Status: DC | PRN
  Administered 2012-06-29: 500 mg via INTRAVENOUS
  Filled 2012-06-29 (×2): qty 5

## 2012-06-29 MED ORDER — CELECOXIB 200 MG PO CAPS
200.0000 mg | ORAL_CAPSULE | Freq: Two times a day (BID) | ORAL | Status: DC
  Administered 2012-06-29 – 2012-06-30 (×3): 200 mg via ORAL
  Filled 2012-06-29 (×4): qty 1

## 2012-06-29 MED ORDER — NEOSTIGMINE METHYLSULFATE 1 MG/ML IJ SOLN
INTRAMUSCULAR | Status: DC | PRN
  Administered 2012-06-29: 5 mg via INTRAVENOUS

## 2012-06-29 MED ORDER — GLYCOPYRROLATE 0.2 MG/ML IJ SOLN
INTRAMUSCULAR | Status: DC | PRN
  Administered 2012-06-29: 0.6 mg via INTRAVENOUS

## 2012-06-29 MED ORDER — DIPHENHYDRAMINE HCL 25 MG PO CAPS: 25.0000 mg | ORAL_CAPSULE | Freq: Four times a day (QID) | ORAL | Status: DC | PRN

## 2012-06-29 MED ORDER — FENTANYL CITRATE 0.05 MG/ML IJ SOLN
INTRAMUSCULAR | Status: DC | PRN
  Administered 2012-06-29: 50 ug via INTRAVENOUS
  Administered 2012-06-29: 100 ug via INTRAVENOUS
  Administered 2012-06-29 (×2): 50 ug via INTRAVENOUS
  Administered 2012-06-29: 100 ug via INTRAVENOUS

## 2012-06-29 SURGICAL SUPPLY — 72 items
BAG ZIPLOCK 12X15 (MISCELLANEOUS) ×3 IMPLANT
BANDAGE ELASTIC 6 VELCRO ST LF (GAUZE/BANDAGES/DRESSINGS) ×6 IMPLANT
BANDAGE ESMARK 6X9 LF (GAUZE/BANDAGES/DRESSINGS) ×2 IMPLANT
BLADE CUDA SHAVER 3.5 (BLADE) ×3 IMPLANT
BLADE SAW SGTL 13.0X1.19X90.0M (BLADE) ×3 IMPLANT
BLADE SAW SGTL 81X20 HD (BLADE) ×3 IMPLANT
BNDG ESMARK 6X9 LF (GAUZE/BANDAGES/DRESSINGS) ×3
BOWL SMART MIX CTS (DISPOSABLE) IMPLANT
CEMENT HV SMART SET (Cement) ×6 IMPLANT
CLOTH BEACON ORANGE TIMEOUT ST (SAFETY) ×3 IMPLANT
COMP FEM CEM RT SZ4 (Orthopedic Implant) ×3 IMPLANT
COMPONENT FEM CEM RT SZ4 (Orthopedic Implant) ×2 IMPLANT
CUFF TOURN SGL QUICK 34 (TOURNIQUET CUFF) ×1
CUFF TRNQT CYL 34X4X40X1 (TOURNIQUET CUFF) ×2 IMPLANT
DERMABOND ADVANCED (GAUZE/BANDAGES/DRESSINGS) ×1
DERMABOND ADVANCED .7 DNX12 (GAUZE/BANDAGES/DRESSINGS) ×2 IMPLANT
DRAPE EXTREMITY T 121X128X90 (DRAPE) ×3 IMPLANT
DRAPE POUCH INSTRU U-SHP 10X18 (DRAPES) ×3 IMPLANT
DRAPE U-SHAPE 47X51 STRL (DRAPES) ×3 IMPLANT
DRSG ADAPTIC 3X8 NADH LF (GAUZE/BANDAGES/DRESSINGS) ×3 IMPLANT
DRSG AQUACEL AG ADV 3.5X10 (GAUZE/BANDAGES/DRESSINGS) ×3 IMPLANT
DRSG EMULSION OIL 3X3 NADH (GAUZE/BANDAGES/DRESSINGS) ×3 IMPLANT
DRSG PAD ABDOMINAL 8X10 ST (GAUZE/BANDAGES/DRESSINGS) ×3 IMPLANT
DRSG TEGADERM 4X4.75 (GAUZE/BANDAGES/DRESSINGS) ×3 IMPLANT
DURAPREP 26ML APPLICATOR (WOUND CARE) ×3 IMPLANT
ELECT REM PT RETURN 9FT ADLT (ELECTROSURGICAL) ×3
ELECTRODE REM PT RTRN 9FT ADLT (ELECTROSURGICAL) ×2 IMPLANT
EVACUATOR 1/8 PVC DRAIN (DRAIN) ×3 IMPLANT
FACESHIELD LNG OPTICON STERILE (SAFETY) ×15 IMPLANT
GAUZE SPONGE 2X2 8PLY STRL LF (GAUZE/BANDAGES/DRESSINGS) ×2 IMPLANT
GLOVE BIOGEL PI IND STRL 7.5 (GLOVE) ×2 IMPLANT
GLOVE BIOGEL PI IND STRL 8 (GLOVE) ×4 IMPLANT
GLOVE BIOGEL PI INDICATOR 7.5 (GLOVE) ×1
GLOVE BIOGEL PI INDICATOR 8 (GLOVE) ×2
GLOVE ECLIPSE 8.0 STRL XLNG CF (GLOVE) ×6 IMPLANT
GLOVE ORTHO TXT STRL SZ7.5 (GLOVE) ×6 IMPLANT
GLOVE SURG ORTHO 8.0 STRL STRW (GLOVE) ×3 IMPLANT
GOWN BRE IMP PREV XXLGXLNG (GOWN DISPOSABLE) ×6 IMPLANT
GOWN STRL NON-REIN LRG LVL3 (GOWN DISPOSABLE) ×3 IMPLANT
HANDPIECE INTERPULSE COAX TIP (DISPOSABLE) ×1
IMMOBILIZER KNEE 20 (SOFTGOODS)
IMMOBILIZER KNEE 20 THIGH 36 (SOFTGOODS) IMPLANT
KIT BASIN OR (CUSTOM PROCEDURE TRAY) ×3 IMPLANT
MANIFOLD NEPTUNE II (INSTRUMENTS) ×6 IMPLANT
NDL SAFETY ECLIPSE 18X1.5 (NEEDLE) ×2 IMPLANT
NEEDLE HYPO 18GX1.5 SHARP (NEEDLE) ×1
NS IRRIG 1000ML POUR BTL (IV SOLUTION) ×3 IMPLANT
PACK ARTHROSCOPY WL (CUSTOM PROCEDURE TRAY) ×3 IMPLANT
PACK TOTAL JOINT (CUSTOM PROCEDURE TRAY) ×3 IMPLANT
PADDING CAST COTTON 6X4 STRL (CAST SUPPLIES) ×3 IMPLANT
PATELLA DOME PFC 41MM (Knees) ×3 IMPLANT
PLATE ROT INSERT 15MM SIZE 4 (Plate) ×3 IMPLANT
POSITIONER SURGICAL ARM (MISCELLANEOUS) ×3 IMPLANT
SET ARTHROSCOPY TUBING (MISCELLANEOUS) ×1
SET ARTHROSCOPY TUBING LN (MISCELLANEOUS) ×2 IMPLANT
SET HNDPC FAN SPRY TIP SCT (DISPOSABLE) ×2 IMPLANT
SET PAD KNEE POSITIONER (MISCELLANEOUS) ×3 IMPLANT
SPONGE GAUZE 2X2 STER 10/PKG (GAUZE/BANDAGES/DRESSINGS) ×1
SPONGE GAUZE 4X4 12PLY (GAUZE/BANDAGES/DRESSINGS) ×3 IMPLANT
SPONGE LAP 18X18 X RAY DECT (DISPOSABLE) ×3 IMPLANT
STAPLER VISISTAT 35W (STAPLE) IMPLANT
SUCTION FRAZIER 12FR DISP (SUCTIONS) ×3 IMPLANT
SUT ETHILON 4 0 PS 2 18 (SUTURE) ×3 IMPLANT
SUT VIC AB 1 CT1 36 (SUTURE) ×9 IMPLANT
SUT VIC AB 2-0 CT1 27 (SUTURE) ×3
SUT VIC AB 2-0 CT1 TAPERPNT 27 (SUTURE) ×6 IMPLANT
SYR 50ML LL SCALE MARK (SYRINGE) ×3 IMPLANT
TOWEL OR 17X26 10 PK STRL BLUE (TOWEL DISPOSABLE) ×6 IMPLANT
TOWER CARTRIDGE SMART MIX (DISPOSABLE) ×3 IMPLANT
TRAY FOLEY CATH 14FRSI W/METER (CATHETERS) ×3 IMPLANT
WATER STERILE IRR 1500ML POUR (IV SOLUTION) ×3 IMPLANT
WRAP KNEE MAXI GEL POST OP (GAUZE/BANDAGES/DRESSINGS) ×3 IMPLANT

## 2012-06-29 NOTE — Anesthesia Procedure Notes (Signed)
Anesthesia Regional Block:  Femoral nerve block  Pre-Anesthetic Checklist: ,, timeout performed, Correct Patient, Correct Site, Correct Laterality, Correct Procedure, Correct Position, site marked, Risks and benefits discussed,  Surgical consent,  Pre-op evaluation,  At surgeon's request and post-op pain management  Laterality: Right  Prep: chloraprep       Needles:  Injection technique: Single-shot  Needle Type: Echogenic Stimulator Needle     Needle Length:cm 9 cm Needle Gauge: 20 and 20 G    Additional Needles:  Procedures: ultrasound guided (picture in chart) and nerve stimulator Femoral nerve block  Nerve Stimulator or Paresthesia:  Response: 0.5 mA,   Additional Responses:   Narrative:  Start time: 06/29/2012 7:05 AM End time: 06/29/2012 7:15 AM Injection made incrementally with aspirations every 5 mL.  Performed by: Personally  Anesthesiologist: Gaetano Hawthorne MD  Additional Notes: Patient tolerated the procedure well without complications  Femoral nerve block

## 2012-06-29 NOTE — Evaluation (Signed)
Physical Therapy Evaluation Patient Details Name: Manuel Hood MRN: [id number] DOB: [birth year] Today's Date: 06/29/2012 Time: 4098-1191 PT Time Calculation (min): 38 min  PT Assessment / Plan / Recommendation Clinical Impression  Pt s/p R TKR revision and L knee arthroscopy presents with decreased strength/ROM Bil LEs (R more greatly affected) and post op pain limiting functional mobility    PT Assessment  Patient needs continued PT services    Follow Up Recommendations  No PT follow up (Pt states he does not need HHPT - wife will assist)    Does the patient have the potential to tolerate intense rehabilitation      Barriers to Discharge None      Equipment Recommendations  None recommended by PT    Recommendations for Other Services OT consult   Frequency 7X/week    Precautions / Restrictions Precautions Precautions: Knee;Fall Required Braces or Orthoses: Knee Immobilizer - Right Knee Immobilizer - Right: On when out of bed or walking Restrictions Weight Bearing Restrictions: No Other Position/Activity Restrictions: WBAT   Pertinent Vitals/Pain 5/10; premed, ice packs provided      Mobility  Bed Mobility Bed Mobility: Supine to Sit Supine to Sit: 4: Min assist Details for Bed Mobility Assistance: cues for sequence and use of L LE to self assist Transfers Transfers: Sit to Stand;Stand to Sit Sit to Stand: 4: Min assist;3: Mod assist Stand to Sit: 4: Min assist;3: Mod assist Details for Transfer Assistance: cues for LE management and use of UEs to self assist Ambulation/Gait Ambulation/Gait Assistance: 4: Min assist Ambulation Distance (Feet): 48 Feet Assistive device: Rolling walker Ambulation/Gait Assistance Details: cues for posture, sequence and position from RW Gait Pattern: Step-to pattern;Decreased stride length;Decreased stance time - right Stairs: No    Exercises Total Joint Exercises Ankle Circles/Pumps: AROM;Both;15 reps;Supine Quad Sets:  AROM;Both;10 reps;Supine Heel Slides: AAROM;Both;10 reps;Supine Straight Leg Raises: AROM;AAROM;Both;10 reps;Supine   PT Diagnosis: Difficulty walking  PT Problem List: Decreased strength;Decreased range of motion;Decreased activity tolerance;Decreased mobility;Decreased knowledge of use of DME;Pain;Decreased knowledge of precautions PT Treatment Interventions: DME instruction;Gait training;Stair training;Functional mobility training;Therapeutic activities;Therapeutic exercise;Patient/family education   PT Goals Acute Rehab PT Goals PT Goal Formulation: With patient Time For Goal Achievement: 07/05/12 Potential to Achieve Goals: Good Pt will go Supine/Side to Sit: with supervision PT Goal: Supine/Side to Sit - Progress: Goal set today Pt will go Sit to Supine/Side: with supervision PT Goal: Sit to Supine/Side - Progress: Goal set today Pt will go Sit to Stand: with supervision PT Goal: Sit to Stand - Progress: Goal set today Pt will go Stand to Sit: with supervision PT Goal: Stand to Sit - Progress: Goal set today Pt will Ambulate: 51 - 150 feet;with supervision;with rolling walker PT Goal: Ambulate - Progress: Goal set today Pt will Go Up / Down Stairs: 6-9 stairs;with min assist;with least restrictive assistive device PT Goal: Up/Down Stairs - Progress: Goal set today  Visit Information  Last PT Received On: 06/29/12 Assistance Needed: +1    Subjective Data  Subjective: This knee has hurt for 17 months, I hope they fixed it now Patient Stated Goal: Resume previous active lifestyle   Prior Functioning  Home Living Lives With: Spouse Available Help at Discharge: Family Type of Home: House Home Access: Stairs to enter Secretary/administrator of Steps: 6 Entrance Stairs-Rails: Right Home Layout: One level Home Adaptive Equipment: Walker - rolling;Straight cane;Crutches Prior Function Level of Independence: Independent Able to Take Stairs?: Yes Driving: Yes Vocation: Part  time employment Communication Communication:  No difficulties Dominant Hand: Right    Cognition  Cognition Arousal/Alertness: Awake/alert Behavior During Therapy: WFL for tasks assessed/performed Overall Cognitive Status: Within Functional Limits for tasks assessed    Extremity/Trunk Assessment Right Upper Extremity Assessment RUE ROM/Strength/Tone: WFL for tasks assessed Left Upper Extremity Assessment LUE ROM/Strength/Tone: WFL for tasks assessed Right Lower Extremity Assessment RLE ROM/Strength/Tone: Deficits RLE ROM/Strength/Tone Deficits: Quads 2-/5 with AAROM at knee -10 - 65 Left Lower Extremity Assessment LLE ROM/Strength/Tone: Deficits LLE ROM/Strength/Tone Deficits: 3/5 quads with AAROM at knee -8 - 80 Trunk Assessment Trunk Assessment: Normal   Balance    End of Session PT - End of Session Equipment Utilized During Treatment: Gait belt;Right knee immobilizer Activity Tolerance: Patient tolerated treatment well Patient left: in chair;with call bell/phone within reach Nurse Communication: Mobility status  GP     Yaneth Fairbairn 06/29/2012, 5:17 PM

## 2012-06-29 NOTE — Progress Notes (Signed)
Utilization review completed.  

## 2012-06-29 NOTE — Anesthesia Postprocedure Evaluation (Signed)
  Anesthesia Post-op Note  Patient: Manuel Hood  Procedure(s) Performed: Procedure(s) (LRB): REVISION RIGHT TOTAL KNEE/REVISION FEMORAL COMPONENT POLY EXCHANGE/PATELLA LATERAL FACETECTOMY (Right) LEFT KNEE ARTHROSCOPY WITH DEBRIDEMENT AND MENISECTOMY (Left)  Patient Location: PACU  Anesthesia Type: GA combined with regional for post-op pain  Level of Consciousness: awake and alert   Airway and Oxygen Therapy: Patient Spontanous Breathing  Post-op Pain: mild  Post-op Assessment: Post-op Vital signs reviewed, Patient's Cardiovascular Status Stable, Respiratory Function Stable, Patent Airway and No signs of Nausea or vomiting  Last Vitals:  Filed Vitals:   06/29/12 1234  BP: 135/86  Pulse: 84  Temp: 36.6 C  Resp: 16    Post-op Vital Signs: stable   Complications: No apparent anesthesia complications

## 2012-06-29 NOTE — Progress Notes (Signed)
Received orders for rw and commode.  Per patient's wife, they already have a rw and decline the commode.  No DME needs at this time.

## 2012-06-29 NOTE — Interval H&P Note (Signed)
History and Physical Interval Note:  06/29/2012 7:17 AM  Manuel Hood  has presented today for surgery, with the diagnosis of Presistantly Painful Right Total Knee/Left Knee Meniscal Tear and Chondromalasia  The various methods of treatment have been discussed with the patient and family. After consideration of risks, benefits and other options for treatment, the patient has consented to  Procedure(s): REVISION RIGHT TOTAL KNEE/REVISION FEMORAL COMPONENT POLY EXCHANGE/PATELLA LATERAL FACETECTOMY (Right) LEFT KNEE ARTHROSCOPY WITH DEBRIDEMENT AND MENISECTOMY (Left) as a surgical intervention .  The patient's history has been reviewed, patient examined, no change in status, stable for surgery.  I have reviewed the patient's chart and labs.  Questions were answered to the patient's satisfaction.     [name]

## 2012-06-29 NOTE — Brief Op Note (Signed)
06/29/2012  9:54 AM  PATIENT:  Manuel Hood  62 y.o. male  PRE-OPERATIVE DIAGNOSIS:  1. Presistantly Painful Right Total Knee replacement mainly laterally , 2.  Left knee Meniscal Tear and Chondromalacia  POST-OPERATIVE DIAGNOSIS:  1. Presistantly Painful Right Total Knee replacement mainly laterally , 2.  Left knee Meniscal Tear and Chondromalacia, grade 3-4 medial compartment, grade 2-3 patella apex  PROCEDURE:  Procedure(s): 1. REVISION RIGHT TOTAL KNEE/REVISION FEMORAL COMPONENT POLY EXCHANGE/PATELLA LATERAL FACETECTOMY, patella revision (Right) 2. LEFT KNEE ARTHROSCOPY WITH DEBRIDEMENT AND medial MENISECTOMY, medial and patellofemoral chondroplasty (Left)  SURGEON:  Surgeon(s) and Role:    * [name], MD - Primary  PHYSICIAN ASSISTANT: Lanney Gins, PA-C for the right knee procedure only  ANESTHESIA:   regional and general  EBL:  Total I/O In: 2000 [I.V.:2000] Out: 200 [Urine:100; Blood:100]  BLOOD ADMINISTERED:none  DRAINS: (1 medium) Hemovact drain(s) in the right knee with  Suction Open   LOCAL MEDICATIONS USED:  MARCAINE in the left knee  SPECIMEN:  No Specimen  DISPOSITION OF SPECIMEN:  N/A  COUNTS:  YES  TOURNIQUET:   Total Tourniquet Time Documented: Thigh (Right) - 48 minutes Total: Thigh (Right) - 48 minutes   DICTATION: .Other Dictation: Dictation Number [id number]  PLAN OF CARE: Admit to inpatient   PATIENT DISPOSITION:  PACU - hemodynamically stable.   Delay start of Pharmacological VTE agent (>24hrs) due to surgical blood loss or risk of bleeding: no

## 2012-06-29 NOTE — Transfer of Care (Signed)
Immediate Anesthesia Transfer of Care Note  Patient: Manuel Hood  Procedure(s) Performed: Procedure(s): REVISION RIGHT TOTAL KNEE/REVISION FEMORAL COMPONENT POLY EXCHANGE/PATELLA LATERAL FACETECTOMY (Right) LEFT KNEE ARTHROSCOPY WITH DEBRIDEMENT AND MENISECTOMY (Left)  Patient Location: PACU  Anesthesia Type:General  Level of Consciousness: sedated  Airway & Oxygen Therapy: Patient Spontanous Breathing and Patient connected to face mask oxygen  Post-op Assessment: Report given to PACU RN and Post -op Vital signs reviewed and stable  Post vital signs: Reviewed and stable  Complications: No apparent anesthesia complications

## 2012-06-30 ENCOUNTER — Encounter (HOSPITAL_COMMUNITY): Payer: Self-pay | Admitting: Orthopedic Surgery

## 2012-06-30 DIAGNOSIS — E669 Obesity, unspecified: Secondary | ICD-10-CM | POA: Diagnosis present

## 2012-06-30 DIAGNOSIS — D500 Iron deficiency anemia secondary to blood loss (chronic): Secondary | ICD-10-CM | POA: Diagnosis not present

## 2012-06-30 LAB — BASIC METABOLIC PANEL
BUN: 10 mg/dL (ref 6–23)
Calcium: 9.1 mg/dL (ref 8.4–10.5)
Creatinine, Ser: 0.81 mg/dL (ref 0.50–1.35)
GFR calc Af Amer: >90 mL/min (ref >90)
GFR calc non Af Amer: >90 mL/min (ref >90)
Potassium: 4.2 mEq/L (ref 3.5–5.1)

## 2012-06-30 LAB — CBC
HCT: 36.9 % — ABNORMAL LOW (ref 39.0–52.0)
MCHC: 33.3 g/dL (ref 30.0–36.0)
MCV: 95.6 fL (ref 78.0–100.0)
RDW: 12.4 % (ref 11.5–15.5)

## 2012-06-30 MED ORDER — ASPIRIN 325 MG PO TBEC: 325.0000 mg | DELAYED_RELEASE_TABLET | Freq: Two times a day (BID) | ORAL | Status: DC

## 2012-06-30 MED ORDER — METHOCARBAMOL 500 MG PO TABS: 500.0000 mg | ORAL_TABLET | Freq: Four times a day (QID) | ORAL | Status: DC | PRN

## 2012-06-30 MED ORDER — POLYETHYLENE GLYCOL 3350 17 G PO PACK: 17.0000 g | PACK | Freq: Two times a day (BID) | ORAL | Status: DC

## 2012-06-30 MED ORDER — OXYCODONE HCL 5 MG PO TABS: 5.0000 mg | ORAL_TABLET | ORAL | Status: DC | PRN

## 2012-06-30 MED ORDER — FERROUS SULFATE 325 (65 FE) MG PO TABS: 325.0000 mg | ORAL_TABLET | Freq: Three times a day (TID) | ORAL | Status: DC

## 2012-06-30 MED ORDER — DSS 100 MG PO CAPS: 100.0000 mg | ORAL_CAPSULE | Freq: Two times a day (BID) | ORAL | Status: AC

## 2012-06-30 NOTE — Progress Notes (Signed)
Physical Therapy Treatment Patient Details Name: GURSHAAN MATSUOKA MRN: [id number] DOB: 1950-05-25 Today's Date: 06/30/2012 Time:  -     PT Assessment / Plan / Recommendation Comments on Treatment Session       Follow Up Recommendations        Does the patient have the potential to tolerate intense rehabilitation     Barriers to Discharge        Equipment Recommendations       Recommendations for Other Services    Frequency     Plan      Precautions / Restrictions Precautions Precautions: Knee;Fall Required Braces or Orthoses: Knee Immobilizer - Right Knee Immobilizer - Right: On when out of bed or walking Restrictions Other Position/Activity Restrictions: WBAT   Pertinent Vitals/Pain 5-6/10; premed, ice packs provided.    Mobility  Transfers Sit to Stand: 4: Min assist;5: Supervision (min from chair--help RLE; supervision from commode) Stand to Sit: 5: Supervision Details for Transfer Assistance: no cues needed    Exercises     PT Diagnosis:    PT Problem List:   PT Treatment Interventions:     PT Goals    Visit Information  Assistance Needed: +1    Subjective Data      Cognition  Cognition Arousal/Alertness: Awake/alert Behavior During Therapy: WFL for tasks assessed/performed Overall Cognitive Status: Within Functional Limits for tasks assessed    Balance     End of Session     GP     Sakiya Stepka 06/30/2012, 1:37 PM

## 2012-06-30 NOTE — Op Note (Signed)
NAMESIMPSON, PAULOS                 ACCOUNT NO.:  [id number]  MEDICAL RECORD NO.:  [id number]  LOCATION:  1601                         FACILITY:  [facility]  PHYSICIAN:  Madlyn Frankel. Charlann Boxer, M.D.  DATE OF BIRTH:  [birth year]  DATE OF PROCEDURE:  06/29/2012 DATE OF DISCHARGE:                              OPERATIVE REPORT   PREOPERATIVE DIAGNOSIS: 1. Left knee medial meniscal tear associated with chondromalacia. 2. Persistently painful right total knee replacement, predominantly on     the lateral side of his knee.  POSTOPERATIVE DIAGNOSES/FINDINGS: 1. The left knee is significantly degenerated and macerated medial     meniscus. 2. Grade 3-4 chondromalacia in the medial compartment of the left     knee. 3. Grade 2-3 chondromalacia of the patella apex. 4. Persistently painful right total knee replacement, predominant on     the lateral side of the knee.  PROCEDURES: 1. Left knee diagnostic and operative arthroscopy with medial     meniscectomy. 2. Left knee arthroscopic surgery, medial and patellofemoral     chondroplasty, debridement of chondral flaps.  The medial     compartment did include abrasion chondroplasty on exposed areas of     bone on the medial aspect of tibial plateau surface and medial     femoral condyle. 3. Revision of right total knee replacement including the femoral     component polyethylene insert as well as the patella. 4. Right knee lateral facetectomy.  SURGEON:  Madlyn Frankel. Charlann Boxer, M.D.  ASSISTANT:  Lanney Gins, PAC.  Note that Mr. Carmon Sails was present for the entirety of the right knee procedure, which was done second, and he was involved with preoperative position, perioperative management, and operative extremity, and general facilitation of case in primary wound closure.  ANESTHESIA:  General plus preoperative regional femoral nerve block on the right side.  DRAINS:  One medium Hemovac.  COMPLICATIONS:  None.  TOURNIQUET TIME:  48 minutes at 250  mmHg.  INDICATIONS FOR PROCEDURE:  Mr. Cleopatra Cedar 62 year old male, who was kindly referred for surgical consideration of persistent painful right knee. Radiographic workup in the office including sunrise view of the knee indicating perhaps a lateral overhang of the facet as well as possible issues with the lateral aspect of the femoral component, nonetheless persistent pain despite conservative measures with physical therapy and injections.  At the same time, he presented with problems of the left knee with no medial meniscal pathology, and at this point, we wish to add that procedure to his right knee surgery.  We discussed the risks of his right knee of having persistent pain, stiffness, need for future revision surgery, and component failure, DVT.  We also discussed on left knee with potential progression of arthritis and eventual need for knee arthroplasty if at all fails.  Consent was obtained for above both procedures for pain relief.  PROCEDURE IN DETAIL:  The patient was brought to the operative theater. Once adequate anesthesia, preoperative antibiotics, Ancef 2 g administered including tranexamic acid, the patient was positioned supine.  Attention was first directed to the left knee arthroscopy.  The left lower extremity was positioned into a leg holder.  Left lower extremity was  then prepped and draped in sterile fashion.  Time-out was performed identifying the patient, planned procedure, and this procedure and this extremity.  Standard inferomedial, superomedial, and inferolateral portals were utilized.  Diagnostic evaluation of the knee revealed the above-noted findings.  Attention was first directed to the medial compartment through the inferior medial portal.  I utilized a combination of straight biting baskets and 3.5 Cuda shaver to remove all the macerated and degenerative medial meniscus back to a stable peripheral level.  There was also noted to be grade 3-4  chondromalacia on the medial compartment with some exposed bone on the medial aspect of the tibial plateau and femoral surfaces.  I did abrade this area to punctate bleeding.  I also removed the chondral flaps further stabilizing the medial aspect of this knee.  In the anterior aspect of knee, the 3.5 Cuda shaver was used to debride the chondral flaps on the patella apex.  The lateral compartment was noted be intact.  The ACL was intact.  Once I finished these procedures, I reexamined the knee to make certain I was satisfied with the debridement back to a stable level.  Once this was carried out, the instrumentation was removed from the knee.  Portal sites were reapproximated using 4-0 nylon.  At the end of the case, I injected the knee with 30 mL of 0.25% Marcaine with epinephrine.  At this point, the knee was dressed sterilely with Adaptic and a bulky sterile wrap.  This concluded this first procedure.  The knee was then wrapped in an Ace wrap.  He was taken out of the leg holder and attention now directed to the right knee.  The patient's right lower extremity was prepped and draped in the sterile fashion over the nonsterile thigh tourniquet.  Time-out was performed identifying the patient, this procedure, and this right extremity.  The right lower extremity was prepped and draped in sterile fashion. The leg was exsanguinated.  Tourniquet was elevated to 250 mmHg.  The patient's old midline incision was utilized.  The soft tissue planes were created.  A median arthrotomy was made encountering clear synovial fluid.  Following initial exposure, medial synovectomy was carried out up to the lateral gutter.  Attention was first directed to the patella.  When I was able to evert the patella, measured it with a caliper noted that it was 28 mm thick, which is somewhat thicker than what we would sometimes like to see, but also could be a source of some lateral pain.  I thus made the  decision to remove the patellar button and using a standard oscillating saw, I removed the patellar button with the remaining bone about 14 mm.  The old polyethylene portions of the cemented pegs were removed.  I then re-drilled for 41 patellar button restoring height at this point to 25 mm.  We kept the patellar button in place to protect it from the saw blades during the remainder of the case.  At this point, I attended to lateral scar.  I removed a lot of scarring over the lateral aspect of the tibial tray area, up to the lateral aspect of the femur.  By palpation, noted that there was a little bit overhang on lateral aspect of the femur and as I preoperatively discussed with him the portion of my plan was to revise the femoral component and down size it.  At this point, using a thin ACL saw, we undermined the bone-cement interface at the anterior flange, medial and  lateral, and distal medial and lateral.  Then, using osteotomes, we were able to remove the femoral component without bone loss.  I then debrided any remaining cement and any other nonviable tissues in this area, and then reamed hand by hand up to 15 mm in case I was to use a 13 mm cemented stem.  Once I determined that this was stable, I went ahead and placed a 14 mm reamer inside the femoral canal and revisited the distal cut, which did not require any bone removal, and there was no bone loss.  I then placed the size 4 cutting block and set rotation off the tibial tray, which was noted radiographically to be perpendicular.  I then revisited these cuts with a +2 adapter set removing 1-2 mm of bone off the anterior aspect of the femur, but there was no notching. There was none to very little bone removed off the posterior condyles, but no augments required.  I revisited chamfer cuts.  We then cut the femur at this point for TC3 femur to get a little bit more component cement interface, and this only required  removal of a couple of millimeters of bone in the notch.  We then did a trial reduction at this point.  When I did the trial reduction, __________ femur was placed, and I ended up trialing up to a 15 mm insert to get a stable knee.  I had removed the 10 mm insert. Again, no bone was removed off the posterior aspect of the femur so as to affect the flexion gap, but I think just by sheer debridement of the knee itself, we got this 15 mm.  This provided a very stable knee from extension to flexion.  The knee and the patellar button tracked through the trochlea without application of pressure.  At this point, I felt like I had a knee that tracked well and was positioned as well as I would expect in a primary knee.  I thus removed all the trial components.  We opened the final components.  The knee was irrigated with normal saline solution and pulse lavaged.  Cement was mixed on the back table.  The final femoral component was then cemented into position, and the final 15 mm insert was opened and placed into the knee, and the knee was brought to extension to allow the cement to fully cure.  Once the cement had cured, all cement was removed from around the patella and the femur.  At this point, the knee was re-irrigated.  The medium Hemovac drain was placed deep.  The extensor mechanism was then reapproximated using #1 Vicryl and 0 V-Loc.  The remainder of the wound was closed with 2-0 Vicryl and running 4-0 Monocryl.  The knee was cleaned, dried, and dressed sterilely using Dermabond and Aquacel dressing.  Drain site dressed separately.  The patient was then brought to the recovery room in stable condition tolerating the procedure well.     Madlyn Frankel Charlann Boxer, M.D.     MDO/MEDQ  D:  06/29/2012  T:  06/30/2012  Job:  [id number]

## 2012-06-30 NOTE — Progress Notes (Signed)
Physical Therapy Treatment Patient Details Name: Manuel Hood MRN: [id number] DOB: [birth year] Today's Date: 06/30/2012 Time: 4098-1191 PT Time Calculation (min): 48 min  PT Assessment / Plan / Recommendation Comments on Treatment Session  Reviewed don/doff KI, car transfers, and home ther ex program with pt and spouse    Follow Up Recommendations  No PT follow up     Does the patient have the potential to tolerate intense rehabilitation     Barriers to Discharge        Equipment Recommendations  None recommended by PT    Recommendations for Other Services OT consult  Frequency 7X/week   Plan Discharge plan remains appropriate    Precautions / Restrictions Precautions Precautions: Knee;Fall Required Braces or Orthoses: Knee Immobilizer - Right Knee Immobilizer - Right: On when out of bed or walking Restrictions Weight Bearing Restrictions: No Other Position/Activity Restrictions: WBAT   Pertinent Vitals/Pain     Mobility  Bed Mobility Bed Mobility: Supine to Sit;Sit to Supine Supine to Sit: 4: Min assist Sit to Supine: 4: Min guard Details for Bed Mobility Assistance: cues for sequence and use of L LE to self assist Transfers Transfers: Sit to Stand;Stand to Sit Sit to Stand: 4: Min guard;5: Supervision Stand to Sit: 5: Supervision Details for Transfer Assistance: cues for hand placement Ambulation/Gait Ambulation/Gait Assistance: 4: Min guard;5: Supervision Ambulation Distance (Feet): 55 Feet Assistive device: Rolling walker Ambulation/Gait Assistance Details: min cues for posture and position from RW Gait Pattern: Step-to pattern;Decreased stance time - right Stairs: Yes Stairs Assistance: 4: Min assist Stairs Assistance Details (indicate cue type and reason): cues for sequence and foot/crutch placement Stair Management Technique: One rail Right;Step to pattern;Forwards;With crutches Number of Stairs: 4    Exercises     PT Diagnosis:    PT Problem List:    PT Treatment Interventions:     PT Goals Acute Rehab PT Goals PT Goal Formulation: With patient Time For Goal Achievement: 07/05/12 Potential to Achieve Goals: Good Pt will go Supine/Side to Sit: with supervision PT Goal: Supine/Side to Sit - Progress: Progressing toward goal Pt will go Sit to Supine/Side: with supervision PT Goal: Sit to Supine/Side - Progress: Progressing toward goal Pt will go Sit to Stand: with supervision PT Goal: Sit to Stand - Progress: Progressing toward goal Pt will go Stand to Sit: with supervision PT Goal: Stand to Sit - Progress: Progressing toward goal Pt will Ambulate: 51 - 150 feet;with supervision;with rolling walker PT Goal: Ambulate - Progress: Met Pt will Go Up / Down Stairs: 6-9 stairs;with min assist;with least restrictive assistive device PT Goal: Up/Down Stairs - Progress: Progressing toward goal  Visit Information  Last PT Received On: 06/30/12 Assistance Needed: +1    Subjective Data  Subjective: I need you to show my wife some things...Marland KitchenMarland KitchenMarland Kitchen Patient Stated Goal: Resume previous active lifestyle   Cognition  Cognition Arousal/Alertness: Awake/alert Behavior During Therapy: WFL for tasks assessed/performed Overall Cognitive Status: Within Functional Limits for tasks assessed    Balance     End of Session PT - End of Session Equipment Utilized During Treatment: Gait belt;Right knee immobilizer Activity Tolerance: Patient tolerated treatment well Patient left: in bed;with call bell/phone within reach;with family/visitor present Nurse Communication: Mobility status   GP     Manuel Hood 06/30/2012, 5:05 PM

## 2012-06-30 NOTE — Progress Notes (Signed)
Utilization review completed.  

## 2012-06-30 NOTE — Evaluation (Signed)
Occupational Therapy Evaluation Patient Details Name: PLES TRUDEL MRN: [id number] DOB: Jun 14, 1950 Today's Date: 06/30/2012 Time: 5956-3875 OT Time Calculation (min): 13 min  OT Assessment / Plan / Recommendation Clinical Impression  This 62 year old man was admitted for R TKA and repair of L meniscus.  All education was completed.  Pt does not need any further OT nor bathroom DME.      OT Assessment  Patient does not need any further OT services    Follow Up Recommendations  No OT follow up    Barriers to Discharge      Equipment Recommendations  None recommended by OT    Recommendations for Other Services    Frequency       Precautions / Restrictions Precautions Precautions: Knee;Fall Required Braces or Orthoses: Knee Immobilizer - Right Knee Immobilizer - Right: On when out of bed or walking Restrictions Weight Bearing Restrictions: No Other Position/Activity Restrictions: WBAT   Pertinent Vitals/Pain RLE sore--reapplied ice to Bil knees after repositioning    ADL  Grooming: Supervision/safety Where Assessed - Grooming: Supported standing Toilet Transfer: Supervision/safety Statistician Method: Sit to Barista: Comfort height toilet Equipment Used: Knee Immobilizer;Rolling walker Transfers/Ambulation Related to ADLs: ambulated to bathroom with supervision.  Pt able to get up from comfort height commode at supervision level:  doesn't feel he will need anything over toilet ADL Comments: pt is able to perform ub adls at set up level and min A for LB ADLs    OT Diagnosis:    OT Problem List:   OT Treatment Interventions:     OT Goals    Visit Information  Last OT Received On: 06/30/12 Assistance Needed: +1    Subjective Data  Subjective: I'll be OK with my toilet.  There's a sink I can reach over and turn on the water Patient Stated Goal: none stated.  Agreeable to OT   Prior Functioning     Home Living Lives With:  Spouse Available Help at Discharge: Family Bathroom Shower/Tub: Tub/shower unit Bathroom Toilet: Standard Home Adaptive Equipment: Environmental consultant - rolling;Straight cane;Crutches Additional Comments: will sponge bathe initially Prior Function Level of Independence: Independent Able to Take Stairs?: Yes Driving: Yes Vocation: Part time employment Communication Communication: No difficulties Dominant Hand: Right         Vision/Perception     Cognition  Cognition Arousal/Alertness: Awake/alert Behavior During Therapy: WFL for tasks assessed/performed Overall Cognitive Status: Within Functional Limits for tasks assessed    Extremity/Trunk Assessment Right Upper Extremity Assessment RUE ROM/Strength/Tone: Within functional levels Left Upper Extremity Assessment LUE ROM/Strength/Tone: Within functional levels     Mobility Transfers Sit to Stand: 4: Min assist;5: Supervision (min from chair--help RLE; supervision from commode) Stand to Sit: 5: Supervision Details for Transfer Assistance: no cues needed     Exercise     Balance     End of Session OT - End of Session Activity Tolerance: Patient tolerated treatment well Patient left: in chair;with call bell/phone within reach  GO     Shirleyann Montero 06/30/2012, 11:05 AM Marica Otter, OTR/L [phone] 06/30/2012

## 2012-06-30 NOTE — Progress Notes (Signed)
   Subjective: 1 Day Post-Op Procedure(s) (LRB): REVISION RIGHT TOTAL KNEE/REVISION FEMORAL COMPONENT POLY EXCHANGE/PATELLA LATERAL FACETECTOMY (Right) LEFT KNEE ARTHROSCOPY WITH DEBRIDEMENT AND MENISECTOMY (Left)   Patient reports pain as mild, pain well controlled. No events throughout the night. Ready to be discharged home if he does well with PT and pain well controlled.   Objective:   VITALS:   Filed Vitals:   06/30/12 0410  BP: 118/75  Pulse: 76  Temp: 98.1 F (36.7 C)  Resp: 16    Neurovascular intact Dorsiflexion/Plantar flexion intact Incision: dressing C/D/I No cellulitis present Compartment soft  LABS  Recent Labs  06/30/12 0410  HGB 12.3*  HCT 36.9*  WBC 15.8*  PLT 208     Recent Labs  06/30/12 0410  NA 139  K 4.2  BUN 10  CREATININE 0.81  GLUCOSE 141*     Assessment/Plan: 1 Day Post-Op Procedure(s) (LRB): REVISION RIGHT TOTAL KNEE/REVISION FEMORAL COMPONENT POLY EXCHANGE/PATELLA LATERAL FACETECTOMY (Right) LEFT KNEE ARTHROSCOPY WITH DEBRIDEMENT AND MENISECTOMY (Left) HV drain d/c'ed Foley cath d/c'ed Advance diet Up with therapy D/C IV fluids Discharge home with home health Follow up in 2 weeks at [facility]. Follow up with OLIN,Johnella Crumm D in 2 weeks.  Contact information:  [facility] [address], [address] [place] [address] [phone]    Expected ABLA  Treated with iron and will observe  Obese (BMI 30-39.9) Estimated body mass index is 31.24 kg/(m^2) as calculated from the following:   Height as of this encounter: 5\' 9"  (1.753 m).   Weight as of this encounter: 96 kg (211 lb 10.3 oz). Patient also counseled that weight may inhibit the healing process Patient counseled that losing weight will help with future health issues       Anastasio Auerbach. Rudean Icenhour   PAC  06/30/2012, 9:06 AM

## 2012-06-30 NOTE — Care Management Note (Signed)
    Page 1 of 1   06/30/2012     4:06:50 PM   CARE MANAGEMENT NOTE 06/30/2012  Patient:  KAYDN, KUMPF   Account Number:  [id number]  Date Initiated:  06/30/2012  Documentation initiated by:  Colleen Can  Subjective/Objective Assessment:   dx revision rt total knee, arthroscopy of left total knee     Action/Plan:   Home with spouse as caregiver. Pt already has DME. Pt has declined HH PT services. States he knows what to do and wife will help him with exercises.   Anticipated DC Date:  06/30/2012   Anticipated DC Plan:  HOME/SELF CARE      DC Planning Services  CM consult      Choice offered to / List presented to:          [facility] arranged  HH - 11 Patient Refused      Status of service:  Completed, signed off Medicare Important Message given?   (If response is "NO", the following Medicare IM given date fields will be blank) Date Medicare IM given:   Date Additional Medicare IM given:    Discharge Disposition:  HOME/SELF CARE  Per UR Regulation:    If discussed at Long Length of Stay Meetings, dates discussed:    Comments:  06/30/2012 Colleen Can BSN Rn CCm [phone] PA-[place]ish notified of pt. decision not to have HHpt services.

## 2012-07-04 NOTE — Discharge Summary (Signed)
Physician Discharge Summary  Patient ID: Manuel Hood MRN: [id number] DOB/AGE: [birth year] 62 y.o.  Admit date: 06/29/2012 Discharge date: 06/30/2012   Procedures:  Procedure(s) (LRB): REVISION RIGHT TOTAL KNEE/REVISION FEMORAL COMPONENT POLY EXCHANGE/PATELLA LATERAL FACETECTOMY (Right) LEFT KNEE ARTHROSCOPY WITH DEBRIDEMENT AND MENISECTOMY (Left)  Attending Physician:  Dr. Durene Romans   Admission Diagnoses:   Failed right total knee prosthesis and left knee meniscal tear / pain.  Discharge Diagnoses:  Principal Problem:   S/P right TKA Active Problems:   S/P left knee arthroscopy   Expected blood loss anemia   Obese  Past Medical History  Diagnosis Date  . Hypertension   . Sinus pain   . Seasonal allergies   . GERD (gastroesophageal reflux disease)   . Arthritis     HPI:   Manuel Hood, 62 y.o. male, has a history of pain in the right knee which was previously replaced by Dr. Shelle Iron in January 2013. He states that the knee has been hurting since that time. His symptoms appear to mainly be popping and pain over the lateral femoral condyle area. Patient has failed non-surgical conservative treatments for greater than 12 weeks to include NSAID's and/or analgesics, corticosteriod injections, supervised PT with diminished ADL's post treatment and activity modification. The indications for the revision of the total knee arthroplasty are pain over the lateral aspect of the femoral condyle and possible area of impingement on the lateral patella region. Onset of symptoms was abrupt starting 1.5 years ago with gradually worsening course since that time. Prior procedures on the right knee(s) include arthroplasty. Patient currently rates pain in the right knee(s) at 10 out of 10 with activity. There is night pain, worsening of pain with activity and weight bearing, pain that interferes with activities of daily living, pain with passive range of motion, crepitus and joint swelling. Patient  has evidence of possible impingement of an area on the lateral patella area. This condition presents safety issues increasing the risk of falls. There are no current signs of active infection. He has also been compensating with the left knee because of the right knee pain. Do to pain in the left knee an MRI was obtained which revealed a meniscus tear. Dr. Charlann Boxer has discussed fixing this as well and he wishes to proceed with both surgeries. Risks, benefits and expectations were discussed with the patient. Patient understand the risks, benefits and expectations and wishes to proceed with surgery.  PCP: Mickie Hillier, MD   Discharged Condition: good  Hospital Course:  Patient underwent the above stated procedure on 06/29/2012. Patient tolerated the procedure well and brought to the recovery room in good condition and subsequently to the floor.  POD #1 BP: 118/75 ; Pulse: 76 ; Temp: 98.1 F (36.7 C) ; Resp: 16 Pt's foley was removed, as well as the hemovac drain removed. IV was changed to a saline lock. Patient reports pain as mild, pain well controlled. No events throughout the night. Ready to be discharged home Neurovascular intact, dorsiflexion/plantar flexion intact, incision: dressing C/D/I, no cellulitis present and compartment soft.   LABS  Basename  06/30/12    0410   HGB  12.3  HCT  36.9    Discharge Exam: General appearance: alert, cooperative and no distress Extremities: Homans sign is negative, no sign of DVT, no edema, redness or tenderness in the calves or thighs and no ulcers, gangrene or trophic changes  Disposition:   Home-Health Care Svc with follow up in 2 weeks  Follow-up Information   Follow up with [name], MD. Schedule an appointment as soon as possible for a visit in 2 weeks.   Contact information:   [address] [place] [address] [phone]       Discharge Orders   Future Orders Complete By Expires     Call MD / Call 911  As  directed     Comments:      If you experience chest pain or shortness of breath, CALL 911 and be transported to the hospital emergency room.  If you develope a fever above 101 F, pus (white drainage) or increased drainage or redness at the wound, or calf pain, call your surgeon's office.    Change dressing  As directed     Comments:      Maintain right knee surgical dressing for 10-14 days, then replace with gauze and tape. Left knee daily dressing changes, may use Band-Aid over the 3 incision. Keep the area dry and clean.    Constipation Prevention  As directed     Comments:      Drink plenty of fluids.  Prune juice may be helpful.  You may use a stool softener, such as Colace (over the counter) 100 mg twice a day.  Use MiraLax (over the counter) for constipation as needed.    Diet - low sodium heart healthy  As directed     Discharge instructions  As directed     Comments:      Maintain right knee surgical dressing for 10-14 days, then replace with gauze and tape. Left knee daily dressing changes, may use Band-Aid over the 3 incision. Keep the area dry and clean until follow up. Follow up in 2 weeks at [facility]. Call with any questions or concerns.    Driving restrictions  As directed     Comments:      No driving for 4 weeks    Increase activity slowly as tolerated  As directed     TED hose  As directed     Comments:      Use stockings (TED hose) for 2 weeks on both leg(s).  You may remove them at night for sleeping.    Weight bearing as tolerated  As directed          Medication List    STOP taking these medications       ibuprofen 200 MG tablet  Commonly known as:  ADVIL,MOTRIN     oxyCODONE-acetaminophen 5-325 MG per tablet  Commonly known as:  PERCOCET/ROXICET      TAKE these medications       aspirin 325 MG EC tablet  Take 1 tablet (325 mg total) by mouth 2 (two) times daily.     calcium carbonate 600 MG Tabs  Commonly known as:  OS-CAL  Take 600 mg  by mouth daily.     cholecalciferol 1000 UNITS tablet  Commonly known as:  VITAMIN D  Take 1,000 Units by mouth daily.     dextromethorphan-guaiFENesin 30-600 MG per 12 hr tablet  Commonly known as:  MUCINEX DM  Take 1 tablet by mouth every 12 (twelve) hours.     DSS 100 MG Caps  Take 100 mg by mouth 2 (two) times daily.     ferrous sulfate 325 (65 FE) MG tablet  Take 1 tablet (325 mg total) by mouth 3 (three) times daily after meals.     losartan 100 MG tablet  Commonly known as:  COZAAR  Take 100 mg by mouth daily before breakfast.     methocarbamol 500 MG tablet  Commonly known as:  ROBAXIN  Take 1 tablet (500 mg total) by mouth every 6 (six) hours as needed (muscle spasms).     ONE-A-DAY 50 PLUS PO  Take 1 tablet by mouth daily.     oxyCODONE 5 MG immediate release tablet  Commonly known as:  Oxy IR/ROXICODONE  Take 1-3 tablets (5-15 mg total) by mouth every 4 (four) hours as needed for pain.     polyethylene glycol packet  Commonly known as:  MIRALAX / GLYCOLAX  Take 17 g by mouth 2 (two) times daily.     vitamin C 1000 MG tablet  Take 1,000 mg by mouth daily.         Signed: Anastasio Auerbach. Fusako Tanabe   PAC  07/04/2012, 4:02 PM

## 2013-09-13 ENCOUNTER — Other Ambulatory Visit: Payer: Self-pay | Admitting: Family Medicine

## 2013-09-13 DIAGNOSIS — R109 Unspecified abdominal pain: Secondary | ICD-10-CM

## 2013-09-14 ENCOUNTER — Ambulatory Visit
Admission: RE | Admit: 2013-09-14 | Discharge: 2013-09-14 | Disposition: A | Discharge status: Home / Self Care | Payer: BC Managed Care – PPO | Source: Ambulatory Visit | Attending: Family Medicine | Admitting: Family Medicine

## 2013-09-14 DIAGNOSIS — R109 Unspecified abdominal pain: Secondary | ICD-10-CM

## 2013-09-14 MED ORDER — IOHEXOL 300 MG/ML  SOLN
125.0000 mL | Freq: Once | INTRAMUSCULAR | Status: AC | PRN
  Administered 2013-09-14: 125 mL via INTRAVENOUS

## 2016-04-05 DIAGNOSIS — M7542 Impingement syndrome of left shoulder: Secondary | ICD-10-CM | POA: Diagnosis not present

## 2016-04-05 DIAGNOSIS — M7541 Impingement syndrome of right shoulder: Secondary | ICD-10-CM | POA: Diagnosis not present

## 2016-04-16 DIAGNOSIS — Z8719 Personal history of other diseases of the digestive system: Secondary | ICD-10-CM | POA: Diagnosis not present

## 2016-04-16 DIAGNOSIS — Z0000 Encounter for general adult medical examination without abnormal findings: Secondary | ICD-10-CM | POA: Diagnosis not present

## 2016-04-16 DIAGNOSIS — Z23 Encounter for immunization: Secondary | ICD-10-CM | POA: Diagnosis not present

## 2016-04-16 DIAGNOSIS — R8290 Unspecified abnormal findings in urine: Secondary | ICD-10-CM | POA: Diagnosis not present

## 2016-04-16 DIAGNOSIS — Z86010 Personal history of colonic polyps: Secondary | ICD-10-CM | POA: Diagnosis not present

## 2016-04-16 DIAGNOSIS — E785 Hyperlipidemia, unspecified: Secondary | ICD-10-CM | POA: Diagnosis not present

## 2016-04-16 DIAGNOSIS — Z125 Encounter for screening for malignant neoplasm of prostate: Secondary | ICD-10-CM | POA: Diagnosis not present

## 2016-04-16 DIAGNOSIS — N529 Male erectile dysfunction, unspecified: Secondary | ICD-10-CM | POA: Diagnosis not present

## 2016-04-16 DIAGNOSIS — D229 Melanocytic nevi, unspecified: Secondary | ICD-10-CM | POA: Diagnosis not present

## 2016-04-16 DIAGNOSIS — I10 Essential (primary) hypertension: Secondary | ICD-10-CM | POA: Diagnosis not present

## 2016-06-23 DIAGNOSIS — M5441 Lumbago with sciatica, right side: Secondary | ICD-10-CM | POA: Diagnosis not present

## 2016-07-02 ENCOUNTER — Other Ambulatory Visit: Payer: Self-pay | Admitting: Orthopedic Surgery

## 2016-07-02 DIAGNOSIS — M79604 Pain in right leg: Secondary | ICD-10-CM

## 2016-07-05 ENCOUNTER — Ambulatory Visit
Admission: RE | Admit: 2016-07-05 | Discharge: 2016-07-05 | Disposition: A | Discharge status: Home / Self Care | Payer: Medicare HMO | Source: Ambulatory Visit | Attending: Orthopedic Surgery | Admitting: Orthopedic Surgery

## 2016-07-05 DIAGNOSIS — M79604 Pain in right leg: Secondary | ICD-10-CM

## 2016-07-05 DIAGNOSIS — M5126 Other intervertebral disc displacement, lumbar region: Secondary | ICD-10-CM | POA: Diagnosis not present

## 2016-07-13 DIAGNOSIS — M5441 Lumbago with sciatica, right side: Secondary | ICD-10-CM | POA: Diagnosis not present

## 2016-07-15 DIAGNOSIS — M5441 Lumbago with sciatica, right side: Secondary | ICD-10-CM | POA: Diagnosis not present

## 2016-07-20 DIAGNOSIS — M5416 Radiculopathy, lumbar region: Secondary | ICD-10-CM | POA: Diagnosis not present

## 2016-07-20 DIAGNOSIS — M5126 Other intervertebral disc displacement, lumbar region: Secondary | ICD-10-CM | POA: Diagnosis not present

## 2016-07-29 DIAGNOSIS — M5126 Other intervertebral disc displacement, lumbar region: Secondary | ICD-10-CM | POA: Diagnosis not present

## 2016-07-29 DIAGNOSIS — M5416 Radiculopathy, lumbar region: Secondary | ICD-10-CM | POA: Diagnosis not present

## 2016-08-16 DIAGNOSIS — M5126 Other intervertebral disc displacement, lumbar region: Secondary | ICD-10-CM | POA: Diagnosis not present

## 2016-08-16 DIAGNOSIS — M5416 Radiculopathy, lumbar region: Secondary | ICD-10-CM | POA: Diagnosis not present

## 2016-08-23 DIAGNOSIS — M5416 Radiculopathy, lumbar region: Secondary | ICD-10-CM | POA: Diagnosis not present

## 2016-09-14 ENCOUNTER — Other Ambulatory Visit: Payer: Self-pay | Admitting: Orthopedic Surgery

## 2016-09-16 DIAGNOSIS — M5416 Radiculopathy, lumbar region: Secondary | ICD-10-CM | POA: Diagnosis not present

## 2016-09-21 NOTE — Pre-Procedure Instructions (Signed)
Manuel Hood  09/21/2016      [facility] - [place] - [address] [address] [place] [address] Phone: [phone] Fax: [phone]    Your procedure is scheduled on Wednesday, September 29, 2016  Report to [facility] Admitting Entrance "A" at 8:00 A.M.   Call this number if you have problems the morning of surgery:  [phone]   Remember:  Do not eat food or drink liquids after midnight on September 28, 2016  Take these medicines the morning of surgery with A SIP OF WATER: Gabapentin (NEURONTIN). If needed TraMADol (ULTRAM) for pain and Fluticasone (FLONASE) for allergies.  7 days before surgery stop taking all Aspirins, Vitamins, Fish oils, and Herbal medications. Also stop all NSAIDS i.e. Advil, Motrin, Aleve, Anaprox, Naproxen, BC and Goody Powders.   Do not wear jewelry.  Do not wear lotions, powders, or perfumes, or deoderant.  Do not shave 48 hours prior to surgery.  Men may shave face and neck.  Do not bring valuables to the hospital.  [facility] is not responsible for any belongings or valuables.  Contacts, dentures or bridgework may not be worn into surgery.  Leave your suitcase in the car.  After surgery it may be brought to your room.  For patients admitted to the hospital, discharge time will be determined by your treatment team.  Patients discharged the day of surgery will not be allowed to drive home.   Special instructions:   [facility]- Preparing For Surgery  Before surgery, you can play an important role. Because skin is not sterile, your skin needs to be as free of germs as possible. You can reduce the number of germs on your skin by washing with CHG (chlorahexidine gluconate) Soap before surgery.  CHG is an antiseptic cleaner which kills germs and bonds with the skin to continue killing germs even after washing.  Please do not use if you have an allergy to CHG or antibacterial  soaps. If your skin becomes reddened/irritated stop using the CHG.  Do not shave (including legs and underarms) for at least 48 hours prior to first CHG shower. It is OK to shave your face.  Please follow these instructions carefully.   1. Shower the NIGHT BEFORE SURGERY and the MORNING OF SURGERY with CHG.   2. If you chose to wash your hair, wash your hair first as usual with your normal shampoo.  3. After you shampoo, rinse your hair and body thoroughly to remove the shampoo.  4. Use CHG as you would any other liquid soap. You can apply CHG directly to the skin and wash gently with a scrungie or a clean washcloth.   5. Apply the CHG Soap to your body ONLY FROM THE NECK DOWN.  Do not use on open wounds or open sores. Avoid contact with your eyes, ears, mouth and genitals (private parts). Wash genitals (private parts) with your normal soap.  6. Wash thoroughly, paying special attention to the area where your surgery will be performed.  7. Thoroughly rinse your body with warm water from the neck down.  8. DO NOT shower/wash with your normal soap after using and rinsing off the CHG Soap.  9. Pat yourself dry with a CLEAN TOWEL.   10. Wear CLEAN PAJAMAS   11. Place CLEAN SHEETS on your bed the night of your first shower and DO NOT SLEEP WITH PETS.  Day of Surgery: Do  not apply any deodorants/lotions. Please wear clean clothes to the hospital/surgery center.    Please read over the following fact sheets that you were given. Pain Booklet, Coughing and Deep Breathing, Blood Transfusion Information, MRSA Information and Surgical Site Infection Prevention

## 2016-09-22 ENCOUNTER — Encounter (HOSPITAL_COMMUNITY)
Admission: RE | Admit: 2016-09-22 | Discharge: 2016-09-22 | Disposition: A | Discharge status: Home / Self Care | Payer: Medicare HMO | Source: Ambulatory Visit | Attending: Orthopedic Surgery | Admitting: Orthopedic Surgery

## 2016-09-22 ENCOUNTER — Ambulatory Visit (HOSPITAL_COMMUNITY)
Admission: RE | Admit: 2016-09-22 | Discharge: 2016-09-22 | Disposition: A | Discharge status: Home / Self Care | Payer: Medicare HMO | Source: Ambulatory Visit | Attending: Orthopedic Surgery | Admitting: Orthopedic Surgery

## 2016-09-22 ENCOUNTER — Encounter (HOSPITAL_COMMUNITY): Payer: Self-pay

## 2016-09-22 DIAGNOSIS — Z01812 Encounter for preprocedural laboratory examination: Secondary | ICD-10-CM | POA: Insufficient documentation

## 2016-09-22 DIAGNOSIS — Z01818 Encounter for other preprocedural examination: Secondary | ICD-10-CM

## 2016-09-22 DIAGNOSIS — I700 Atherosclerosis of aorta: Secondary | ICD-10-CM | POA: Insufficient documentation

## 2016-09-22 DIAGNOSIS — Z01810 Encounter for preprocedural cardiovascular examination: Secondary | ICD-10-CM | POA: Insufficient documentation

## 2016-09-22 LAB — CBC WITH DIFFERENTIAL/PLATELET
BASOS ABS: 0 10*3/uL (ref 0.0–0.1)
Basophils Relative: 0 %
EOS PCT: 6 %
Eosinophils Absolute: 0.5 10*3/uL (ref 0.0–0.7)
HCT: 47.8 % (ref 39.0–52.0)
HEMOGLOBIN: 16.3 g/dL (ref 13.0–17.0)
LYMPHS ABS: 1.7 10*3/uL (ref 0.7–4.0)
LYMPHS PCT: 21 %
MCH: 32.1 pg (ref 26.0–34.0)
MCHC: 34.1 g/dL (ref 30.0–36.0)
MCV: 94.1 fL (ref 78.0–100.0)
Monocytes Absolute: 0.6 10*3/uL (ref 0.1–1.0)
Monocytes Relative: 8 %
NEUTROS PCT: 65 %
Neutro Abs: 5.1 10*3/uL (ref 1.7–7.7)
PLATELETS: 236 10*3/uL (ref 150–400)
RBC: 5.08 MIL/uL (ref 4.22–5.81)
RDW: 12.6 % (ref 11.5–15.5)
WBC: 7.9 10*3/uL (ref 4.0–10.5)

## 2016-09-22 LAB — URINALYSIS, ROUTINE W REFLEX MICROSCOPIC
BILIRUBIN URINE: NEGATIVE
Glucose, UA: NEGATIVE mg/dL
HGB URINE DIPSTICK: NEGATIVE
KETONES UR: NEGATIVE mg/dL
LEUKOCYTES UA: NEGATIVE
Nitrite: NEGATIVE
PROTEIN: 30 mg/dL — AB
Specific Gravity, Urine: 1.029 (ref 1.005–1.030)
Squamous Epithelial / LPF: NONE SEEN
pH: 5 (ref 5.0–8.0)

## 2016-09-22 LAB — COMPREHENSIVE METABOLIC PANEL
ALK PHOS: 82 U/L (ref 38–126)
ALT: 41 U/L (ref 17–63)
AST: 29 U/L (ref 15–41)
Albumin: 3.9 g/dL (ref 3.5–5.0)
Anion gap: 7 (ref 5–15)
BUN: 16 mg/dL (ref 6–20)
CALCIUM: 9.8 mg/dL (ref 8.9–10.3)
CHLORIDE: 107 mmol/L (ref 101–111)
CO2: 26 mmol/L (ref 22–32)
CREATININE: 0.88 mg/dL (ref 0.61–1.24)
GFR calc Af Amer: >60 mL/min (ref >60)
Glucose, Bld: 98 mg/dL (ref 65–99)
Potassium: 4 mmol/L (ref 3.5–5.1)
SODIUM: 140 mmol/L (ref 135–145)
Total Bilirubin: 0.8 mg/dL (ref 0.3–1.2)
Total Protein: 6.7 g/dL (ref 6.5–8.1)

## 2016-09-22 LAB — SURGICAL PCR SCREEN
MRSA, PCR: NEGATIVE
Staphylococcus aureus: NEGATIVE

## 2016-09-22 LAB — TYPE AND SCREEN
ABO/RH(D): O POS
ANTIBODY SCREEN: NEGATIVE

## 2016-09-22 LAB — PROTIME-INR
INR: 1
Prothrombin Time: 13.2 seconds (ref 11.4–15.2)

## 2016-09-22 LAB — ABO/RH: ABO/RH(D): O POS

## 2016-09-22 LAB — APTT: APTT: 27 s (ref 24–36)

## 2016-09-22 NOTE — Progress Notes (Addendum)
[facility]  Cardiologist - Denies  Chest x-ray - 09/22/16  EKG - 09/22/16  Stress Test - Requested  ECHO - Denies  Cardiac Cath - Denies  Sleep Study - Denies- (+)Postive STOP BANG- Sent to PCP CPAP - None    Pt denies having chest pain, sob, or fever at this time. All instructions explained to the pt, with a verbal understanding of the material. Pt agrees to go over the instructions while at home for a better understanding. The opportunity to ask questions was provided.

## 2016-09-22 NOTE — Progress Notes (Signed)
   09/22/16 0829  OBSTRUCTIVE SLEEP APNEA  Have you ever been diagnosed with sleep apnea through a sleep study? No  Do you snore loudly (loud enough to be heard through closed doors)?  1  Do you often feel tired, fatigued, or sleepy during the daytime (such as falling asleep during driving or talking to someone)? 0  Has anyone observed you stop breathing during your sleep? 0  Do you have, or are you being treated for high blood pressure? 1  BMI more than 35 kg/m2? 0  Age > 50 (1-yes) 1  Neck circumference greater than:Male 16 inches or larger, Male 17inches or larger? 1  Male Gender (Yes=1) 1  Obstructive Sleep Apnea Score 5  Score 5 or greater  Results sent to PCP

## 2016-09-27 NOTE — Progress Notes (Signed)
Call to [facility] grp. , spoke with Tammy,requested any cardiac studies but she reports that there is no record of one.  She will send an office note to [phone].

## 2016-09-29 ENCOUNTER — Inpatient Hospital Stay (HOSPITAL_COMMUNITY): Payer: Medicare HMO | Admitting: Anesthesiology

## 2016-09-29 ENCOUNTER — Inpatient Hospital Stay (HOSPITAL_COMMUNITY)
Admission: RE | Disposition: A | Discharge status: Home / Self Care | Payer: Self-pay | Source: Ambulatory Visit | Attending: Orthopedic Surgery

## 2016-09-29 ENCOUNTER — Inpatient Hospital Stay (HOSPITAL_COMMUNITY): Payer: Medicare HMO | Admitting: Emergency Medicine

## 2016-09-29 ENCOUNTER — Inpatient Hospital Stay (HOSPITAL_COMMUNITY): Payer: Medicare HMO

## 2016-09-29 ENCOUNTER — Encounter (HOSPITAL_COMMUNITY): Payer: Self-pay | Admitting: Urology

## 2016-09-29 ENCOUNTER — Inpatient Hospital Stay (HOSPITAL_COMMUNITY)
Admission: RE | Admit: 2016-09-29 | Discharge: 2016-09-30 | DRG: 455 | Disposition: A | Discharge status: Home / Self Care | Payer: Medicare HMO | Source: Ambulatory Visit | Attending: Orthopedic Surgery | Admitting: Orthopedic Surgery

## 2016-09-29 DIAGNOSIS — M48061 Spinal stenosis, lumbar region without neurogenic claudication: Secondary | ICD-10-CM | POA: Diagnosis not present

## 2016-09-29 DIAGNOSIS — Z419 Encounter for procedure for purposes other than remedying health state, unspecified: Secondary | ICD-10-CM

## 2016-09-29 DIAGNOSIS — Z7951 Long term (current) use of inhaled steroids: Secondary | ICD-10-CM

## 2016-09-29 DIAGNOSIS — Z83511 Family history of glaucoma: Secondary | ICD-10-CM | POA: Diagnosis not present

## 2016-09-29 DIAGNOSIS — Z87891 Personal history of nicotine dependence: Secondary | ICD-10-CM | POA: Diagnosis not present

## 2016-09-29 DIAGNOSIS — Z96651 Presence of right artificial knee joint: Secondary | ICD-10-CM | POA: Diagnosis present

## 2016-09-29 DIAGNOSIS — I10 Essential (primary) hypertension: Secondary | ICD-10-CM | POA: Diagnosis present

## 2016-09-29 DIAGNOSIS — Z7982 Long term (current) use of aspirin: Secondary | ICD-10-CM | POA: Diagnosis not present

## 2016-09-29 DIAGNOSIS — J302 Other seasonal allergic rhinitis: Secondary | ICD-10-CM | POA: Diagnosis not present

## 2016-09-29 DIAGNOSIS — M4126 Other idiopathic scoliosis, lumbar region: Secondary | ICD-10-CM | POA: Diagnosis not present

## 2016-09-29 DIAGNOSIS — M5416 Radiculopathy, lumbar region: Secondary | ICD-10-CM | POA: Diagnosis not present

## 2016-09-29 DIAGNOSIS — M5410 Radiculopathy, site unspecified: Secondary | ICD-10-CM

## 2016-09-29 DIAGNOSIS — M419 Scoliosis, unspecified: Secondary | ICD-10-CM | POA: Diagnosis present

## 2016-09-29 DIAGNOSIS — M1990 Unspecified osteoarthritis, unspecified site: Secondary | ICD-10-CM | POA: Diagnosis not present

## 2016-09-29 DIAGNOSIS — K219 Gastro-esophageal reflux disease without esophagitis: Secondary | ICD-10-CM | POA: Diagnosis present

## 2016-09-29 DIAGNOSIS — M4326 Fusion of spine, lumbar region: Secondary | ICD-10-CM | POA: Diagnosis not present

## 2016-09-29 DIAGNOSIS — M79604 Pain in right leg: Secondary | ICD-10-CM | POA: Diagnosis not present

## 2016-09-29 SURGERY — POSTERIOR LUMBAR FUSION 2 LEVEL
Anesthesia: General | Laterality: Right

## 2016-09-29 MED ORDER — BUPIVACAINE LIPOSOME 1.3 % IJ SUSP
INTRAMUSCULAR | Status: DC | PRN
  Administered 2016-09-29: 20 mL

## 2016-09-29 MED ORDER — CEFAZOLIN SODIUM-DEXTROSE 2-4 GM/100ML-% IV SOLN
2.0000 g | Freq: Three times a day (TID) | INTRAVENOUS | Status: AC
  Administered 2016-09-29 – 2016-09-30 (×2): 2 g via INTRAVENOUS
  Filled 2016-09-29 (×2): qty 100

## 2016-09-29 MED ORDER — LIDOCAINE 2% (20 MG/ML) 5 ML SYRINGE
INTRAMUSCULAR | Status: DC | PRN
  Administered 2016-09-29: 80 mg via INTRAVENOUS

## 2016-09-29 MED ORDER — 0.9 % SODIUM CHLORIDE (POUR BTL) OPTIME
TOPICAL | Status: DC | PRN
  Administered 2016-09-29 (×4): 1000 mL

## 2016-09-29 MED ORDER — HEMOSTATIC AGENTS (NO CHARGE) OPTIME
TOPICAL | Status: DC | PRN
  Administered 2016-09-29: 1 via TOPICAL

## 2016-09-29 MED ORDER — SUGAMMADEX SODIUM 200 MG/2ML IV SOLN
INTRAVENOUS | Status: DC | PRN
  Administered 2016-09-29: 204.4 mg via INTRAVENOUS

## 2016-09-29 MED ORDER — OCUVITE-LUTEIN PO CAPS
2.0000 | ORAL_CAPSULE | Freq: Every day | ORAL | Status: DC
  Filled 2016-09-29: qty 2

## 2016-09-29 MED ORDER — ONDANSETRON HCL 4 MG/2ML IJ SOLN
INTRAMUSCULAR | Status: DC | PRN
  Administered 2016-09-29: 4 mg via INTRAVENOUS

## 2016-09-29 MED ORDER — CHOLECALCIFEROL 25 MCG (1000 UT) PO TABS: 1000.0000 [IU] | ORAL_TABLET | Freq: Every day | ORAL | Status: DC

## 2016-09-29 MED ORDER — CALCIUM CARBONATE 600 MG PO TABS
600.0000 mg | ORAL_TABLET | Freq: Every day | ORAL | Status: DC
Start: 2016-09-29 — End: 2016-09-29

## 2016-09-29 MED ORDER — SODIUM CHLORIDE 0.9 % IV SOLN
INTRAVENOUS | Status: AC | PRN
  Administered 2016-09-29: .02 mg via INTRAVENOUS

## 2016-09-29 MED ORDER — FLUTICASONE PROPIONATE 50 MCG/ACT NA SUSP
1.0000 | NASAL | Status: DC | PRN
  Administered 2016-09-30: 1 via NASAL
  Filled 2016-09-29 (×2): qty 16

## 2016-09-29 MED ORDER — POVIDONE-IODINE 7.5 % EX SOLN: Freq: Once | CUTANEOUS | Status: DC

## 2016-09-29 MED ORDER — ZOLPIDEM TARTRATE 5 MG PO TABS: 5.0000 mg | ORAL_TABLET | Freq: Every evening | ORAL | Status: DC | PRN

## 2016-09-29 MED ORDER — OXYCODONE HCL 5 MG PO TABS: 5.0000 mg | ORAL_TABLET | Freq: Once | ORAL | Status: DC | PRN

## 2016-09-29 MED ORDER — SODIUM CHLORIDE 0.9% FLUSH
3.0000 mL | Freq: Two times a day (BID) | INTRAVENOUS | Status: DC
  Administered 2016-09-30: 3 mL via INTRAVENOUS

## 2016-09-29 MED ORDER — PROPOFOL 500 MG/50ML IV EMUL
INTRAVENOUS | Status: DC | PRN
  Administered 2016-09-29: 100 ug/kg/min via INTRAVENOUS

## 2016-09-29 MED ORDER — SUCCINYLCHOLINE CHLORIDE 200 MG/10ML IV SOSY
PREFILLED_SYRINGE | INTRAVENOUS | Status: DC | PRN
  Administered 2016-09-29: 120 mg via INTRAVENOUS

## 2016-09-29 MED ORDER — POTASSIUM CHLORIDE IN NACL 20-0.9 MEQ/L-% IV SOLN: INTRAVENOUS | Status: DC

## 2016-09-29 MED ORDER — VITAMIN D 1000 UNITS PO TABS
1000.0000 [IU] | ORAL_TABLET | Freq: Every day | ORAL | Status: DC
  Administered 2016-09-30: 1000 [IU] via ORAL
  Filled 2016-09-29: qty 1

## 2016-09-29 MED ORDER — MIDAZOLAM HCL 5 MG/5ML IJ SOLN
INTRAMUSCULAR | Status: DC | PRN
  Administered 2016-09-29: 2 mg via INTRAVENOUS

## 2016-09-29 MED ORDER — FLEET ENEMA 7-19 GM/118ML RE ENEM: 1.0000 | ENEMA | Freq: Once | RECTAL | Status: DC | PRN

## 2016-09-29 MED ORDER — METHYLENE BLUE 0.5 % INJ SOLN
INTRAVENOUS | Status: AC
  Filled 2016-09-29: qty 10

## 2016-09-29 MED ORDER — PROPOFOL 10 MG/ML IV BOLUS
INTRAVENOUS | Status: AC
  Filled 2016-09-29: qty 20

## 2016-09-29 MED ORDER — ACETAMINOPHEN 650 MG RE SUPP: 650.0000 mg | RECTAL | Status: DC | PRN

## 2016-09-29 MED ORDER — HYDROMORPHONE HCL 1 MG/ML IJ SOLN
INTRAMUSCULAR | Status: AC
  Filled 2016-09-29: qty 1

## 2016-09-29 MED ORDER — FENTANYL CITRATE (PF) 250 MCG/5ML IJ SOLN
INTRAMUSCULAR | Status: AC
  Filled 2016-09-29: qty 5

## 2016-09-29 MED ORDER — SODIUM CHLORIDE 0.9 % IV SOLN: 250.0000 mL | INTRAVENOUS | Status: DC

## 2016-09-29 MED ORDER — PHENYLEPHRINE 40 MCG/ML (10ML) SYRINGE FOR IV PUSH (FOR BLOOD PRESSURE SUPPORT)
PREFILLED_SYRINGE | INTRAVENOUS | Status: AC
  Filled 2016-09-29: qty 10

## 2016-09-29 MED ORDER — KETOROLAC TROMETHAMINE 30 MG/ML IJ SOLN
INTRAMUSCULAR | Status: AC
  Filled 2016-09-29: qty 1

## 2016-09-29 MED ORDER — FENTANYL CITRATE (PF) 100 MCG/2ML IJ SOLN: 50.0000 ug | Freq: Once | INTRAMUSCULAR | Status: DC

## 2016-09-29 MED ORDER — ALUM & MAG HYDROXIDE-SIMETH 200-200-20 MG/5ML PO SUSP: 30.0000 mL | Freq: Four times a day (QID) | ORAL | Status: DC | PRN

## 2016-09-29 MED ORDER — FENTANYL CITRATE (PF) 100 MCG/2ML IJ SOLN
INTRAMUSCULAR | Status: AC
  Filled 2016-09-29: qty 2

## 2016-09-29 MED ORDER — THROMBIN 20000 UNITS EX SOLR
CUTANEOUS | Status: AC
  Filled 2016-09-29: qty 20000

## 2016-09-29 MED ORDER — BUPIVACAINE-EPINEPHRINE (PF) 0.25% -1:200000 IJ SOLN
INTRAMUSCULAR | Status: AC
  Filled 2016-09-29: qty 30

## 2016-09-29 MED ORDER — MIDAZOLAM HCL 2 MG/2ML IJ SOLN
INTRAMUSCULAR | Status: AC
  Filled 2016-09-29: qty 2

## 2016-09-29 MED ORDER — DOCUSATE SODIUM 100 MG PO CAPS
100.0000 mg | ORAL_CAPSULE | Freq: Two times a day (BID) | ORAL | Status: DC
  Administered 2016-09-29 – 2016-09-30 (×2): 100 mg via ORAL
  Filled 2016-09-29 (×2): qty 1

## 2016-09-29 MED ORDER — ICAPS AREDS 2 PO CAPS: ORAL_CAPSULE | Freq: Every day | ORAL | Status: DC

## 2016-09-29 MED ORDER — LOSARTAN POTASSIUM-HCTZ 100-12.5 MG PO TABS: 1.0000 | ORAL_TABLET | Freq: Every day | ORAL | Status: DC

## 2016-09-29 MED ORDER — EPHEDRINE SULFATE 50 MG/ML IJ SOLN: INTRAMUSCULAR | Status: DC | PRN

## 2016-09-29 MED ORDER — LACTATED RINGERS IV SOLN
INTRAVENOUS | Status: DC
  Administered 2016-09-29 (×3): via INTRAVENOUS

## 2016-09-29 MED ORDER — DSS 100 MG PO CAPS: 100.0000 mg | ORAL_CAPSULE | Freq: Two times a day (BID) | ORAL | Status: DC

## 2016-09-29 MED ORDER — CEFAZOLIN SODIUM-DEXTROSE 2-4 GM/100ML-% IV SOLN
2.0000 g | INTRAVENOUS | Status: AC
  Administered 2016-09-29 (×2): 2 g via INTRAVENOUS

## 2016-09-29 MED ORDER — DIAZEPAM 5 MG PO TABS
5.0000 mg | ORAL_TABLET | Freq: Four times a day (QID) | ORAL | Status: DC | PRN
  Administered 2016-09-29 – 2016-09-30 (×4): 5 mg via ORAL
  Filled 2016-09-29 (×4): qty 1

## 2016-09-29 MED ORDER — ONDANSETRON HCL 4 MG PO TABS: 4.0000 mg | ORAL_TABLET | Freq: Four times a day (QID) | ORAL | Status: DC | PRN

## 2016-09-29 MED ORDER — ONDANSETRON HCL 4 MG/2ML IJ SOLN: 4.0000 mg | Freq: Four times a day (QID) | INTRAMUSCULAR | Status: DC | PRN

## 2016-09-29 MED ORDER — SENNOSIDES-DOCUSATE SODIUM 8.6-50 MG PO TABS: 1.0000 | ORAL_TABLET | Freq: Every evening | ORAL | Status: DC | PRN

## 2016-09-29 MED ORDER — HYDROMORPHONE HCL 1 MG/ML IJ SOLN: 0.2500 mg | INTRAMUSCULAR | Status: DC | PRN

## 2016-09-29 MED ORDER — THROMBIN 20000 UNITS EX KIT
PACK | CUTANEOUS | Status: DC | PRN
  Administered 2016-09-29: 20000 [IU] via TOPICAL

## 2016-09-29 MED ORDER — KETOROLAC TROMETHAMINE 30 MG/ML IJ SOLN
30.0000 mg | Freq: Once | INTRAMUSCULAR | Status: DC | PRN
  Administered 2016-09-29: 30 mg via INTRAVENOUS

## 2016-09-29 MED ORDER — OXYCODONE-ACETAMINOPHEN 5-325 MG PO TABS
1.0000 | ORAL_TABLET | ORAL | Status: DC | PRN
  Administered 2016-09-29 – 2016-09-30 (×6): 2 via ORAL
  Filled 2016-09-29 (×5): qty 2

## 2016-09-29 MED ORDER — CEFAZOLIN SODIUM-DEXTROSE 2-4 GM/100ML-% IV SOLN
INTRAVENOUS | Status: AC
  Filled 2016-09-29: qty 100

## 2016-09-29 MED ORDER — ROCURONIUM BROMIDE 10 MG/ML (PF) SYRINGE
PREFILLED_SYRINGE | INTRAVENOUS | Status: DC | PRN
  Administered 2016-09-29: 20 mg via INTRAVENOUS
  Administered 2016-09-29: 40 mg via INTRAVENOUS
  Administered 2016-09-29: 20 mg via INTRAVENOUS

## 2016-09-29 MED ORDER — EPHEDRINE SULFATE-NACL 50-0.9 MG/10ML-% IV SOSY
PREFILLED_SYRINGE | INTRAVENOUS | Status: DC | PRN
  Administered 2016-09-29: 5 mg via INTRAVENOUS

## 2016-09-29 MED ORDER — PROPOFOL 500 MG/50ML IV EMUL: INTRAVENOUS | Status: DC | PRN

## 2016-09-29 MED ORDER — LIDOCAINE 2% (20 MG/ML) 5 ML SYRINGE
INTRAMUSCULAR | Status: AC
  Filled 2016-09-29: qty 5

## 2016-09-29 MED ORDER — PHENYLEPHRINE 40 MCG/ML (10ML) SYRINGE FOR IV PUSH (FOR BLOOD PRESSURE SUPPORT)
PREFILLED_SYRINGE | INTRAVENOUS | Status: DC | PRN
  Administered 2016-09-29: 80 ug via INTRAVENOUS
  Administered 2016-09-29 (×2): 40 ug via INTRAVENOUS
  Administered 2016-09-29 (×3): 80 ug via INTRAVENOUS

## 2016-09-29 MED ORDER — HYDROMORPHONE HCL 1 MG/ML IJ SOLN
0.2500 mg | INTRAMUSCULAR | Status: DC | PRN
  Administered 2016-09-29 (×2): 0.5 mg via INTRAVENOUS

## 2016-09-29 MED ORDER — OXYCODONE HCL 5 MG/5ML PO SOLN: 5.0000 mg | Freq: Once | ORAL | Status: DC | PRN

## 2016-09-29 MED ORDER — ACETAMINOPHEN 325 MG PO TABS: 650.0000 mg | ORAL_TABLET | ORAL | Status: DC | PRN

## 2016-09-29 MED ORDER — BUPIVACAINE-EPINEPHRINE 0.25% -1:200000 IJ SOLN
INTRAMUSCULAR | Status: DC | PRN
  Administered 2016-09-29: 20 mL
  Administered 2016-09-29: 8 mL

## 2016-09-29 MED ORDER — SUCCINYLCHOLINE CHLORIDE 200 MG/10ML IV SOSY
PREFILLED_SYRINGE | INTRAVENOUS | Status: AC
  Filled 2016-09-29: qty 10

## 2016-09-29 MED ORDER — BISACODYL 5 MG PO TBEC: 5.0000 mg | DELAYED_RELEASE_TABLET | Freq: Every day | ORAL | Status: DC | PRN

## 2016-09-29 MED ORDER — ROCURONIUM BROMIDE 10 MG/ML (PF) SYRINGE
PREFILLED_SYRINGE | INTRAVENOUS | Status: AC
  Filled 2016-09-29: qty 5

## 2016-09-29 MED ORDER — CALCIUM CARBONATE 1250 (500 CA) MG PO TABS
1.0000 | ORAL_TABLET | Freq: Every day | ORAL | Status: DC
  Administered 2016-09-30: 500 mg via ORAL
  Filled 2016-09-29: qty 1

## 2016-09-29 MED ORDER — PROPOFOL 10 MG/ML IV BOLUS
INTRAVENOUS | Status: DC | PRN
  Administered 2016-09-29: 200 mg via INTRAVENOUS

## 2016-09-29 MED ORDER — HYDROCHLOROTHIAZIDE 12.5 MG PO CAPS
12.5000 mg | ORAL_CAPSULE | Freq: Every day | ORAL | Status: DC
  Administered 2016-09-30: 12.5 mg via ORAL
  Filled 2016-09-29: qty 1

## 2016-09-29 MED ORDER — PANTOPRAZOLE SODIUM 40 MG IV SOLR: 40.0000 mg | Freq: Every day | INTRAVENOUS | Status: DC

## 2016-09-29 MED ORDER — LOSARTAN POTASSIUM 50 MG PO TABS
100.0000 mg | ORAL_TABLET | Freq: Every day | ORAL | Status: DC
  Administered 2016-09-30: 100 mg via ORAL
  Filled 2016-09-29: qty 2

## 2016-09-29 MED ORDER — CEFAZOLIN SODIUM-DEXTROSE 1-4 GM/50ML-% IV SOLN
INTRAVENOUS | Status: AC
  Filled 2016-09-29: qty 100

## 2016-09-29 MED ORDER — OXYCODONE-ACETAMINOPHEN 5-325 MG PO TABS
ORAL_TABLET | ORAL | Status: AC
  Filled 2016-09-29: qty 2

## 2016-09-29 MED ORDER — PROMETHAZINE HCL 25 MG/ML IJ SOLN: 6.2500 mg | INTRAMUSCULAR | Status: DC | PRN

## 2016-09-29 MED ORDER — MENTHOL 3 MG MT LOZG: 1.0000 | LOZENGE | OROMUCOSAL | Status: DC | PRN

## 2016-09-29 MED ORDER — VITAMIN D 1000 UNITS PO TABS: 1000.0000 [IU] | ORAL_TABLET | Freq: Every day | ORAL | Status: DC

## 2016-09-29 MED ORDER — GABAPENTIN 300 MG PO CAPS
300.0000 mg | ORAL_CAPSULE | Freq: Three times a day (TID) | ORAL | Status: DC
  Administered 2016-09-29 – 2016-09-30 (×3): 300 mg via ORAL
  Filled 2016-09-29 (×3): qty 1

## 2016-09-29 MED ORDER — PHENOL 1.4 % MT LIQD: 1.0000 | OROMUCOSAL | Status: DC | PRN

## 2016-09-29 MED ORDER — SODIUM CHLORIDE 0.9% FLUSH: 3.0000 mL | INTRAVENOUS | Status: DC | PRN

## 2016-09-29 MED ORDER — MEPERIDINE HCL 25 MG/ML IJ SOLN: 6.2500 mg | INTRAMUSCULAR | Status: DC | PRN

## 2016-09-29 MED ORDER — FENTANYL CITRATE (PF) 250 MCG/5ML IJ SOLN
INTRAMUSCULAR | Status: DC | PRN
  Administered 2016-09-29 (×3): 50 ug via INTRAVENOUS
  Administered 2016-09-29: 100 ug via INTRAVENOUS
  Administered 2016-09-29: 50 ug via INTRAVENOUS
  Administered 2016-09-29 (×2): 25 ug via INTRAVENOUS
  Administered 2016-09-29: 50 ug via INTRAVENOUS

## 2016-09-29 MED ORDER — MORPHINE SULFATE (PF) 4 MG/ML IV SOLN
1.0000 mg | INTRAVENOUS | Status: DC | PRN
  Administered 2016-09-30 (×2): 2 mg via INTRAVENOUS
  Filled 2016-09-29 (×2): qty 1

## 2016-09-29 MED ORDER — FERROUS SULFATE 325 (65 FE) MG PO TABS
325.0000 mg | ORAL_TABLET | Freq: Three times a day (TID) | ORAL | Status: DC
  Administered 2016-09-30 (×2): 325 mg via ORAL
  Filled 2016-09-29 (×2): qty 1

## 2016-09-29 MED ORDER — PHENYLEPHRINE HCL 10 MG/ML IJ SOLN
INTRAMUSCULAR | Status: DC | PRN
  Administered 2016-09-29: 25 ug/min via INTRAVENOUS

## 2016-09-29 MED ORDER — EPHEDRINE 5 MG/ML INJ
INTRAVENOUS | Status: AC
  Filled 2016-09-29: qty 10

## 2016-09-29 SURGICAL SUPPLY — 96 items
BENZOIN TINCTURE PRP APPL 2/3 (GAUZE/BANDAGES/DRESSINGS) ×3 IMPLANT
BIT DRILL 3.2 (BIT) ×3
BIT DRILL 65X3.2XQC STP NS (BIT) ×1 IMPLANT
BIT DRL 65X3.2XQC STP NS (BIT) ×1
BLADE CLIPPER SURG (BLADE) IMPLANT
BONE VIVIGEN FORMABLE 10CC (Bone Implant) ×3 IMPLANT
BUR PRESCISION 1.7 ELITE (BURR) ×3 IMPLANT
BUR ROUND PRECISION 4.0 (BURR) ×2 IMPLANT
BUR ROUND PRECISION 4.0MM (BURR) ×1
BUR SABER RD CUTTING 3.0 (BURR) IMPLANT
BUR SABER RD CUTTING 3.0MM (BURR)
CAGE BULLET CONCORDE 9X10X27 (Cage) ×2 IMPLANT
CAGE BULLET CONCORDE 9X10X27MM (Cage) ×1 IMPLANT
CAGE CONCORDE BULLET 9X11X27 (Cage) ×1 IMPLANT
CAGE CONCORDE BULLET 9X11X27MM (Cage) ×1 IMPLANT
CAGE SPNL 5D BLT NOSE 27X9X11 (Cage) ×1 IMPLANT
CARTRIDGE OIL MAESTRO DRILL (MISCELLANEOUS) ×2 IMPLANT
CLOSURE WOUND 1/2 X4 (GAUZE/BANDAGES/DRESSINGS) ×1
CONT SPEC 4OZ CLIKSEAL STRL BL (MISCELLANEOUS) ×3 IMPLANT
COVER MAYO STAND STRL (DRAPES) ×9 IMPLANT
COVER SURGICAL LIGHT HANDLE (MISCELLANEOUS) ×3 IMPLANT
DIFFUSER DRILL AIR PNEUMATIC (MISCELLANEOUS) ×6 IMPLANT
DRAIN CHANNEL 15F RND FF W/TCR (WOUND CARE) ×6 IMPLANT
DRAPE C-ARM 42X72 X-RAY (DRAPES) ×3 IMPLANT
DRAPE POUCH INSTRU U-SHP 10X18 (DRAPES) ×3 IMPLANT
DRAPE SURG 17X23 STRL (DRAPES) ×12 IMPLANT
DRSG MEPILEX BORDER 4X12 (GAUZE/BANDAGES/DRESSINGS) IMPLANT
DRSG MEPILEX BORDER 4X8 (GAUZE/BANDAGES/DRESSINGS) IMPLANT
DURAPREP 26ML APPLICATOR (WOUND CARE) ×3 IMPLANT
ELECT BLADE 4.0 EZ CLEAN MEGAD (MISCELLANEOUS) ×3
ELECT CAUTERY BLADE 6.4 (BLADE) ×6 IMPLANT
ELECT REM PT RETURN 9FT ADLT (ELECTROSURGICAL) ×3
ELECTRODE BLDE 4.0 EZ CLN MEGD (MISCELLANEOUS) ×1 IMPLANT
ELECTRODE REM PT RTRN 9FT ADLT (ELECTROSURGICAL) ×1 IMPLANT
EVACUATOR SILICONE 100CC (DRAIN) ×6 IMPLANT
FEE INTRAOP MONITOR IMPULS NCS (MISCELLANEOUS) ×1 IMPLANT
GAUZE SPONGE 4X4 12PLY STRL (GAUZE/BANDAGES/DRESSINGS) ×3 IMPLANT
GAUZE SPONGE 4X4 16PLY XRAY LF (GAUZE/BANDAGES/DRESSINGS) ×6 IMPLANT
GLOVE BIO SURGEON STRL SZ 6.5 (GLOVE) ×2 IMPLANT
GLOVE BIO SURGEON STRL SZ7 (GLOVE) ×3 IMPLANT
GLOVE BIO SURGEON STRL SZ8 (GLOVE) ×3 IMPLANT
GLOVE BIO SURGEONS STRL SZ 6.5 (GLOVE) ×1
GLOVE BIOGEL PI IND STRL 6.5 (GLOVE) ×3 IMPLANT
GLOVE BIOGEL PI IND STRL 7.0 (GLOVE) ×5 IMPLANT
GLOVE BIOGEL PI IND STRL 8 (GLOVE) ×1 IMPLANT
GLOVE BIOGEL PI INDICATOR 6.5 (GLOVE) ×6
GLOVE BIOGEL PI INDICATOR 7.0 (GLOVE) ×10
GLOVE BIOGEL PI INDICATOR 8 (GLOVE) ×2
GLOVE SURG SS PI 7.0 STRL IVOR (GLOVE) ×6 IMPLANT
GOWN STRL REUS W/ TWL LRG LVL3 (GOWN DISPOSABLE) ×6 IMPLANT
GOWN STRL REUS W/ TWL XL LVL3 (GOWN DISPOSABLE) ×1 IMPLANT
GOWN STRL REUS W/TWL LRG LVL3 (GOWN DISPOSABLE) ×12
GOWN STRL REUS W/TWL XL LVL3 (GOWN DISPOSABLE) ×2
INTRAOP MONITOR FEE IMPULS NCS (MISCELLANEOUS) ×1
INTRAOP MONITOR FEE IMPULSE (MISCELLANEOUS) ×2
IV CATH 14GX2 1/4 (CATHETERS) ×3 IMPLANT
KIT BASIN OR (CUSTOM PROCEDURE TRAY) ×3 IMPLANT
KIT POSITION SURG JACKSON T1 (MISCELLANEOUS) ×3 IMPLANT
KIT ROOM TURNOVER OR (KITS) ×3 IMPLANT
MARKER SKIN DUAL TIP RULER LAB (MISCELLANEOUS) ×3 IMPLANT
NEEDLE HYPO 25GX1X1/2 BEV (NEEDLE) ×3 IMPLANT
NEEDLE SPNL 18GX3.5 QUINCKE PK (NEEDLE) ×6 IMPLANT
NS IRRIG 1000ML POUR BTL (IV SOLUTION) ×3 IMPLANT
OIL CARTRIDGE MAESTRO DRILL (MISCELLANEOUS) ×6
PACK LAMINECTOMY ORTHO (CUSTOM PROCEDURE TRAY) ×3 IMPLANT
PACK UNIVERSAL I (CUSTOM PROCEDURE TRAY) ×3 IMPLANT
PAD ARMBOARD 7.5X6 YLW CONV (MISCELLANEOUS) ×6 IMPLANT
PATTIES SURGICAL .5 X1 (DISPOSABLE) ×3 IMPLANT
PATTIES SURGICAL .5 X3 (DISPOSABLE) IMPLANT
PATTIES SURGICAL .5X1.5 (GAUZE/BANDAGES/DRESSINGS) ×3 IMPLANT
PATTIES SURGICAL .75X.75 (GAUZE/BANDAGES/DRESSINGS) ×3 IMPLANT
PROBE PEDCLE PROBE MAGSTM DISP (MISCELLANEOUS) ×3 IMPLANT
ROD EXPEDIUM PER BENT 65MM (Rod) ×6 IMPLANT
SCREW SET SINGLE INNER (Screw) ×18 IMPLANT
SCREW VIPER CORT FIX 6.00X30 (Screw) ×3 IMPLANT
SCREW VIPER CORT FIX 6X35 (Screw) ×9 IMPLANT
SCREW VIPER CORTICAL FIX 6X40 (Screw) ×6 IMPLANT
SPONGE INTESTINAL PEANUT (DISPOSABLE) IMPLANT
SPONGE SURGIFOAM ABS GEL 100 (HEMOSTASIS) IMPLANT
STRIP CLOSURE SKIN 1/2X4 (GAUZE/BANDAGES/DRESSINGS) ×2 IMPLANT
SURGIFLO W/THROMBIN 8M KIT (HEMOSTASIS) IMPLANT
SUT MNCRL AB 4-0 PS2 18 (SUTURE) ×3 IMPLANT
SUT VIC AB 0 CT1 18XCR BRD 8 (SUTURE) ×2 IMPLANT
SUT VIC AB 0 CT1 8-18 (SUTURE) ×6
SUT VIC AB 1 CT1 18XCR BRD 8 (SUTURE) ×2 IMPLANT
SUT VIC AB 1 CT1 8-18 (SUTURE) ×4
SUT VIC AB 2-0 CT2 18 VCP726D (SUTURE) ×6 IMPLANT
SYR 20CC LL (SYRINGE) ×3 IMPLANT
SYR BULB IRRIGATION 50ML (SYRINGE) ×3 IMPLANT
SYR CONTROL 10ML LL (SYRINGE) ×6 IMPLANT
SYR TB 1ML LUER SLIP (SYRINGE) ×3 IMPLANT
TOWEL OR 17X24 6PK STRL BLUE (TOWEL DISPOSABLE) ×3 IMPLANT
TOWEL OR 17X26 10 PK STRL BLUE (TOWEL DISPOSABLE) ×3 IMPLANT
TRAY FOLEY W/METER SILVER 16FR (SET/KITS/TRAYS/PACK) ×3 IMPLANT
WATER STERILE IRR 1000ML POUR (IV SOLUTION) ×3 IMPLANT
YANKAUER SUCT BULB TIP NO VENT (SUCTIONS) ×3 IMPLANT

## 2016-09-29 NOTE — Anesthesia Preprocedure Evaluation (Addendum)
Anesthesia Evaluation  Patient identified by MRN, date of birth, ID band Patient awake    Reviewed: Allergy & Precautions, H&P , NPO status , Patient's Chart, lab work & pertinent test results  History of Anesthesia Complications Negative for: history of anesthetic complications  Airway Mallampati: II  TM Distance: >3 FB Neck ROM: full    Dental no notable dental hx. (+) Teeth Intact, Dental Advisory Given, Missing   Pulmonary former smoker,    Pulmonary exam normal breath sounds clear to auscultation       Cardiovascular Exercise Tolerance: Good hypertension, Pt. on medications negative cardio ROS Normal cardiovascular exam Rhythm:regular Rate:Normal     Neuro/Psych History back surgery negative neurological ROS  negative psych ROS   GI/Hepatic Neg liver ROS, GERD  Controlled and Medicated,  Endo/Other  negative endocrine ROS  Renal/GU negative Renal ROS  negative genitourinary   Musculoskeletal  (+) Arthritis ,   Abdominal Normal abdominal exam  (+)   Peds  Hematology   Anesthesia Other Findings   Reproductive/Obstetrics negative OB ROS                           Anesthesia Physical  Anesthesia Plan  ASA: II  Anesthesia Plan: General   Post-op Pain Management:    Induction:   PONV Risk Score and Plan: 3 and Ondansetron, Dexamethasone, Midazolam and Propofol infusion  Airway Management Planned: Oral ETT  Additional Equipment:   Intra-op Plan:   Post-operative Plan: Extubation in OR  Informed Consent: I have reviewed the patients History and Physical, chart, labs and discussed the procedure including the risks, benefits and alternatives for the proposed anesthesia with the patient or authorized representative who has indicated his/her understanding and acceptance.   Dental Advisory Given  Plan Discussed with: CRNA and Surgeon  Anesthesia Plan Comments:                                    Anesthesia Evaluation  Patient identified by MRN, date of birth, ID band Patient awake    Reviewed: Allergy & Precautions, H&P , NPO status , Patient's Chart, lab work & pertinent test results  Airway Mallampati: II TM Distance: >3 FB Neck ROM: Full    Dental No notable dental hx.    Pulmonary neg pulmonary ROS, former smoker clear to auscultation  Pulmonary exam normal       Cardiovascular hypertension, Pt. on medications neg cardio ROS Regular Normal    Neuro/Psych  Headaches, Negative Neurological ROS  Negative Psych ROS   GI/Hepatic negative GI ROS, Neg liver ROS,   Endo/Other  Negative Endocrine ROS  Renal/GU negative Renal ROS  Genitourinary negative   Musculoskeletal negative musculoskeletal ROS (+)   Abdominal (+) obese,   Peds negative pediatric ROS (+)  Hematology negative hematology ROS (+)   Anesthesia Other Findings   Reproductive/Obstetrics negative OB ROS                           Anesthesia Physical Anesthesia Plan  ASA: II  Anesthesia Plan: General   Post-op Pain Management:    Induction: Intravenous  Airway Management Planned: Oral ETT  Additional Equipment:   Intra-op Plan:   Post-operative Plan: Extubation in OR  Informed Consent: I have reviewed the patients History and Physical, chart, labs and discussed the procedure including the risks, benefits  and alternatives for the proposed anesthesia with the patient or authorized representative who has indicated his/her understanding and acceptance.   Dental advisory given  Plan Discussed with: CRNA  Anesthesia Plan Comments:       Anesthesia Quick Evaluation  Anesthesia Quick Evaluation

## 2016-09-29 NOTE — Transfer of Care (Signed)
Immediate Anesthesia Transfer of Care Note  Patient: Manuel Hood  Procedure(s) Performed: Procedure(s) with comments: RIGHT SIDED LUMBAR 3-4, LUMBAR 4-5 TRANSFORAMINAL LUMBAR INTERBODY FUSION WITH INSTRUMENTATION AND ALLOGRAFT (Right) - RIGHT SIDED LUMBAR 3-4, LUMBAR 4-5 TRANSFORAMINAL LUMBAR INTERBODY FUSION WITH INSTRUMENTATION AND ALLOGRAFT; REQUEST 5.5 HOURS AND FLIP  Patient Location: PACU  Anesthesia Type:General  Level of Consciousness: awake and alert   Airway & Oxygen Therapy: Patient Spontanous Breathing and Patient connected to nasal cannula oxygen  Post-op Assessment: Report given to RN and Post -op Vital signs reviewed and stable  Post vital signs: Reviewed and stable  Last Vitals:  Vitals:   09/29/16 0803 09/29/16 1827  BP: 138/87 135/83  Pulse: 94 (!) 102  Resp: 20 13  Temp: 36.7 C (!) 36.4 C  SpO2: 96% 97%    Last Pain:  Vitals:   09/29/16 0821  TempSrc:   PainSc: 9          Complications: No apparent anesthesia complications

## 2016-09-29 NOTE — Anesthesia Postprocedure Evaluation (Signed)
Anesthesia Post Note  Patient: DEJAN ANGERT  Procedure(s) Performed: Procedure(s) (LRB): RIGHT SIDED LUMBAR 3-4, LUMBAR 4-5 TRANSFORAMINAL LUMBAR INTERBODY FUSION WITH INSTRUMENTATION AND ALLOGRAFT (Right)     Patient location during evaluation: PACU Anesthesia Type: General Level of consciousness: awake and alert Pain management: pain level controlled Vital Signs Assessment: post-procedure vital signs reviewed and stable Respiratory status: spontaneous breathing, nonlabored ventilation, respiratory function stable and patient connected to nasal cannula oxygen Cardiovascular status: blood pressure returned to baseline and stable Postop Assessment: no signs of nausea or vomiting Anesthetic complications: no    Last Vitals:  Vitals:   09/29/16 1915 09/29/16 1930  BP: 120/84 137/75  Pulse: 95 99  Resp: 14 19  Temp:    SpO2: 96% 97%    Last Pain:  Vitals:   09/29/16 1920  TempSrc:   PainSc: 5                  Pansy Ostrovsky DAVID

## 2016-09-29 NOTE — H&P (Signed)
PREOPERATIVE H&P  Chief Complaint: Right leg pain  HPI: Manuel Hood is a 66 y.o. male who presents with ongoing pain in the right leg  MRI reveals stenosis L3-L5. Of note, the patient is s/p a previous lumbar decompression.  Patient has failed multiple forms of conservative care and continues to have pain (see office notes for additional details regarding the patient's full course of treatment)  Past Medical History:  Diagnosis Date  . Arthritis   . GERD (gastroesophageal reflux disease)   . Hypertension   . Seasonal allergies   . Sinus pain    Past Surgical History:  Procedure Laterality Date  . APPENDECTOMY    . BACK SURGERY     x2 (cervical and lumbar)  . JOINT REPLACEMENT     Right-full Left- partial  . KNEE ARTHROSCOPY  rt knee x3  . KNEE ARTHROSCOPY WITH MEDIAL MENISECTOMY Left 06/29/2012   Procedure: LEFT KNEE ARTHROSCOPY WITH DEBRIDEMENT AND MENISECTOMY;  Surgeon: Mauri Pole, MD;  Location: [facility];  Service: Orthopedics;  Laterality: Left;  . ROTATOR CUFF REPAIR  rt shoulder  . TONSILLECTOMY    . TOTAL KNEE ARTHROPLASTY  02/04/2011   Procedure: TOTAL KNEE ARTHROPLASTY;  Surgeon: Johnn Hai;  Location: [facility];  Service: Orthopedics;  Laterality: Right;  . TOTAL KNEE REVISION Right 06/29/2012   Procedure: REVISION RIGHT TOTAL KNEE/REVISION FEMORAL COMPONENT POLY EXCHANGE/PATELLA LATERAL FACETECTOMY;  Surgeon: Mauri Pole, MD;  Location: [facility];  Service: Orthopedics;  Laterality: Right;   Social History   Social History  . Marital status: Married    Spouse name: N/A  . Number of children: N/A  . Years of education: N/A   Social History Main Topics  . Smoking status: Former Smoker    Years: 20.00    Types: Cigarettes    Quit date: 02/02/1996  . Smokeless tobacco: Never Used  . Alcohol use 0.0 oz/week     Comment: 1 beer a day  . Drug use: No  . Sexual activity: Not on file   Other Topics Concern  . Not on file   Social History Narrative    . No narrative on file   Family History  Problem Relation Age of Onset  . High blood pressure Mother   . Glaucoma Mother    No Known Allergies Prior to Admission medications   Medication Sig Start Date End Date Taking? Authorizing Provider  aspirin EC 81 MG tablet Take 81 mg by mouth daily.   Yes [provider]  calcium carbonate (OS-CAL) 600 MG TABS Take 600 mg by mouth daily.     Yes [provider]  Cholecalciferol (D3-1000) 1000 units tablet Take 1,000 Units by mouth daily.   Yes [provider]  cholecalciferol (VITAMIN D) 1000 UNITS tablet Take 1,000 Units by mouth daily.     Yes [provider]  fluticasone (FLONASE) 50 MCG/ACT nasal spray Place 1 spray into both nostrils as needed for allergies or rhinitis.   Yes [provider]  gabapentin (NEURONTIN) 300 MG capsule Take 300 mg by mouth 3 (three) times daily.   Yes [provider]  HYDROcodone-acetaminophen (NORCO/VICODIN) 5-325 MG tablet Take 1 tablet by mouth at bedtime as needed for moderate pain (doesn't take with tramadol).   Yes [provider]  ibuprofen (ADVIL,MOTRIN) 200 MG tablet Take 800 mg by mouth 3 (three) times daily.   Yes [provider]  losartan-hydrochlorothiazide (HYZAAR) 100-12.5 MG tablet Take 1 tablet  by mouth daily.   Yes [provider]  Multiple Vitamins-Minerals (ICAPS AREDS 2 PO) Take 1 tablet by mouth daily.   Yes [provider]  Multiple Vitamins-Minerals (ONE-A-DAY 50 PLUS PO) Take 1 tablet by mouth daily.     Yes [provider]  traMADol (ULTRAM) 50 MG tablet Take 50 mg by mouth 3 (three) times daily as needed for moderate pain.   Yes [provider]  aspirin EC 325 MG EC tablet Take 1 tablet (325 mg total) by mouth 2 (two) times daily. Patient not taking: Reported on 09/20/2016 06/30/12   Danae Orleans, PA-C  docusate sodium 100 MG CAPS Take 100 mg by mouth 2 (two) times daily. Patient  not taking: Reported on 09/20/2016 06/30/12   Danae Orleans, PA-C  ferrous sulfate 325 (65 FE) MG tablet Take 1 tablet (325 mg total) by mouth 3 (three) times daily after meals. Patient not taking: Reported on 09/20/2016 06/30/12   Danae Orleans, PA-C  methocarbamol (ROBAXIN) 500 MG tablet Take 1 tablet (500 mg total) by mouth every 6 (six) hours as needed (muscle spasms). Patient not taking: Reported on 09/20/2016 06/30/12   Danae Orleans, PA-C  oxyCODONE (OXY IR/ROXICODONE) 5 MG immediate release tablet Take 1-3 tablets (5-15 mg total) by mouth every 4 (four) hours as needed for pain. Patient not taking: Reported on 09/20/2016 06/30/12   Danae Orleans, PA-C  polyethylene glycol [facility] / Floria Raveling) packet Take 17 g by mouth 2 (two) times daily. Patient not taking: Reported on 09/20/2016 06/30/12   Danae Orleans, PA-C     All other systems have been reviewed and were otherwise negative with the exception of those mentioned in the HPI and as above.  Physical Exam: There were no vitals filed for this visit.  General: Alert, no acute distress Cardiovascular: No pedal edema Respiratory: No cyanosis, no use of accessory musculature Skin: No lesions in the area of chief complaint Neurologic: Sensation intact distally Psychiatric: Patient is competent for consent with normal mood and affect Lymphatic: No axillary or cervical lymphadenopathy  MUSCULOSKELETAL: + SLR on the right  Assessment/Plan: Right leg pain Plan for Procedure(s): RIGHT SIDED LUMBAR 3-4, LUMBAR 4-5 TRANSFORAMINAL LUMBAR INTERBODY FUSION WITH INSTRUMENTATION AND ALLOGRAFT   Sinclair Ship, MD 09/29/2016 7:23 AM

## 2016-09-29 NOTE — Anesthesia Procedure Notes (Signed)
Procedure Name: Intubation Date/Time: 09/29/2016 11:25 AM Performed by: Freddie Breech Pre-anesthesia Checklist: Patient identified, Emergency Drugs available, Suction available and Patient being monitored Patient Re-evaluated:Patient Re-evaluated prior to induction Oxygen Delivery Method: Circle System Utilized Preoxygenation: Pre-oxygenation with 100% oxygen Induction Type: IV induction Ventilation: Mask ventilation without difficulty Laryngoscope Size: Mac and 4 Grade View: Grade II Tube type: Oral Number of attempts: 1 Airway Equipment and Method: Stylet and Oral airway Placement Confirmation: ETT inserted through vocal cords under direct vision,  positive ETCO2 and breath sounds checked- equal and bilateral Secured at: 23 cm Tube secured with: Tape Dental Injury: Teeth and Oropharynx as per pre-operative assessment

## 2016-09-30 LAB — URINE CULTURE: Culture: NO GROWTH

## 2016-09-30 MED ORDER — PROSIGHT PO TABS
2.0000 | ORAL_TABLET | Freq: Every day | ORAL | Status: DC
  Administered 2016-09-30: 2 via ORAL
  Filled 2016-09-30: qty 2

## 2016-09-30 MED FILL — Sodium Chloride IV Soln 0.9%: INTRAVENOUS | Qty: 3000 | Status: AC

## 2016-09-30 MED FILL — Thrombin For Soln 20000 Unit: CUTANEOUS | Qty: 1 | Status: AC

## 2016-09-30 MED FILL — Heparin Sodium (Porcine) Inj 1000 Unit/ML: INTRAMUSCULAR | Qty: 30 | Status: AC

## 2016-09-30 NOTE — Evaluation (Signed)
Physical Therapy Evaluation & Discharge Patient Details Name: Manuel Hood MRN: [id number] DOB: [birth year] Today's Date: 09/30/2016   History of Present Illness  Pt is a 66 y.o. male s/p L3-5 TLIF. PMHx: Arthritis, GERD< HTN, Back sx x2, R TKA, R rotator cuff repair.  Clinical Impression  Pt presented sitting OOB in recliner chair, awake and willing to participate in therapy session. Prior to admission, pt reported that he was independent with all functional mobility and ADLs. Pt ambulated in hallway with supervision without use of an AD. Pt also successfully completed stair training this session with min guard. Pt will have 24/7 supervision/assist upon d/c home. PT reviewed 3/3 back precautions with pt throughout. No further acute PT needs identified at this time. PT signing off.     Follow Up Recommendations No PT follow up;Supervision/Assistance - 24 hour    Equipment Recommendations  None recommended by PT    Recommendations for Other Services       Precautions / Restrictions Precautions Precautions: Back Precaution Booklet Issued: No Precaution Comments: PT reviewed 3/3 back precautions with pt throughout Required Braces or Orthoses: Spinal Brace Spinal Brace: Thoracolumbosacral orthotic Restrictions Weight Bearing Restrictions: No      Mobility  Bed Mobility Overal bed mobility: Needs Assistance Bed Mobility: Rolling;Sidelying to Sit;Sit to Sidelying Rolling: Supervision Sidelying to sit: Supervision     Sit to sidelying: Supervision General bed mobility comments: pt OOB in recliner chair upon arrival  Transfers Overall transfer level: Needs assistance Equipment used: None Transfers: Sit to/from Stand Sit to Stand: Min guard         General transfer comment: for safety, increased time and effort  Ambulation/Gait Ambulation/Gait assistance: Supervision Ambulation Distance (Feet): 250 Feet Assistive device: None Gait Pattern/deviations: Step-through  pattern;Decreased step length - right;Decreased step length - left;Decreased stride length Gait velocity: decreased Gait velocity interpretation: Below normal speed for age/gender General Gait Details: slow, steady gait, no LOB or need for physical assistance, supervision for safety  Stairs Stairs: Yes Stairs assistance: Min guard Stair Management: Two rails;Step to pattern;Forwards Number of Stairs: 5 General stair comments: min guard for safety  Wheelchair Mobility    Modified Rankin (Stroke Patients Only)       Balance Overall balance assessment: Needs assistance Sitting-balance support: Feet supported;No upper extremity supported Sitting balance-Leahy Scale: Good     Standing balance support: No upper extremity supported;During functional activity Standing balance-Leahy Scale: Fair                               Pertinent Vitals/Pain Pain Assessment: 0-10 Pain Score: 8  Pain Location: back Pain Descriptors / Indicators: Discomfort;Sore Pain Intervention(s): Monitored during session;Repositioned    Home Living Family/patient expects to be discharged to:: Private residence Living Arrangements: Spouse/significant other Available Help at Discharge: Family;Available 24 hours/day Type of Home: House Home Access: Stairs to enter Entrance Stairs-Rails: Right;Left;Can reach both Entrance Stairs-Number of Steps: 5 Home Layout: One level Home Equipment: Walker - 2 wheels;Crutches      Prior Function Level of Independence: Independent               Hand Dominance        Extremity/Trunk Assessment   Upper Extremity Assessment Upper Extremity Assessment: Defer to OT evaluation    Lower Extremity Assessment Lower Extremity Assessment: Overall WFL for tasks assessed    Cervical / Trunk Assessment Cervical / Trunk Assessment: Other exceptions Cervical / Trunk Exceptions:  s/p spinal sx  Communication   Communication: No difficulties  Cognition  Arousal/Alertness: Awake/alert Behavior During Therapy: WFL for tasks assessed/performed Overall Cognitive Status: Within Functional Limits for tasks assessed                                        General Comments      Exercises     Assessment/Plan    PT Assessment Patent does not need any further PT services  PT Problem List         PT Treatment Interventions      PT Goals (Current goals can be found in the Care Plan section)  Acute Rehab PT Goals Patient Stated Goal: return home    Frequency     Barriers to discharge        Co-evaluation               AM-PAC PT "6 Clicks" Daily Activity  Outcome Measure Difficulty turning over in bed (including adjusting bedclothes, sheets and blankets)?: A Little Difficulty moving from lying on back to sitting on the side of the bed? : A Little Difficulty sitting down on and standing up from a chair with arms (e.g., wheelchair, bedside commode, etc,.)?: A Little Help needed moving to and from a bed to chair (including a wheelchair)?: A Little Help needed walking in hospital room?: A Little Help needed climbing 3-5 steps with a railing? : A Little 6 Click Score: 18    End of Session Equipment Utilized During Treatment: Gait belt;Back brace Activity Tolerance: Patient tolerated treatment well Patient left: in chair;with call bell/phone within reach Nurse Communication: Mobility status PT Visit Diagnosis: Pain Pain - part of body:  (back)    Time: 1610-9604 PT Time Calculation (min) (ACUTE ONLY): 16 min   Charges:   PT Evaluation $PT Eval Moderate Complexity: 1 Mod     PT G Codes:        Neg[place], PT, DPT [place] 09/30/2016, 11:11 AM

## 2016-09-30 NOTE — Op Note (Signed)
NAME:  Manuel, Hood                      ACCOUNT NO.:  MEDICAL RECORD NO.:  [id number]  PHYSICIAN:  Phylliss Bob, MD           DATE OF BIRTH:  DATE OF PROCEDURE:  09/29/2016                              OPERATIVE REPORT   PREOPERATIVE DIAGNOSES: 1. Right-sided lumbar radiculopathy. 2. Status post previous L4-5 decompression. 3. Lumbar scoliosis and kyphosis, L3-4, L4-5. 4. Spinal stenosis involving L3-4 and L4-5.  POSTOPERATIVE DIAGNOSES: 1. Right-sided lumbar radiculopathy. 2. Status post previous L4-5 decompression. 3. Lumbar scoliosis and kyphosis, L3-4, L4-5. 4. Spinal stenosis involving L3-4 and L4-5.  PROCEDURES: 1. Lumbar decompression, L3-4. 2. Revision lumbar decompression, L4-5. 3. Right-sided transforaminal lumbar interbody fusion, L3-4, L4-5. 4. Left-sided posterolateral fusion, L3-4, L4-5. 5. Insertion of interbody device x2 (Concorde bullet cages). 6. Placement of posterior segmental instrumentation, L3, L4, L5,     bilateral. 7. Use of morselized allograft - [employer]. 8. Use of local autograft. 9. Intraoperative use of fluoroscopy.  SURGEON:  Phylliss Bob, MD  ASSISTANT:  Pricilla Holm, PA-C.  ANESTHESIA:  General endotracheal anesthesia.  COMPLICATIONS:  None.  DISPOSITION:  Stable.  ESTIMATED BLOOD LOSS:  200 mL.  INDICATIONS FOR SURGERY:  Briefly, Manuel Hood is a 66 year old male who did present to me with ongoing rather debilitating pain in his right leg.  The patient did feel that his pain was rather severe and that he was unable to live with his pain.  He did have epidural injections, which unfortunately did not help address his pain.  Given the patient's pain and ongoing dysfunction despite appropriate conservative treatment measures, we did discuss proceeding with the procedure noted above.  The patient was fully aware of the risks and limitations of surgery, and did elect to proceed.  OPERATIVE DETAILS:  On September 29, 2016, the patient  was brought to Surgery and general endotracheal anesthesia was administered.  The patient was placed prone on a well-padded flat Jackson bed with a spinal frame.  Antibiotics were given, and a time-out procedure was performed after prepping and draping the back in the usual fashion.  A midline incision was then made in line with the patient's previous incision. The fascia was incised in the midline.  At this point, the L3, L4, and L5 pedicles were cannulated bilaterally using a cortical medial to lateral trajectory technique.  I was very pleased with the purchase of the taps.  On the patient's left side, I did decorticate the posterior elements and posterolateral gutter using a high-speed bur and then I placed 6-mm screws of the appropriate length into the L3, L4, and L5 pedicles on the left side.  A 65-mm rod was then placed.  Distraction was then applied across the L3-4 and L4-5 intervertebral spaces, and caps were placed and provisionally tightened.  On the right side, I did place bone wax in the cannulated pedicles.  I then proceeded with the decompression.  At the L4-5 level, I did remove the L4 spinous process and I did meticulously perform a bilateral lateral recess decompression. Of note, there was abundant scar tissue noted in the right lateral recess, given the patient's previous L4-5 decompression.  This did result in a very meticulous procedure, as I did need to ensure no undue injury to  the nerve or thecal sac, as there was a significant amount of granulation tissue and scar noted in the region of the right L5 nerve. I was, however, able to thoroughly decompress the nerve.  Then, with an assistant holding medial retraction of the right L5 nerve, I did perform a thorough and complete L4-5 intervertebral diskectomy.  The endplates were then prepared, and the intervertebral space was then packed with autograft as well as allograft, as was the appropriate-sized intervertebral  spacer.  The spacer was then tamped into position in the usual fashion.  I was very pleased with the press-fit of the implant. Distraction was then discontinued across the L4-5 space.  Then, in a similar manner, I performed an L3-4 decompression, meticulously removing the overgrowth of bone and soft tissue, decompressing the right and left L3-4 levels.  I then proceeded with an L3-4 diskectomy, with an assistant holding gentle medial retraction of the right L4 nerve.  Once the diskectomy was accomplished, the endplates were prepared and the intervertebral space was packed with allograft and autograft, as was the appropriate-sized intervertebral spacer.  The appropriate-sized intervertebral spacer was then tamped into position in the usual fashion.  I was very pleased with the press-fit of the implant. Distraction was then discontinued on the contralateral left side.  I then placed 6-mm screws of the appropriate length into the L3, L4, and L5 pedicles on the right.  Once again, a 65-mm rod was secured into the tulip heads of the screws.  Caps were then placed, and a final locking procedure was performed on the right and left sides.  I was very pleased with the final appearance of the AP and lateral fluoroscopic images. There was no abnormal EMG activity noted throughout the entire surgery from start to finish.  The wound was copiously irrigated with a total of approximately 3 L of normal saline throughout the surgery.  At this point, I did pack allograft and autograft into the posterolateral gutter on the left side to help aid in the success of the fusion.  A #15 deep Blake drain was then placed.  The fascia was then closed using #1 Vicryl.  The subcutaneous layer was closed using 2-0 Vicryl followed by 4-0 Monocryl.  Benzoin and Steri-Strips were applied followed by sterile dressing.  All instrument counts were correct at the termination of the procedure.  Of note, Pricilla Holm was my  assistant throughout surgery, and did aid in retraction, suctioning, and closure from start to finish.     Phylliss Bob, MD     MD/MEDQ  D:  09/29/2016  T:  09/30/2016  Job:  [id number]  cc:   Lennette Bihari L. Little, M.D.

## 2016-09-30 NOTE — Evaluation (Signed)
Occupational Therapy Evaluation and Discharge  Patient Details Name: Manuel Hood MRN: [id number] DOB: 07-23-1950 Today's Date: 09/30/2016    History of Present Illness Pt is a 66 y.o. male s/p L3-5 TLIF. PMHx: Arthritis, GERD< HTN, Back sx x2, R TKA, R rotator cuff repair.   Clinical Impression   Pt reports he was independent with ADL PTA. Currently pt min assist with ADL and min guard for functional mobility. All back, safety, and ADL education completed with pt. Pt planning to d/c home with 24/7 supervision from family. No further acute OT needs identified; signing off at this time. Please re-consult if needs change. Thank you for this referral.    Follow Up Recommendations  No OT follow up;Supervision/Assistance - 24 hour (initially)    Equipment Recommendations  None recommended by OT    Recommendations for Other Services PT consult     Precautions / Restrictions Precautions Precautions: Back Precaution Booklet Issued: No Precaution Comments: Educated pt on back precautions Required Braces or Orthoses: Spinal Brace Spinal Brace: Thoracolumbosacral orthotic Restrictions Weight Bearing Restrictions: No      Mobility Bed Mobility Overal bed mobility: Needs Assistance Bed Mobility: Rolling;Sidelying to Sit;Sit to Sidelying Rolling: Supervision Sidelying to sit: Supervision     Sit to sidelying: Supervision General bed mobility comments: HOB flat, no use of rail. Cues for technique.  Transfers Overall transfer level: Needs assistance Equipment used: None Transfers: Sit to/from Stand Sit to Stand: Min guard         General transfer comment: for safety, increased time and effort    Balance Overall balance assessment: Needs assistance Sitting-balance support: Feet supported;No upper extremity supported Sitting balance-Leahy Scale: Good     Standing balance support: No upper extremity supported;During functional activity Standing balance-Leahy Scale: Fair                              ADL either performed or assessed with clinical judgement   ADL Overall ADL's : Needs assistance/impaired Eating/Feeding: Set up;Sitting   Grooming: Supervision/safety;Standing Grooming Details (indicate cue type and reason): Educated on use of 2 cups for oral care. Upper Body Bathing: Set up;Supervision/ safety;Sitting   Lower Body Bathing: Minimal assistance;Sit to/from stand   Upper Body Dressing : Minimal assistance;Sitting Upper Body Dressing Details (indicate cue type and reason): for brace management Lower Body Dressing: Minimal assistance;Sit to/from stand Lower Body Dressing Details (indicate cue type and reason): Pt unable to cross foot over opposite knee. Wife to assist with ADL as needed. Toilet Transfer: Min guard;Ambulation;Comfort height toilet;Grab bars   Toileting- Clothing Manipulation and Hygiene: Supervision/safety;Sit to/from stand Toileting - Clothing Manipulation Details (indicate cue type and reason): Educated on proper technique for peri care without twisting Tub/ Shower Transfer: Min guard;Tub transfer;Ambulation Tub/Shower Transfer Details (indicate cue type and reason): Simulated tub transfer in room with min guard. Recommend supervision for tub transfer initially-pt agreeable Functional mobility during ADLs: Min guard General ADL Comments: Educated pt on maintaining precautions during functional activities, keeping frequently used items at counter top height, frequent mobility thorughout the day upon return home, log roll for bed mobility.     Vision         Perception     Praxis      Pertinent Vitals/Pain Pain Assessment: 0-10 Pain Score: 6  Pain Location: back Pain Descriptors / Indicators: Discomfort;Sore Pain Intervention(s): Monitored during session     Hand Dominance     Extremity/Trunk Assessment Upper  Extremity Assessment Upper Extremity Assessment: Overall WFL for tasks assessed   Lower  Extremity Assessment Lower Extremity Assessment: Defer to PT evaluation   Cervical / Trunk Assessment Cervical / Trunk Assessment: Other exceptions Cervical / Trunk Exceptions: s/p spinal sx   Communication Communication Communication: No difficulties   Cognition Arousal/Alertness: Awake/alert Behavior During Therapy: WFL for tasks assessed/performed Overall Cognitive Status: Within Functional Limits for tasks assessed                                     General Comments       Exercises     Shoulder Instructions      Home Living Family/patient expects to be discharged to:: Private residence Living Arrangements: Spouse/significant other Available Help at Discharge: Family;Available 24 hours/day Type of Home: House Home Access: Stairs to enter     Home Layout: One level     Bathroom Shower/Tub: Tub/shower unit;Door   Bathroom Toilet: Handicapped height     Home Equipment: Environmental consultant - 2 wheels;Crutches          Prior Functioning/Environment Level of Independence: Independent                 OT Problem List:        OT Treatment/Interventions:      OT Goals(Current goals can be found in the care plan section) Acute Rehab OT Goals Patient Stated Goal: return home OT Goal Formulation: All assessment and education complete, DC therapy  OT Frequency:     Barriers to D/C:            Co-evaluation              AM-PAC PT "6 Clicks" Daily Activity     Outcome Measure Help from another person eating meals?: None Help from another person taking care of personal grooming?: A Little Help from another person toileting, which includes using toliet, bedpan, or urinal?: A Little Help from another person bathing (including washing, rinsing, drying)?: A Little Help from another person to put on and taking off regular upper body clothing?: A Little Help from another person to put on and taking off regular lower body clothing?: A Little 6 Click  Score: 19   End of Session Equipment Utilized During Treatment: Back brace Nurse Communication: Mobility status;Other (comment) (no equipment or f/u needs)  Activity Tolerance: Patient tolerated treatment well Patient left: in chair;with call bell/phone within reach  OT Visit Diagnosis: Other abnormalities of gait and mobility (R26.89);Pain Pain - part of body:  (back)                Time: 0277-4128 OT Time Calculation (min): 23 min Charges:  OT General Charges $OT Visit: 1 Visit OT Evaluation $OT Eval Moderate Complexity: 1 Mod OT Treatments $Self Care/Home Management : 8-22 mins G-Codes:     Deetta Siegmann A. Ulice Brilliant, M.S., OTR/L Pager: [place] 09/30/2016, 9:26 AM

## 2016-09-30 NOTE — Progress Notes (Signed)
    Patient doing well Patient denies right leg pain Pain is minimal  Patient has been walking   Physical Exam: Vitals:   09/30/16 0019 09/30/16 0400  BP: 131/67 117/65  Pulse: 85 93  Resp: 20 20  Temp: 98.4 F (36.9 C) 98.5 F (36.9 C)  SpO2: 97% 97%   Patient sitting up in a chair, looks very comfortable Dressing in place NVI  Drain output: 190/10 hours  POD #1 s/p L3-5 decompression and fusion, doing very well  - up with PT/OT, encourage ambulation - Percocet for pain, Valium for muscle spasms - likely d/c home later today vs tomorrow with f/u in 2 weeks - will maintain drain for now, may d/c drain upon discharge today vs d/c patient with drain education

## 2016-10-01 DIAGNOSIS — Z9889 Other specified postprocedural states: Secondary | ICD-10-CM | POA: Diagnosis not present

## 2016-10-07 NOTE — Discharge Summary (Signed)
Patient ID: Manuel Hood MRN: [id number] DOB/AGE: 07-04-1950 66 y.o.  Admit date: 09/29/2016 Discharge date: 09/30/2016  Admission Diagnoses:  Active Problems:   Radiculopathy   Discharge Diagnoses:  Same  Past Medical History:  Diagnosis Date  . Arthritis   . GERD (gastroesophageal reflux disease)   . Hypertension   . Seasonal allergies   . Sinus pain     Surgeries: Procedure(s): RIGHT SIDED LUMBAR 3-4, LUMBAR 4-5 TRANSFORAMINAL LUMBAR INTERBODY FUSION WITH INSTRUMENTATION AND ALLOGRAFT on 09/29/2016   Consultants: None  Discharged Condition: Improved  Hospital Course: Manuel Hood is an 66 y.o. male who was admitted 09/29/2016 for operative treatment of spinal stenosis. Patient has severe unremitting pain that affects sleep, daily activities, and work/hobbies. After pre-op clearance the patient was taken to the operating room on 09/29/2016 and underwent  Procedure(s): RIGHT SIDED LUMBAR 3-4, LUMBAR 4-5 TRANSFORAMINAL LUMBAR INTERBODY FUSION WITH INSTRUMENTATION AND ALLOGRAFT.    Patient was given perioperative antibiotics:  Anti-infectives    Start     Dose/Rate Route Frequency Ordered Stop   09/29/16 2300  ceFAZolin (ANCEF) IVPB 2g/100 mL premix     2 g 200 mL/hr over 30 Minutes Intravenous Every 8 hours 09/29/16 2111 09/30/16 0749   09/29/16 0814  ceFAZolin (ANCEF) 2-4 GM/100ML-% IVPB    Comments:  Rosenberger, Meredit: cabinet override      09/29/16 0814 09/29/16 1141   09/29/16 0811  ceFAZolin (ANCEF) IVPB 2g/100 mL premix     2 g 200 mL/hr over 30 Minutes Intravenous On call to O.R. 09/29/16 8676 09/29/16 1545       Patient was given sequential compression devices, early ambulation to prevent DVT.  Patient benefited maximally from hospital stay and there were no complications.    Recent vital signs: BP 110/75   Pulse (!) 110   Temp 99.6 F (37.6 C)   Resp 16   SpO2 96%    Discharge Medications:   Allergies as of 09/30/2016   No Known Allergies       Medication List    STOP taking these medications   oxyCODONE 5 MG immediate release tablet Commonly known as:  Oxy IR/ROXICODONE     TAKE these medications   aspirin EC 81 MG tablet Take 81 mg by mouth daily.   calcium carbonate 600 MG Tabs tablet Commonly known as:  OS-CAL Take 600 mg by mouth daily.   cholecalciferol 1000 units tablet Commonly known as:  VITAMIN D Take 1,000 Units by mouth daily.   D3-1000 1000 units tablet Generic drug:  Cholecalciferol Take 1,000 Units by mouth daily.   DSS 100 MG Caps Take 100 mg by mouth 2 (two) times daily.   ferrous sulfate 325 (65 FE) MG tablet Take 1 tablet (325 mg total) by mouth 3 (three) times daily after meals.   fluticasone 50 MCG/ACT nasal spray Commonly known as:  FLONASE Place 1 spray into both nostrils as needed for allergies or rhinitis.   gabapentin 300 MG capsule Commonly known as:  NEURONTIN Take 300 mg by mouth 3 (three) times daily.   losartan-hydrochlorothiazide 100-12.5 MG tablet Commonly known as:  HYZAAR Take 1 tablet by mouth daily.   methocarbamol 500 MG tablet Commonly known as:  ROBAXIN Take 1 tablet (500 mg total) by mouth every 6 (six) hours as needed (muscle spasms).   ONE-A-DAY 50 PLUS PO Take 1 tablet by mouth daily.   ICAPS AREDS 2 PO Take 1 tablet by mouth daily.   polyethylene  glycol packet Commonly known as:  MIRALAX / GLYCOLAX Take 17 g by mouth 2 (two) times daily.       Diagnostic Studies: Dg Chest 2 View  Result Date: 09/22/2016 CLINICAL DATA:  Preoperative examination prior to lumbar surgery. Former smoker. History of hypertension and gastroesophageal reflux. EXAM: CHEST  2 VIEW COMPARISON:  Chest x-ray of Jun 22, 2012 FINDINGS: The lungs are well-expanded and clear. The heart and pulmonary vascularity are normal. The mediastinum is normal in width. There is no pleural effusion. There is calcification in the wall of the aortic arch. There is mild multilevel degenerative  disc disease of the thoracic spine. IMPRESSION: There is no acute cardiopulmonary abnormality. Thoracic aortic atherosclerosis. Electronically Signed   By: David  Martinique M.D.   On: 09/22/2016 09:46   Dg Lumbar Spine 2-3 Views  Result Date: 09/29/2016 CLINICAL DATA:  L3 through 5 TLIF EXAM: LUMBAR SPINE - 2-3 VIEW; DG C-ARM GT 120 MIN COMPARISON:  MRI 07/05/2016, FINDINGS: Total fluoroscopy time was 1 minutes 2 seconds. Two low resolution intraoperative views of the lumbar spine. The images demonstrate posterior rod and screw fixation with interbody device from L3 through L5 IMPRESSION: Intraoperative fluoroscopic assistance provided during lumbar spine surgery Electronically Signed   By: Donavan Foil M.D.   On: 09/29/2016 19:48   Dg Lumbar Spine 1 View  Result Date: 09/29/2016 CLINICAL DATA:  Lower lumbosacral spine fusion. EXAM: LUMBAR SPINE - 1 VIEW COMPARISON:  MRI of the lumbosacral spine 07/05/2016 FINDINGS: Single lateral intraoperative radiograph of the lumbosacral spine demonstrates metallic artifacts within the posterior soft tissues at the level of L3 and L4 posterior processes. Multilevel osteoarthritic changes. IMPRESSION: Intraoperative radiograph for verification of instrumentation placement. Electronically Signed   By: Fidela Salisbury M.D.   On: 09/29/2016 19:55   Dg C-arm Gt 120 Min  Result Date: 09/29/2016 CLINICAL DATA:  L3 through 5 TLIF EXAM: LUMBAR SPINE - 2-3 VIEW; DG C-ARM GT 120 MIN COMPARISON:  MRI 07/05/2016, FINDINGS: Total fluoroscopy time was 1 minutes 2 seconds. Two low resolution intraoperative views of the lumbar spine. The images demonstrate posterior rod and screw fixation with interbody device from L3 through L5 IMPRESSION: Intraoperative fluoroscopic assistance provided during lumbar spine surgery Electronically Signed   By: Donavan Foil M.D.   On: 09/29/2016 19:48    Disposition: 01-Home or Self Care   POD #1 s/p L3-5 decompression and fusion, doing very  well  - up with PT/OT, encourage ambulation - Percocet for pain, Valium for muscle spasms - will maintain drain for now, d/c patient with drain education -Written scripts for pain signed and in chart -D/C instructions sheet printed and in chart -D/C today  -F/U in office 2 weeks   Signed: Justice Britain 10/07/2016, 8:28 AM

## 2016-10-08 ENCOUNTER — Encounter (HOSPITAL_COMMUNITY): Payer: Self-pay | Admitting: Orthopedic Surgery

## 2016-10-13 DIAGNOSIS — Z9889 Other specified postprocedural states: Secondary | ICD-10-CM | POA: Diagnosis not present

## 2016-10-19 DIAGNOSIS — Z981 Arthrodesis status: Secondary | ICD-10-CM | POA: Diagnosis not present

## 2016-11-09 DIAGNOSIS — M5416 Radiculopathy, lumbar region: Secondary | ICD-10-CM | POA: Diagnosis not present

## 2016-11-09 DIAGNOSIS — Z9889 Other specified postprocedural states: Secondary | ICD-10-CM | POA: Diagnosis not present

## 2016-12-21 DIAGNOSIS — M545 Low back pain: Secondary | ICD-10-CM | POA: Diagnosis not present

## 2016-12-30 DIAGNOSIS — M4326 Fusion of spine, lumbar region: Secondary | ICD-10-CM | POA: Diagnosis not present

## 2016-12-30 DIAGNOSIS — Z4789 Encounter for other orthopedic aftercare: Secondary | ICD-10-CM | POA: Diagnosis not present

## 2017-01-05 DIAGNOSIS — Z4789 Encounter for other orthopedic aftercare: Secondary | ICD-10-CM | POA: Diagnosis not present

## 2017-01-05 DIAGNOSIS — M4326 Fusion of spine, lumbar region: Secondary | ICD-10-CM | POA: Diagnosis not present

## 2017-01-07 DIAGNOSIS — M4326 Fusion of spine, lumbar region: Secondary | ICD-10-CM | POA: Diagnosis not present

## 2017-01-07 DIAGNOSIS — Z4789 Encounter for other orthopedic aftercare: Secondary | ICD-10-CM | POA: Diagnosis not present

## 2017-01-17 DIAGNOSIS — M4326 Fusion of spine, lumbar region: Secondary | ICD-10-CM | POA: Diagnosis not present

## 2017-01-17 DIAGNOSIS — Z4789 Encounter for other orthopedic aftercare: Secondary | ICD-10-CM | POA: Diagnosis not present

## 2017-01-18 DIAGNOSIS — M5416 Radiculopathy, lumbar region: Secondary | ICD-10-CM | POA: Diagnosis not present

## 2017-01-19 DIAGNOSIS — M4326 Fusion of spine, lumbar region: Secondary | ICD-10-CM | POA: Diagnosis not present

## 2017-01-19 DIAGNOSIS — Z4789 Encounter for other orthopedic aftercare: Secondary | ICD-10-CM | POA: Diagnosis not present

## 2017-02-02 DIAGNOSIS — Z4789 Encounter for other orthopedic aftercare: Secondary | ICD-10-CM | POA: Diagnosis not present

## 2017-02-02 DIAGNOSIS — M4326 Fusion of spine, lumbar region: Secondary | ICD-10-CM | POA: Diagnosis not present

## 2017-02-02 DIAGNOSIS — M722 Plantar fascial fibromatosis: Secondary | ICD-10-CM | POA: Diagnosis not present

## 2017-02-04 DIAGNOSIS — Z4789 Encounter for other orthopedic aftercare: Secondary | ICD-10-CM | POA: Diagnosis not present

## 2017-02-04 DIAGNOSIS — M4326 Fusion of spine, lumbar region: Secondary | ICD-10-CM | POA: Diagnosis not present

## 2017-02-24 DIAGNOSIS — J0100 Acute maxillary sinusitis, unspecified: Secondary | ICD-10-CM | POA: Diagnosis not present

## 2017-03-30 DIAGNOSIS — J0101 Acute recurrent maxillary sinusitis: Secondary | ICD-10-CM | POA: Diagnosis not present

## 2017-03-31 ENCOUNTER — Other Ambulatory Visit: Payer: Self-pay | Admitting: Family Medicine

## 2017-03-31 DIAGNOSIS — J0101 Acute recurrent maxillary sinusitis: Secondary | ICD-10-CM

## 2017-04-07 ENCOUNTER — Ambulatory Visit
Admission: RE | Admit: 2017-04-07 | Discharge: 2017-04-07 | Disposition: A | Discharge status: Home / Self Care | Payer: Medicare HMO | Source: Ambulatory Visit | Attending: Family Medicine | Admitting: Family Medicine

## 2017-04-07 DIAGNOSIS — R51 Headache: Secondary | ICD-10-CM | POA: Diagnosis not present

## 2017-04-07 DIAGNOSIS — J0101 Acute recurrent maxillary sinusitis: Secondary | ICD-10-CM

## 2017-04-22 DIAGNOSIS — E785 Hyperlipidemia, unspecified: Secondary | ICD-10-CM | POA: Diagnosis not present

## 2017-04-22 DIAGNOSIS — Z125 Encounter for screening for malignant neoplasm of prostate: Secondary | ICD-10-CM | POA: Diagnosis not present

## 2017-04-22 DIAGNOSIS — I10 Essential (primary) hypertension: Secondary | ICD-10-CM | POA: Diagnosis not present

## 2017-04-28 DIAGNOSIS — Z125 Encounter for screening for malignant neoplasm of prostate: Secondary | ICD-10-CM | POA: Diagnosis not present

## 2017-04-28 DIAGNOSIS — Z8719 Personal history of other diseases of the digestive system: Secondary | ICD-10-CM | POA: Diagnosis not present

## 2017-04-28 DIAGNOSIS — Z0000 Encounter for general adult medical examination without abnormal findings: Secondary | ICD-10-CM | POA: Diagnosis not present

## 2017-04-28 DIAGNOSIS — Z1389 Encounter for screening for other disorder: Secondary | ICD-10-CM | POA: Diagnosis not present

## 2017-04-28 DIAGNOSIS — D229 Melanocytic nevi, unspecified: Secondary | ICD-10-CM | POA: Diagnosis not present

## 2017-04-28 DIAGNOSIS — Z86010 Personal history of colonic polyps: Secondary | ICD-10-CM | POA: Diagnosis not present

## 2017-04-28 DIAGNOSIS — Z79899 Other long term (current) drug therapy: Secondary | ICD-10-CM | POA: Diagnosis not present

## 2017-04-28 DIAGNOSIS — E785 Hyperlipidemia, unspecified: Secondary | ICD-10-CM | POA: Diagnosis not present

## 2017-04-28 DIAGNOSIS — R8290 Unspecified abnormal findings in urine: Secondary | ICD-10-CM | POA: Diagnosis not present

## 2017-04-28 DIAGNOSIS — I10 Essential (primary) hypertension: Secondary | ICD-10-CM | POA: Diagnosis not present

## 2017-08-11 DIAGNOSIS — Z0100 Encounter for examination of eyes and vision without abnormal findings: Secondary | ICD-10-CM | POA: Diagnosis not present

## 2017-09-30 DIAGNOSIS — J0100 Acute maxillary sinusitis, unspecified: Secondary | ICD-10-CM | POA: Diagnosis not present

## 2017-10-17 DIAGNOSIS — M545 Low back pain: Secondary | ICD-10-CM | POA: Diagnosis not present

## 2017-10-21 ENCOUNTER — Other Ambulatory Visit: Payer: Self-pay | Admitting: Orthopedic Surgery

## 2017-10-21 DIAGNOSIS — M545 Low back pain, unspecified: Secondary | ICD-10-CM

## 2017-10-25 ENCOUNTER — Ambulatory Visit
Admission: RE | Admit: 2017-10-25 | Discharge: 2017-10-25 | Disposition: A | Discharge status: Home / Self Care | Payer: Medicare HMO | Source: Ambulatory Visit | Attending: Orthopedic Surgery | Admitting: Orthopedic Surgery

## 2017-10-25 DIAGNOSIS — M545 Low back pain, unspecified: Secondary | ICD-10-CM

## 2017-10-25 DIAGNOSIS — M4326 Fusion of spine, lumbar region: Secondary | ICD-10-CM | POA: Diagnosis not present

## 2017-10-25 DIAGNOSIS — M5126 Other intervertebral disc displacement, lumbar region: Secondary | ICD-10-CM | POA: Diagnosis not present

## 2017-10-27 ENCOUNTER — Ambulatory Visit
Admission: RE | Admit: 2017-10-27 | Discharge: 2017-10-27 | Disposition: A | Discharge status: Home / Self Care | Payer: Medicare HMO | Source: Ambulatory Visit | Attending: Orthopedic Surgery | Admitting: Orthopedic Surgery

## 2017-10-27 DIAGNOSIS — M545 Low back pain, unspecified: Secondary | ICD-10-CM

## 2017-10-27 DIAGNOSIS — M48061 Spinal stenosis, lumbar region without neurogenic claudication: Secondary | ICD-10-CM | POA: Diagnosis not present

## 2017-10-31 DIAGNOSIS — M5416 Radiculopathy, lumbar region: Secondary | ICD-10-CM | POA: Diagnosis not present

## 2017-11-09 DIAGNOSIS — M5416 Radiculopathy, lumbar region: Secondary | ICD-10-CM | POA: Diagnosis not present

## 2017-11-09 DIAGNOSIS — M5126 Other intervertebral disc displacement, lumbar region: Secondary | ICD-10-CM | POA: Diagnosis not present

## 2017-11-28 DIAGNOSIS — M545 Low back pain: Secondary | ICD-10-CM | POA: Diagnosis not present

## 2017-11-28 DIAGNOSIS — M5416 Radiculopathy, lumbar region: Secondary | ICD-10-CM | POA: Diagnosis not present

## 2017-12-14 ENCOUNTER — Other Ambulatory Visit: Payer: Self-pay | Admitting: *Deleted

## 2017-12-15 ENCOUNTER — Telehealth: Payer: Self-pay | Admitting: Vascular Surgery

## 2017-12-15 NOTE — Telephone Encounter (Signed)
-----   Message from Willy Eddy, RN sent at 12/14/2017  9:17 AM EST ----- Regarding: office appointment needed Contact: [phone] Please make an office appointment with Dr. EARLY. Scheduled for ALIF surgery with Dr. Lynann Bologna 02/22/2017. Remind patient to bring any films related to this surgery to this appointment.  If they are not in Epic and he can call Butch Penny at Dr. Laurena Bering office if any questions about this. Thanks [employer]

## 2017-12-15 NOTE — Telephone Encounter (Signed)
sch appt spk to pt 02/07/18 1045am f/u MD

## 2018-02-07 ENCOUNTER — Encounter: Payer: Self-pay | Admitting: Vascular Surgery

## 2018-02-07 ENCOUNTER — Other Ambulatory Visit: Payer: Self-pay

## 2018-02-07 ENCOUNTER — Ambulatory Visit: Payer: Medicare HMO | Admitting: Vascular Surgery

## 2018-02-07 VITALS — BP 123/80 | HR 87 | Temp 97.9°F | Resp 20 | Ht 69.0 in | Wt 225.0 lb

## 2018-02-07 DIAGNOSIS — M5136 Other intervertebral disc degeneration, lumbar region: Secondary | ICD-10-CM

## 2018-02-07 NOTE — Progress Notes (Signed)
[facility]  Patient name: Manuel Hood MRN: [id number] DOB: [birth year] Sex: male  REASON FOR CONSULT: Discuss access for anterior exposure for L4-5 lumbar fusion  HPI: Manuel Hood is a 68 y.o. male, who is in today for discussion of anterior approach for L4-5 lumbar fusion.  He has been treated by Dr. Lynann Bologna.  He has had a prior posterior fusion and has had persistent discomfort.  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  He is extremely uncomfortable and is unable to sit stand or lie down and make himself comfortable.  He is here today with his wife.  He has severe pain in his back this also extends into his right leg.  He has no history of cardiac disease.  Past Medical History:  Diagnosis Date  . Arthritis   . GERD (gastroesophageal reflux disease)   . Hypertension   . Low back pain   . Right leg pain   . Seasonal allergies   . Sinus pain     Family History  Problem Relation Age of Onset  . High blood pressure Mother   . Glaucoma Mother     SOCIAL HISTORY: Social History   Socioeconomic History  . Marital status: Married    Spouse name: Not on file  . Number of children: Not on file  . Years of education: Not on file  . Highest education level: Not on file  Occupational History  . Not on file  Social Needs  . Financial resource strain: Not on file  . Food insecurity:    Worry: Not on file    Inability: Not on file  . Transportation needs:    Medical: Not on file    Non-medical: Not on file  Tobacco Use  . Smoking status: Former Smoker    Years: 20.00    Types: Cigarettes    Last attempt to quit: 02/02/1996    Years since quitting: 22.0  . Smokeless tobacco: Never Used  Substance and Sexual Activity  . Alcohol use: Yes    Comment: 1 beer a day  . Drug use: No  . Sexual activity: Not on file  Lifestyle  . Physical activity:    Days per week: Not on file    Minutes per session: Not on file  . Stress: Not on file  Relationships  . Social connections:    Talks on phone: Not on file    Gets together: Not on file    Attends religious service: Not on file    Active member of club or organization: Not on file    Attends meetings of clubs or organizations: Not on file    Relationship status: Not on file  . Intimate partner violence:    Fear of current or ex partner: Not on file    Emotionally abused: Not on file    Physically abused: Not on file    Forced sexual activity: Not on file  Other Topics Concern  . Not on file  Social History Narrative  . Not on file    No Known Allergies  Current Outpatient Medications  Medication Sig Dispense Refill  . aspirin EC 81 MG tablet Take 81 mg by mouth daily.    . calcium carbonate (OS-CAL) 600 MG TABS Take 600 mg by mouth daily.      . Cholecalciferol (D3-1000) 1000 units tablet Take 1,000 Units by mouth daily.    Marland Kitchen  cyclobenzaprine (FLEXERIL) 5 MG tablet Take 5 mg by mouth at bedtime.    . docusate sodium 100 MG CAPS Take 100 mg by mouth 2 (two) times daily. 10 capsule 0  . fluticasone (FLONASE) 50 MCG/ACT nasal spray Place 1 spray into both nostrils as needed for allergies or rhinitis.    Marland Kitchen HYDROcodone-acetaminophen (NORCO/VICODIN) 5-325 MG tablet Take 1-2 tablets by mouth every 6 (six) hours as needed for moderate pain.    Marland Kitchen losartan-hydrochlorothiazide (HYZAAR) 100-12.5 MG tablet Take 1 tablet by mouth daily.    . meloxicam (MOBIC) 15 MG tablet Take 15 mg by mouth daily.    . methocarbamol (ROBAXIN) 500 MG tablet Take 1 tablet (500 mg total) by mouth every 6 (six) hours as needed (muscle spasms). 50 tablet 0  . Multiple Vitamins-Minerals (ONE-A-DAY 50 PLUS PO) Take 1 tablet by mouth daily.      . pregabalin (LYRICA) 75 MG capsule Take 75 mg by mouth 2 (two) times daily.    Marland Kitchen gabapentin (NEURONTIN) 300 MG capsule Take 300 mg by mouth 3 (three) times daily.     No  current facility-administered medications for this visit.     REVIEW OF SYSTEMS:  [X]  denotes positive finding, [ ]  denotes negative finding Cardiac  Comments:  Chest pain or chest pressure:    Shortness of breath upon exertion:    Short of breath when lying flat:    Irregular heart rhythm:        Vascular    Pain in calf, thigh, or hip brought on by ambulation:    Pain in feet at night that wakes you up from your sleep:  x   Blood clot in your veins:    Leg swelling:         Pulmonary    Oxygen at home:    Productive cough:     Wheezing:         Neurologic    Sudden weakness in arms or legs:     Sudden numbness in arms or legs:     Sudden onset of difficulty speaking or slurred speech:    Temporary loss of vision in one eye:     Problems with dizziness:         Gastrointestinal    Blood in stool:     Vomited blood:         Genitourinary    Burning when urinating:     Blood in urine:        Psychiatric    Major depression:         Hematologic    Bleeding problems:    Problems with blood clotting too easily:        Skin    Rashes or ulcers:        Constitutional    Fever or chills:      PHYSICAL EXAM: Vitals:   02/07/18 1022  BP: 123/80  Pulse: 87  Resp: 20  Temp: 97.9 F (36.6 C)  SpO2: 98%  Weight: 225 lb (102.1 kg)  Height: 5\' 9"  (1.753 m)    GENERAL: The patient is a well-nourished male, in no acute distress. The vital signs are documented above. CARDIOVASCULAR: Carotid arteries without bruits bilaterally.  Radial pulses 2+ bilaterally he has palpable popliteal pulses bilaterally PULMONARY: There is good air exchange  ABDOMEN: Soft and non-tender.  No bruits and moderate obesity MUSCULOSKELETAL: There are no major deformities or cyanosis. NEUROLOGIC: No focal weakness or paresthesias are detected. SKIN: There  are no ulcers or rashes noted. PSYCHIATRIC: The patient has a normal affect.  DATA:  I reviewed his CT scan of his lumbar region.  He  does have scattered calcified atherosclerotic changes in his aorta and iliac vessels  MEDICAL ISSUES: Had a long discussion with the patient and his wife.  I explained my role for exposure for fusion.  He is had only appendectomy as a child is his only intra-abdominal surgery.  He has mild obesity.  He does have some calcification in his aorta iliac vessels but this is not extreme.  I did explain mobilization of the rectus muscle, intraperitoneal contents, left ureter, arterial and venous structures overlying the spine.  Discussed the potential for injury to all of these.  I did explain with the calcification that we would be concern regarding mobilization but this does not look prohibitive.  Also explained the potential for a major venous injury and no treatment required for this.  He understands and wishes to proceed as scheduled on 02/22/2018 4 anterior treatment   Rosetta Posner, MD [facility] Tel [phone] Pager [phone]

## 2018-02-13 ENCOUNTER — Other Ambulatory Visit: Payer: Self-pay | Admitting: Orthopedic Surgery

## 2018-02-16 ENCOUNTER — Other Ambulatory Visit: Payer: Self-pay

## 2018-02-16 ENCOUNTER — Encounter (HOSPITAL_COMMUNITY)
Admission: RE | Admit: 2018-02-16 | Discharge: 2018-02-16 | Disposition: A | Discharge status: Home / Self Care | Payer: Medicare HMO | Source: Ambulatory Visit | Attending: Orthopedic Surgery | Admitting: Orthopedic Surgery

## 2018-02-16 ENCOUNTER — Encounter (HOSPITAL_COMMUNITY): Payer: Self-pay

## 2018-02-16 DIAGNOSIS — Z01818 Encounter for other preprocedural examination: Secondary | ICD-10-CM | POA: Insufficient documentation

## 2018-02-16 LAB — CBC WITH DIFFERENTIAL/PLATELET
ABS IMMATURE GRANULOCYTES: 0.04 10*3/uL (ref 0.00–0.07)
Basophils Absolute: 0.1 10*3/uL (ref 0.0–0.1)
Basophils Relative: 1 %
Eosinophils Absolute: 0.8 10*3/uL — ABNORMAL HIGH (ref 0.0–0.5)
Eosinophils Relative: 9 %
HCT: 46 % (ref 39.0–52.0)
Hemoglobin: 15.8 g/dL (ref 13.0–17.0)
IMMATURE GRANULOCYTES: 0 %
LYMPHS ABS: 2.2 10*3/uL (ref 0.7–4.0)
Lymphocytes Relative: 23 %
MCH: 31.5 pg (ref 26.0–34.0)
MCHC: 34.3 g/dL (ref 30.0–36.0)
MCV: 91.6 fL (ref 80.0–100.0)
Monocytes Absolute: 1 10*3/uL (ref 0.1–1.0)
Monocytes Relative: 10 %
NEUTROS PCT: 57 %
Neutro Abs: 5.6 10*3/uL (ref 1.7–7.7)
Platelets: 245 10*3/uL (ref 150–400)
RBC: 5.02 MIL/uL (ref 4.22–5.81)
RDW: 12.6 % (ref 11.5–15.5)
WBC: 9.6 10*3/uL (ref 4.0–10.5)
nRBC: 0 % (ref 0.0–0.2)

## 2018-02-16 LAB — COMPREHENSIVE METABOLIC PANEL
ALT: 24 U/L (ref 0–44)
AST: 23 U/L (ref 15–41)
Albumin: 3.9 g/dL (ref 3.5–5.0)
Alkaline Phosphatase: 74 U/L (ref 38–126)
Anion gap: 9 (ref 5–15)
BUN: 16 mg/dL (ref 8–23)
CO2: 26 mmol/L (ref 22–32)
Calcium: 10.1 mg/dL (ref 8.9–10.3)
Chloride: 105 mmol/L (ref 98–111)
Creatinine, Ser: 1.16 mg/dL (ref 0.61–1.24)
GFR calc Af Amer: >60 mL/min (ref >60)
GFR calc non Af Amer: >60 mL/min (ref >60)
GLUCOSE: 88 mg/dL (ref 70–99)
Potassium: 4 mmol/L (ref 3.5–5.1)
SODIUM: 140 mmol/L (ref 135–145)
Total Bilirubin: 0.6 mg/dL (ref 0.3–1.2)
Total Protein: 6.7 g/dL (ref 6.5–8.1)

## 2018-02-16 LAB — URINALYSIS, ROUTINE W REFLEX MICROSCOPIC
Bacteria, UA: NONE SEEN
Bilirubin Urine: NEGATIVE
GLUCOSE, UA: NEGATIVE mg/dL
Hgb urine dipstick: NEGATIVE
Ketones, ur: NEGATIVE mg/dL
NITRITE: NEGATIVE
Protein, ur: NEGATIVE mg/dL
Specific Gravity, Urine: 1.012 (ref 1.005–1.030)
pH: 6 (ref 5.0–8.0)

## 2018-02-16 LAB — SURGICAL PCR SCREEN
MRSA, PCR: NEGATIVE
Staphylococcus aureus: NEGATIVE

## 2018-02-16 LAB — PROTIME-INR
INR: 1.01
Prothrombin Time: 13.2 seconds (ref 11.4–15.2)

## 2018-02-16 LAB — APTT: aPTT: 32 seconds (ref 24–36)

## 2018-02-16 NOTE — Progress Notes (Signed)
PCP - Dr Drema Halon Cardiologist - no  Chest x-ray - no  EKG - 02/16/2018  Stress Test - no  ECHO - no  Cardiac Cath - no  Sleep Study - no CPAP - no  LABS-02/16/2018 ASA-  HA1C-na Fasting Blood Sugar - 0 Checks Blood Sugar _0____ times a day  Pt denies having chest pain, sob, or fever at this time. All instructions explained to the pt, with a verbal understanding of the material. Pt agrees to go over the instructions while at home for a better understanding. The opportunity to ask questions was provided.

## 2018-02-16 NOTE — Pre-Procedure Instructions (Addendum)
Manuel Hood  02/16/2018     Your procedure is scheduled on  Wednesday, January 22  Report to [facility] Admitting at 5:30 AM                   Your surgery or procedure is scheduled for 7:30 A.M.   Call this number if you have problems the morning of surgery: [phone]  This is the number for the Pre- Surgical Desk.     Remember:  Do not eat or drink after midnight Tuesday, January 21.  Take these medicines the morning of surgery with A SIP OF WATER :               methocarbamol (ROBAXIN)               pregabalin (LYRICA)                              If  Needed:               acetaminophen-codeine (TYLENOL #3)  or              HYDROcodone-acetaminophen (NORCO/VICODIN)               fluticasone (FLONASE)  1 Week prior to surgery STOP taking Aspirin, Aspirin Products (Goody Powder, Excedrin Migraine), Ibuprofen (Advil), Naproxen (Aleve), Meloxicam,Vitamins and Herbal Products (ie Fish Oil).                Special instructions:   [facility]- Preparing For Surgery  Before surgery, you can play an important role. Because skin is not sterile, your skin needs to be as free of germs as possible. You can reduce the number of germs on your skin by washing with CHG (chlorahexidine gluconate) Soap before surgery.  CHG is an antiseptic cleaner which kills germs and bonds with the skin to continue killing germs even after washing.    Oral Hygiene is also important to reduce your risk of infection.  Remember - BRUSH YOUR TEETH THE MORNING OF SURGERY WITH YOUR REGULAR TOOTHPASTE  Please do not use if you have an allergy to CHG or antibacterial soaps. If your skin becomes reddened/irritated stop using the CHG.  Do not shave (including legs and underarms) for at least 48 hours prior to first CHG shower. It is OK to shave your face.  Please follow these instructions carefully.   1. Shower the NIGHT BEFORE SURGERY and the MORNING OF SURGERY with CHG.   2. If you chose to wash  your hair, wash your hair first as usual with your normal shampoo.  After you shampoo, wash your face and private area with the soap you use at home, then rinse your hair and body thoroughly to remove the shampoo and soap. 3. Use CHG as you would any other liquid soap. You can apply CHG directly to the skin and wash gently with a scrungie or a clean washcloth.   4. Apply the CHG Soap to your body ONLY FROM THE NECK DOWN.  Do not use on open wounds or open sores. Avoid contact with your eyes, ears, mouth and genitals (private parts).  5. Wash thoroughly, paying special attention to the area where your surgery will be performed.  6. Thoroughly rinse your body with warm water from the neck down.  7. DO NOT shower/wash with your normal soap after using and rinsing off the CHG Soap.  8. Pat yourself dry with a CLEAN TOWEL.  9. Wear CLEAN PAJAMAS to bed the night before surgery, wear comfortable clothes the morning of surgery  10. Place CLEAN SHEETS on your bed the night of your first shower and DO NOT SLEEP WITH PETS.  Day of Surgery: Shower as Instructed Above   Please wear clean clothes to the hospital/surgery center.   Do not wear lotions, powders, or perfumes, or deodorant Remember to brush your teeth WITH YOUR REGULAR TOOTHPASTE.             Do not wear jewelry, make-up or nail polish.  Do not wear lotions, powders, or perfumes, or deodorant.  Do not shave 48 hours prior to surgery.  Men may shave face and neck.  Do not bring valuables to the hospital.  [facility] is not responsible for any belongings or valuables.  Contacts, dentures or bridgework may not be worn into surgery.  Leave your suitcase in the car.  After surgery it may be brought to your room.  For patients admitted to the hospital, discharge time will be determined by your treatment team.  Please read over the following fact sheets that you were given:  Pain Booklet, Patient Instructions for Mupirocin Application,  Coughing and Deep Breathing, Surgical Site Infections.

## 2018-02-20 DIAGNOSIS — M5416 Radiculopathy, lumbar region: Secondary | ICD-10-CM | POA: Diagnosis not present

## 2018-02-21 ENCOUNTER — Encounter (HOSPITAL_COMMUNITY): Payer: Self-pay | Admitting: Anesthesiology

## 2018-02-21 NOTE — Anesthesia Preprocedure Evaluation (Addendum)
Anesthesia Evaluation  Patient identified by MRN, date of birth, ID band Patient awake    Reviewed: Allergy & Precautions, NPO status , Patient's Chart, lab work & pertinent test results  Airway Mallampati: II  TM Distance: >3 FB Neck ROM: Full    Dental no notable dental hx. (+) Teeth Intact   Pulmonary former smoker,    Pulmonary exam normal breath sounds clear to auscultation       Cardiovascular hypertension, Pt. on medications Normal cardiovascular exam Rhythm:Regular Rate:Normal     Neuro/Psych  Neuromuscular disease negative psych ROS   GI/Hepatic Neg liver ROS, GERD  Medicated and Controlled,  Endo/Other  Obesity   Renal/GU negative Renal ROS  negative genitourinary   Musculoskeletal  (+) Arthritis , Osteoarthritis,  Loose instrumentation s/p lumbar fusion 3 level  Right radiculopathy   Abdominal (+) + obese,   Peds  Hematology  (+) anemia ,   Anesthesia Other Findings   Reproductive/Obstetrics                            Anesthesia Physical Anesthesia Plan  ASA: II  Anesthesia Plan: General   Post-op Pain Management:    Induction:   PONV Risk Score and Plan: 3 and Ondansetron, Dexamethasone and Treatment may vary due to age or medical condition  Airway Management Planned: Oral ETT  Additional Equipment:   Intra-op Plan:   Post-operative Plan: Extubation in OR  Informed Consent: I have reviewed the patients History and Physical, chart, labs and discussed the procedure including the risks, benefits and alternatives for the proposed anesthesia with the patient or authorized representative who has indicated his/her understanding and acceptance.     Dental advisory given  Plan Discussed with: CRNA and Surgeon  Anesthesia Plan Comments:        Anesthesia Quick Evaluation

## 2018-02-22 ENCOUNTER — Inpatient Hospital Stay (HOSPITAL_COMMUNITY)
Admission: RE | Disposition: A | Discharge status: Home / Self Care | Payer: Self-pay | Source: Home / Self Care | Attending: Orthopedic Surgery

## 2018-02-22 ENCOUNTER — Inpatient Hospital Stay (HOSPITAL_COMMUNITY): Payer: Medicare HMO | Admitting: Anesthesiology

## 2018-02-22 ENCOUNTER — Inpatient Hospital Stay (HOSPITAL_COMMUNITY)
Admission: RE | Admit: 2018-02-22 | Discharge: 2018-02-25 | DRG: 455 | Disposition: A | Discharge status: Home / Self Care | Payer: Medicare HMO | Attending: Orthopedic Surgery | Admitting: Orthopedic Surgery

## 2018-02-22 ENCOUNTER — Inpatient Hospital Stay (HOSPITAL_COMMUNITY): Payer: Medicare HMO

## 2018-02-22 ENCOUNTER — Encounter (HOSPITAL_COMMUNITY): Payer: Self-pay | Admitting: *Deleted

## 2018-02-22 DIAGNOSIS — M5416 Radiculopathy, lumbar region: Secondary | ICD-10-CM | POA: Diagnosis not present

## 2018-02-22 DIAGNOSIS — M960 Pseudarthrosis after fusion or arthrodesis: Secondary | ICD-10-CM | POA: Diagnosis not present

## 2018-02-22 DIAGNOSIS — Y838 Other surgical procedures as the cause of abnormal reaction of the patient, or of later complication, without mention of misadventure at the time of the procedure: Secondary | ICD-10-CM | POA: Diagnosis present

## 2018-02-22 DIAGNOSIS — Z79899 Other long term (current) drug therapy: Secondary | ICD-10-CM | POA: Diagnosis not present

## 2018-02-22 DIAGNOSIS — Z79891 Long term (current) use of opiate analgesic: Secondary | ICD-10-CM

## 2018-02-22 DIAGNOSIS — M48061 Spinal stenosis, lumbar region without neurogenic claudication: Secondary | ICD-10-CM | POA: Diagnosis present

## 2018-02-22 DIAGNOSIS — Z9089 Acquired absence of other organs: Secondary | ICD-10-CM

## 2018-02-22 DIAGNOSIS — Z8249 Family history of ischemic heart disease and other diseases of the circulatory system: Secondary | ICD-10-CM | POA: Diagnosis not present

## 2018-02-22 DIAGNOSIS — Z87891 Personal history of nicotine dependence: Secondary | ICD-10-CM

## 2018-02-22 DIAGNOSIS — M5410 Radiculopathy, site unspecified: Secondary | ICD-10-CM | POA: Diagnosis present

## 2018-02-22 DIAGNOSIS — M2578 Osteophyte, vertebrae: Secondary | ICD-10-CM | POA: Diagnosis not present

## 2018-02-22 DIAGNOSIS — Z7951 Long term (current) use of inhaled steroids: Secondary | ICD-10-CM | POA: Diagnosis not present

## 2018-02-22 DIAGNOSIS — Z419 Encounter for procedure for purposes other than remedying health state, unspecified: Secondary | ICD-10-CM

## 2018-02-22 DIAGNOSIS — Z83511 Family history of glaucoma: Secondary | ICD-10-CM | POA: Diagnosis not present

## 2018-02-22 DIAGNOSIS — M5116 Intervertebral disc disorders with radiculopathy, lumbar region: Secondary | ICD-10-CM | POA: Diagnosis not present

## 2018-02-22 DIAGNOSIS — Z0389 Encounter for observation for other suspected diseases and conditions ruled out: Secondary | ICD-10-CM | POA: Diagnosis not present

## 2018-02-22 DIAGNOSIS — I10 Essential (primary) hypertension: Secondary | ICD-10-CM | POA: Diagnosis present

## 2018-02-22 DIAGNOSIS — T84498A Other mechanical complication of other internal orthopedic devices, implants and grafts, initial encounter: Secondary | ICD-10-CM | POA: Diagnosis not present

## 2018-02-22 DIAGNOSIS — Z981 Arthrodesis status: Secondary | ICD-10-CM

## 2018-02-22 DIAGNOSIS — S32059K Unspecified fracture of fifth lumbar vertebra, subsequent encounter for fracture with nonunion: Secondary | ICD-10-CM | POA: Diagnosis not present

## 2018-02-22 DIAGNOSIS — S32049K Unspecified fracture of fourth lumbar vertebra, subsequent encounter for fracture with nonunion: Secondary | ICD-10-CM | POA: Diagnosis not present

## 2018-02-22 DIAGNOSIS — M5417 Radiculopathy, lumbosacral region: Secondary | ICD-10-CM | POA: Diagnosis not present

## 2018-02-22 DIAGNOSIS — M4326 Fusion of spine, lumbar region: Secondary | ICD-10-CM | POA: Diagnosis not present

## 2018-02-22 DIAGNOSIS — T84226A Displacement of internal fixation device of vertebrae, initial encounter: Secondary | ICD-10-CM | POA: Diagnosis not present

## 2018-02-22 DIAGNOSIS — S32039K Unspecified fracture of third lumbar vertebra, subsequent encounter for fracture with nonunion: Secondary | ICD-10-CM | POA: Diagnosis not present

## 2018-02-22 HISTORY — PX: ANTERIOR LUMBAR FUSION: SHX1170

## 2018-02-22 HISTORY — PX: ABDOMINAL EXPOSURE: SHX5708

## 2018-02-22 LAB — POCT I-STAT EG7
Acid-base deficit: 2 mmol/L (ref 0.0–2.0)
BICARBONATE: 24.9 mmol/L (ref 20.0–28.0)
Calcium, Ion: 1.21 mmol/L (ref 1.15–1.40)
HCT: 42 % (ref 39.0–52.0)
Hemoglobin: 14.3 g/dL (ref 13.0–17.0)
O2 Saturation: 95 %
Patient temperature: 36.9
Potassium: 4.6 mmol/L (ref 3.5–5.1)
Sodium: 137 mmol/L (ref 135–145)
TCO2: 26 mmol/L (ref 22–32)
pCO2, Ven: 48 mmHg (ref 44.0–60.0)
pH, Ven: 7.322 (ref 7.250–7.430)
pO2, Ven: 82 mmHg — ABNORMAL HIGH (ref 32.0–45.0)

## 2018-02-22 LAB — PREPARE RBC (CROSSMATCH)

## 2018-02-22 SURGERY — ANTERIOR LUMBAR FUSION 2 LEVELS
Anesthesia: General

## 2018-02-22 MED ORDER — MIDAZOLAM HCL 5 MG/5ML IJ SOLN
INTRAMUSCULAR | Status: DC | PRN
  Administered 2018-02-22: 2 mg via INTRAVENOUS

## 2018-02-22 MED ORDER — SODIUM CHLORIDE 0.9 % IV SOLN
INTRAVENOUS | Status: AC
  Filled 2018-02-22: qty 1.2

## 2018-02-22 MED ORDER — ACETAMINOPHEN 10 MG/ML IV SOLN
INTRAVENOUS | Status: AC
  Filled 2018-02-22: qty 100

## 2018-02-22 MED ORDER — CALCIUM CARBONATE 1250 (500 CA) MG PO TABS
1250.0000 mg | ORAL_TABLET | Freq: Every day | ORAL | Status: DC
  Administered 2018-02-24 – 2018-02-25 (×2): 1250 mg via ORAL
  Filled 2018-02-22 (×2): qty 1

## 2018-02-22 MED ORDER — BUPIVACAINE-EPINEPHRINE (PF) 0.25% -1:200000 IJ SOLN
INTRAMUSCULAR | Status: AC
  Filled 2018-02-22: qty 30

## 2018-02-22 MED ORDER — SUGAMMADEX SODIUM 200 MG/2ML IV SOLN
INTRAVENOUS | Status: DC | PRN
  Administered 2018-02-22: 200 mg via INTRAVENOUS

## 2018-02-22 MED ORDER — DEXMEDETOMIDINE HCL 200 MCG/2ML IV SOLN
INTRAVENOUS | Status: DC | PRN
  Administered 2018-02-22 (×2): 8 ug via INTRAVENOUS
  Administered 2018-02-22 (×2): 4 ug via INTRAVENOUS
  Administered 2018-02-22 (×2): 8 ug via INTRAVENOUS
  Administered 2018-02-22 (×2): 4 ug via INTRAVENOUS

## 2018-02-22 MED ORDER — ALBUMIN HUMAN 5 % IV SOLN
INTRAVENOUS | Status: DC | PRN
  Administered 2018-02-22 (×3): via INTRAVENOUS

## 2018-02-22 MED ORDER — DEXMEDETOMIDINE HCL IN NACL 200 MCG/50ML IV SOLN
INTRAVENOUS | Status: AC
  Filled 2018-02-22: qty 50

## 2018-02-22 MED ORDER — SENNOSIDES-DOCUSATE SODIUM 8.6-50 MG PO TABS: 1.0000 | ORAL_TABLET | Freq: Every evening | ORAL | Status: DC | PRN

## 2018-02-22 MED ORDER — KETAMINE HCL 50 MG/5ML IJ SOSY
PREFILLED_SYRINGE | INTRAMUSCULAR | Status: AC
  Filled 2018-02-22: qty 5

## 2018-02-22 MED ORDER — LOSARTAN POTASSIUM-HCTZ 100-12.5 MG PO TABS: 1.0000 | ORAL_TABLET | Freq: Every day | ORAL | Status: DC

## 2018-02-22 MED ORDER — ACETAMINOPHEN 325 MG PO TABS
650.0000 mg | ORAL_TABLET | ORAL | Status: DC | PRN
  Administered 2018-02-23 – 2018-02-25 (×2): 650 mg via ORAL
  Filled 2018-02-22 (×2): qty 2

## 2018-02-22 MED ORDER — DOCUSATE SODIUM 100 MG PO CAPS
100.0000 mg | ORAL_CAPSULE | Freq: Two times a day (BID) | ORAL | Status: DC
  Administered 2018-02-22 – 2018-02-25 (×5): 100 mg via ORAL
  Filled 2018-02-22 (×5): qty 1

## 2018-02-22 MED ORDER — FENTANYL CITRATE (PF) 100 MCG/2ML IJ SOLN
INTRAMUSCULAR | Status: DC | PRN
  Administered 2018-02-22: 50 ug via INTRAVENOUS
  Administered 2018-02-22: 150 ug via INTRAVENOUS
  Administered 2018-02-22: 50 ug via INTRAVENOUS
  Administered 2018-02-22: 25 ug via INTRAVENOUS
  Administered 2018-02-22: 50 ug via INTRAVENOUS
  Administered 2018-02-22 (×3): 25 ug via INTRAVENOUS
  Administered 2018-02-22 (×2): 50 ug via INTRAVENOUS

## 2018-02-22 MED ORDER — OXYCODONE-ACETAMINOPHEN 5-325 MG PO TABS: 1.0000 | ORAL_TABLET | ORAL | 0 refills | Status: AC | PRN

## 2018-02-22 MED ORDER — ROCURONIUM BROMIDE 50 MG/5ML IV SOSY
PREFILLED_SYRINGE | INTRAVENOUS | Status: AC
  Filled 2018-02-22: qty 5

## 2018-02-22 MED ORDER — HYDROMORPHONE HCL 1 MG/ML IJ SOLN
INTRAMUSCULAR | Status: AC
  Administered 2018-02-22: 0.5 mg via INTRAVENOUS
  Filled 2018-02-22: qty 1

## 2018-02-22 MED ORDER — CHLORHEXIDINE GLUCONATE 4 % EX LIQD: 60.0000 mL | Freq: Once | CUTANEOUS | Status: DC

## 2018-02-22 MED ORDER — DIAZEPAM 5 MG PO TABS
ORAL_TABLET | ORAL | Status: AC
  Filled 2018-02-22: qty 1

## 2018-02-22 MED ORDER — POVIDONE-IODINE 7.5 % EX SOLN
Freq: Once | CUTANEOUS | Status: DC
  Filled 2018-02-22: qty 118

## 2018-02-22 MED ORDER — SODIUM CHLORIDE 0.9% FLUSH
3.0000 mL | Freq: Two times a day (BID) | INTRAVENOUS | Status: DC
  Administered 2018-02-22 – 2018-02-24 (×4): 3 mL via INTRAVENOUS

## 2018-02-22 MED ORDER — PREGABALIN 75 MG PO CAPS
75.0000 mg | ORAL_CAPSULE | Freq: Two times a day (BID) | ORAL | Status: DC
  Administered 2018-02-22 – 2018-02-25 (×5): 75 mg via ORAL
  Filled 2018-02-22 (×5): qty 1

## 2018-02-22 MED ORDER — ROCURONIUM BROMIDE 10 MG/ML (PF) SYRINGE
PREFILLED_SYRINGE | INTRAVENOUS | Status: DC | PRN
  Administered 2018-02-22: 30 mg via INTRAVENOUS
  Administered 2018-02-22: 20 mg via INTRAVENOUS
  Administered 2018-02-22: 50 mg via INTRAVENOUS
  Administered 2018-02-22: 20 mg via INTRAVENOUS
  Administered 2018-02-22: 65 mg via INTRAVENOUS
  Administered 2018-02-22: 20 mg via INTRAVENOUS
  Administered 2018-02-22: 10 mg via INTRAVENOUS
  Administered 2018-02-22: 35 mg via INTRAVENOUS
  Administered 2018-02-22: 20 mg via INTRAVENOUS
  Administered 2018-02-22 (×2): 10 mg via INTRAVENOUS
  Administered 2018-02-22: 20 mg via INTRAVENOUS
  Administered 2018-02-22: 10 mg via INTRAVENOUS

## 2018-02-22 MED ORDER — OXYCODONE-ACETAMINOPHEN 5-325 MG PO TABS
1.0000 | ORAL_TABLET | ORAL | Status: DC | PRN
  Administered 2018-02-22 – 2018-02-25 (×12): 2 via ORAL
  Filled 2018-02-22 (×11): qty 2

## 2018-02-22 MED ORDER — ROCURONIUM BROMIDE 50 MG/5ML IV SOSY
PREFILLED_SYRINGE | INTRAVENOUS | Status: AC
  Filled 2018-02-22: qty 10

## 2018-02-22 MED ORDER — PROPOFOL 10 MG/ML IV BOLUS
INTRAVENOUS | Status: AC
  Filled 2018-02-22: qty 20

## 2018-02-22 MED ORDER — HEMOSTATIC AGENTS (NO CHARGE) OPTIME
TOPICAL | Status: DC | PRN
  Administered 2018-02-22: 1 via TOPICAL

## 2018-02-22 MED ORDER — BACITRACIN ZINC 500 UNIT/GM EX OINT
TOPICAL_OINTMENT | CUTANEOUS | Status: AC
  Filled 2018-02-22: qty 28.35

## 2018-02-22 MED ORDER — BUPIVACAINE LIPOSOME 1.3 % IJ SUSP
20.0000 mL | INTRAMUSCULAR | Status: DC
  Filled 2018-02-22: qty 20

## 2018-02-22 MED ORDER — SODIUM CHLORIDE 0.9 % IV SOLN
10.0000 mL/h | Freq: Once | INTRAVENOUS | Status: AC
  Administered 2018-02-22: 10 mL/h via INTRAVENOUS

## 2018-02-22 MED ORDER — LIDOCAINE 2% (20 MG/ML) 5 ML SYRINGE
INTRAMUSCULAR | Status: DC | PRN
  Administered 2018-02-22: 100 mg via INTRAVENOUS

## 2018-02-22 MED ORDER — DSS 100 MG PO CAPS: 100.0000 mg | ORAL_CAPSULE | Freq: Three times a day (TID) | ORAL | Status: DC

## 2018-02-22 MED ORDER — THROMBIN 20000 UNITS EX SOLR
CUTANEOUS | Status: DC | PRN
  Administered 2018-02-22: 20000 [IU] via TOPICAL

## 2018-02-22 MED ORDER — ONDANSETRON HCL 4 MG/2ML IJ SOLN
4.0000 mg | Freq: Four times a day (QID) | INTRAMUSCULAR | Status: DC | PRN
  Filled 2018-02-22: qty 2

## 2018-02-22 MED ORDER — PHENOL 1.4 % MT LIQD: 1.0000 | OROMUCOSAL | Status: DC | PRN

## 2018-02-22 MED ORDER — LACTATED RINGERS IV SOLN
INTRAVENOUS | Status: DC | PRN
  Administered 2018-02-22 (×4): via INTRAVENOUS

## 2018-02-22 MED ORDER — MORPHINE SULFATE (PF) 2 MG/ML IV SOLN
1.0000 mg | INTRAVENOUS | Status: DC | PRN
  Administered 2018-02-23 – 2018-02-24 (×6): 2 mg via INTRAVENOUS
  Filled 2018-02-22 (×6): qty 1

## 2018-02-22 MED ORDER — MENTHOL 3 MG MT LOZG: 1.0000 | LOZENGE | OROMUCOSAL | Status: DC | PRN

## 2018-02-22 MED ORDER — FENTANYL CITRATE (PF) 250 MCG/5ML IJ SOLN
INTRAMUSCULAR | Status: AC
  Filled 2018-02-22: qty 5

## 2018-02-22 MED ORDER — THROMBIN (RECOMBINANT) 20000 UNITS EX SOLR
CUTANEOUS | Status: AC
  Filled 2018-02-22: qty 20000

## 2018-02-22 MED ORDER — DEXAMETHASONE SODIUM PHOSPHATE 10 MG/ML IJ SOLN
INTRAMUSCULAR | Status: AC
  Filled 2018-02-22: qty 1

## 2018-02-22 MED ORDER — CEFAZOLIN SODIUM-DEXTROSE 2-4 GM/100ML-% IV SOLN
2.0000 g | Freq: Three times a day (TID) | INTRAVENOUS | Status: AC
  Administered 2018-02-22 – 2018-02-23 (×2): 2 g via INTRAVENOUS
  Filled 2018-02-22 (×3): qty 100

## 2018-02-22 MED ORDER — POTASSIUM CHLORIDE IN NACL 20-0.9 MEQ/L-% IV SOLN
INTRAVENOUS | Status: DC
  Administered 2018-02-23: 20:00:00 via INTRAVENOUS
  Filled 2018-02-22: qty 1000

## 2018-02-22 MED ORDER — ONDANSETRON HCL 4 MG/2ML IJ SOLN: 4.0000 mg | Freq: Once | INTRAMUSCULAR | Status: DC | PRN

## 2018-02-22 MED ORDER — FLEET ENEMA 7-19 GM/118ML RE ENEM: 1.0000 | ENEMA | Freq: Once | RECTAL | Status: DC | PRN

## 2018-02-22 MED ORDER — VITAMIN D 25 MCG (1000 UNIT) PO TABS
1000.0000 [IU] | ORAL_TABLET | Freq: Every day | ORAL | Status: DC
  Administered 2018-02-24 – 2018-02-25 (×2): 1000 [IU] via ORAL
  Filled 2018-02-22 (×2): qty 1

## 2018-02-22 MED ORDER — 0.9 % SODIUM CHLORIDE (POUR BTL) OPTIME
TOPICAL | Status: DC | PRN
  Administered 2018-02-22 (×5): 1000 mL

## 2018-02-22 MED ORDER — ZOLPIDEM TARTRATE 5 MG PO TABS
5.0000 mg | ORAL_TABLET | Freq: Every evening | ORAL | Status: DC | PRN
  Administered 2018-02-23 – 2018-02-24 (×2): 5 mg via ORAL
  Filled 2018-02-22 (×2): qty 1

## 2018-02-22 MED ORDER — SODIUM CHLORIDE 0.9 % IV SOLN
INTRAVENOUS | Status: DC | PRN
  Administered 2018-02-22 (×2): 30 ug/min via INTRAVENOUS

## 2018-02-22 MED ORDER — DIAZEPAM 5 MG PO TABS
5.0000 mg | ORAL_TABLET | Freq: Four times a day (QID) | ORAL | Status: DC | PRN
  Administered 2018-02-22 – 2018-02-25 (×4): 5 mg via ORAL
  Filled 2018-02-22 (×3): qty 1

## 2018-02-22 MED ORDER — ONDANSETRON HCL 4 MG/2ML IJ SOLN
INTRAMUSCULAR | Status: DC | PRN
  Administered 2018-02-22: 4 mg via INTRAVENOUS

## 2018-02-22 MED ORDER — BUPIVACAINE-EPINEPHRINE (PF) 0.25% -1:200000 IJ SOLN
INTRAMUSCULAR | Status: DC | PRN
  Administered 2018-02-22: 10 mL via PERINEURAL

## 2018-02-22 MED ORDER — DEXAMETHASONE SODIUM PHOSPHATE 10 MG/ML IJ SOLN
INTRAMUSCULAR | Status: DC | PRN
  Administered 2018-02-22: 10 mg via INTRAVENOUS

## 2018-02-22 MED ORDER — FLUTICASONE PROPIONATE 50 MCG/ACT NA SUSP: 2.0000 | Freq: Every day | NASAL | Status: DC | PRN

## 2018-02-22 MED ORDER — MEPERIDINE HCL 50 MG/ML IJ SOLN: 6.2500 mg | INTRAMUSCULAR | Status: DC | PRN

## 2018-02-22 MED ORDER — BISACODYL 5 MG PO TBEC
5.0000 mg | DELAYED_RELEASE_TABLET | Freq: Every day | ORAL | Status: DC | PRN
  Administered 2018-02-24: 5 mg via ORAL
  Filled 2018-02-22: qty 1

## 2018-02-22 MED ORDER — ALUM & MAG HYDROXIDE-SIMETH 200-200-20 MG/5ML PO SUSP: 30.0000 mL | Freq: Four times a day (QID) | ORAL | Status: DC | PRN

## 2018-02-22 MED ORDER — ONDANSETRON HCL 4 MG/2ML IJ SOLN
INTRAMUSCULAR | Status: AC
  Filled 2018-02-22: qty 2

## 2018-02-22 MED ORDER — MIDAZOLAM HCL 2 MG/2ML IJ SOLN
INTRAMUSCULAR | Status: AC
  Filled 2018-02-22: qty 2

## 2018-02-22 MED ORDER — ACETAMINOPHEN 650 MG RE SUPP: 650.0000 mg | RECTAL | Status: DC | PRN

## 2018-02-22 MED ORDER — ONDANSETRON HCL 4 MG PO TABS: 4.0000 mg | ORAL_TABLET | Freq: Four times a day (QID) | ORAL | Status: DC | PRN

## 2018-02-22 MED ORDER — PROPOFOL 10 MG/ML IV BOLUS
INTRAVENOUS | Status: DC | PRN
  Administered 2018-02-22: 170 mg via INTRAVENOUS
  Administered 2018-02-22: 30 mg via INTRAVENOUS

## 2018-02-22 MED ORDER — SODIUM CHLORIDE 0.9% FLUSH: 3.0000 mL | INTRAVENOUS | Status: DC | PRN

## 2018-02-22 MED ORDER — ACETAMINOPHEN 10 MG/ML IV SOLN
INTRAVENOUS | Status: DC | PRN
  Administered 2018-02-22: 1000 mg via INTRAVENOUS

## 2018-02-22 MED ORDER — CEFAZOLIN SODIUM-DEXTROSE 2-4 GM/100ML-% IV SOLN
2.0000 g | INTRAVENOUS | Status: AC
  Administered 2018-02-22 (×2): 2 g via INTRAVENOUS
  Filled 2018-02-22: qty 100

## 2018-02-22 MED ORDER — SODIUM CHLORIDE 0.9 % IV SOLN: 250.0000 mL | INTRAVENOUS | Status: DC

## 2018-02-22 MED ORDER — HYDROMORPHONE HCL 1 MG/ML IJ SOLN
0.2500 mg | INTRAMUSCULAR | Status: DC | PRN
  Administered 2018-02-22 (×4): 0.5 mg via INTRAVENOUS

## 2018-02-22 MED ORDER — BACITRACIN ZINC 500 UNIT/GM EX OINT
TOPICAL_OINTMENT | CUTANEOUS | Status: DC | PRN
  Administered 2018-02-22: 1 via TOPICAL

## 2018-02-22 MED ORDER — LIDOCAINE 2% (20 MG/ML) 5 ML SYRINGE
INTRAMUSCULAR | Status: AC
  Filled 2018-02-22: qty 5

## 2018-02-22 MED ORDER — HYDROCHLOROTHIAZIDE 12.5 MG PO CAPS
12.5000 mg | ORAL_CAPSULE | Freq: Every day | ORAL | Status: DC
  Administered 2018-02-24 – 2018-02-25 (×2): 12.5 mg via ORAL
  Filled 2018-02-22 (×3): qty 1

## 2018-02-22 MED ORDER — LOSARTAN POTASSIUM 50 MG PO TABS
100.0000 mg | ORAL_TABLET | Freq: Every day | ORAL | Status: DC
  Administered 2018-02-24 – 2018-02-25 (×2): 100 mg via ORAL
  Filled 2018-02-22 (×2): qty 2

## 2018-02-22 MED ORDER — ESMOLOL HCL 100 MG/10ML IV SOLN
INTRAVENOUS | Status: DC | PRN
  Administered 2018-02-22 (×2): 10 mg via INTRAVENOUS
  Administered 2018-02-22: 30 mg via INTRAVENOUS
  Administered 2018-02-22: 20 mg via INTRAVENOUS
  Administered 2018-02-22 (×3): 10 mg via INTRAVENOUS

## 2018-02-22 MED ORDER — MINERAL OIL LIGHT 100 % EX OIL
TOPICAL_OIL | CUTANEOUS | Status: DC | PRN
  Administered 2018-02-22: 1 via TOPICAL

## 2018-02-22 MED ORDER — OXYCODONE-ACETAMINOPHEN 5-325 MG PO TABS
ORAL_TABLET | ORAL | Status: AC
  Administered 2018-02-22: 2 via ORAL
  Filled 2018-02-22: qty 2

## 2018-02-22 SURGICAL SUPPLY — 100 items
APPLIER CLIP 11 MED OPEN (CLIP) ×4
BENZOIN TINCTURE PRP APPL 2/3 (GAUZE/BANDAGES/DRESSINGS) ×2 IMPLANT
BLADE CLIPPER SURG (BLADE) ×2 IMPLANT
BLADE SURG 10 STRL SS (BLADE) ×2 IMPLANT
BONE VIVIGEN FORMABLE 10CC (Bone Implant) ×4 IMPLANT
CLIP APPLIE 11 MED OPEN (CLIP) ×2 IMPLANT
CLIP LIGATING EXTRA MED SLVR (CLIP) ×2 IMPLANT
CLIP LIGATING EXTRA SM BLUE (MISCELLANEOUS) ×2 IMPLANT
CORDS BIPOLAR (ELECTRODE) ×2 IMPLANT
COVER SURGICAL LIGHT HANDLE (MISCELLANEOUS) ×4 IMPLANT
COVER WAND RF STERILE (DRAPES) ×4 IMPLANT
DERMABOND ADVANCED (GAUZE/BANDAGES/DRESSINGS) ×1
DERMABOND ADVANCED .7 DNX12 (GAUZE/BANDAGES/DRESSINGS) ×1 IMPLANT
DRAPE C-ARM 42X72 X-RAY (DRAPES) ×4 IMPLANT
DRAPE POUCH INSTRU U-SHP 10X18 (DRAPES) ×2 IMPLANT
DRAPE SURG 17X23 STRL (DRAPES) ×16 IMPLANT
DRSG MEPILEX BORDER 4X12 (GAUZE/BANDAGES/DRESSINGS) ×2 IMPLANT
DURAPREP 26ML APPLICATOR (WOUND CARE) ×4 IMPLANT
ELECT BLADE 4.0 EZ CLEAN MEGAD (MISCELLANEOUS) ×4
ELECT CAUTERY BLADE 6.4 (BLADE) ×4 IMPLANT
ELECT REM PT RETURN 9FT ADLT (ELECTROSURGICAL) ×2
ELECTRODE BLDE 4.0 EZ CLN MEGD (MISCELLANEOUS) ×2 IMPLANT
ELECTRODE REM PT RTRN 9FT ADLT (ELECTROSURGICAL) ×1 IMPLANT
FEE INTRAOP MONITOR IMPULS NCS (MISCELLANEOUS) ×1 IMPLANT
GAUZE 4X4 16PLY RFD (DISPOSABLE) ×2 IMPLANT
GAUZE SPONGE 4X4 12PLY STRL LF (GAUZE/BANDAGES/DRESSINGS) ×4 IMPLANT
GLOVE BIO SURGEON STRL SZ7 (GLOVE) ×6 IMPLANT
GLOVE BIO SURGEON STRL SZ7.5 (GLOVE) ×2 IMPLANT
GLOVE BIO SURGEON STRL SZ8 (GLOVE) ×6 IMPLANT
GLOVE BIOGEL PI IND STRL 7.0 (GLOVE) ×2 IMPLANT
GLOVE BIOGEL PI IND STRL 8 (GLOVE) ×2 IMPLANT
GLOVE BIOGEL PI INDICATOR 7.0 (GLOVE) ×2
GLOVE BIOGEL PI INDICATOR 8 (GLOVE) ×2
GLOVE SS BIOGEL STRL SZ 7.5 (GLOVE) ×1 IMPLANT
GLOVE SUPERSENSE BIOGEL SZ 7.5 (GLOVE) ×1
GOWN STRL REUS W/ TWL LRG LVL3 (GOWN DISPOSABLE) ×5 IMPLANT
GOWN STRL REUS W/ TWL XL LVL3 (GOWN DISPOSABLE) ×2 IMPLANT
GOWN STRL REUS W/TWL LRG LVL3 (GOWN DISPOSABLE) ×5
GOWN STRL REUS W/TWL XL LVL3 (GOWN DISPOSABLE) ×4
HEMOSTAT SURGICEL 2X14 (HEMOSTASIS) IMPLANT
INSERT FOGARTY 61MM (MISCELLANEOUS) IMPLANT
INSERT FOGARTY SM (MISCELLANEOUS) IMPLANT
INTRAOP MONITOR FEE IMPULS NCS (MISCELLANEOUS) ×1
INTRAOP MONITOR FEE IMPULSE (MISCELLANEOUS) ×1
IV CATH 14GX2 1/4 (CATHETERS) ×4 IMPLANT
KIT BASIN OR (CUSTOM PROCEDURE TRAY) ×2 IMPLANT
KIT MAXAN 1 LEVEL (KITS) ×2 IMPLANT
KIT TURNOVER KIT B (KITS) ×2 IMPLANT
LOOP VESSEL MAXI BLUE (MISCELLANEOUS) IMPLANT
LOOP VESSEL MINI RED (MISCELLANEOUS) IMPLANT
NEEDLE HYPO 25GX1X1/2 BEV (NEEDLE) ×2 IMPLANT
NEEDLE SPNL 18GX3.5 QUINCKE PK (NEEDLE) ×2 IMPLANT
NS IRRIG 1000ML POUR BTL (IV SOLUTION) ×12 IMPLANT
PACK LAMINECTOMY ORTHO (CUSTOM PROCEDURE TRAY) ×2 IMPLANT
PACK UNIVERSAL I (CUSTOM PROCEDURE TRAY) ×4 IMPLANT
PAD ARMBOARD 7.5X6 YLW CONV (MISCELLANEOUS) ×8 IMPLANT
PATTIES SURGICAL .5 X1 (DISPOSABLE) ×2 IMPLANT
PLATE AEGIS ANTI LUMBAR 19MM (Plate) ×2 IMPLANT
PLATE AEGIS LUMBAR 21MM (Plate) ×2 IMPLANT
SCREW AEGIS 24MM (Screw) ×16 IMPLANT
SPONGE INTESTINAL PEANUT (DISPOSABLE) ×10 IMPLANT
SPONGE LAP 18X18 RF (DISPOSABLE) ×2 IMPLANT
SPONGE LAP 18X18 X RAY DECT (DISPOSABLE) IMPLANT
SPONGE LAP 4X18 RFD (DISPOSABLE) ×2 IMPLANT
SPONGE SURGIFOAM ABS GEL 100 (HEMOSTASIS) ×2 IMPLANT
STAPLER VISISTAT 35W (STAPLE) ×2 IMPLANT
STRIP CLOSURE SKIN 1/2X4 (GAUZE/BANDAGES/DRESSINGS) ×2 IMPLANT
SURGIFLO W/THROMBIN 8M KIT (HEMOSTASIS) IMPLANT
SUT ETHILON 2 0 FS 18 (SUTURE) ×6 IMPLANT
SUT MNCRL AB 4-0 PS2 18 (SUTURE) ×2 IMPLANT
SUT PDS AB 1 CT  36 (SUTURE) ×2
SUT PDS AB 1 CT 36 (SUTURE) ×2 IMPLANT
SUT PDS AB 1 CTX 36 (SUTURE) ×4 IMPLANT
SUT PROLENE 4 0 RB 1 (SUTURE)
SUT PROLENE 4-0 RB1 .5 CRCL 36 (SUTURE) IMPLANT
SUT PROLENE 5 0 C 1 24 (SUTURE) IMPLANT
SUT PROLENE 5 0 C 1 36 (SUTURE) ×4 IMPLANT
SUT PROLENE 5 0 CC1 (SUTURE) IMPLANT
SUT PROLENE 6 0 C 1 30 (SUTURE) ×2 IMPLANT
SUT PROLENE 6 0 CC (SUTURE) IMPLANT
SUT SILK 0 TIES 10X30 (SUTURE) ×2 IMPLANT
SUT SILK 2 0 TIES 10X30 (SUTURE) ×4 IMPLANT
SUT SILK 2 0SH CR/8 30 (SUTURE) IMPLANT
SUT SILK 3 0 TIES 10X30 (SUTURE) ×4 IMPLANT
SUT SILK 3 0SH CR/8 30 (SUTURE) IMPLANT
SUT VIC AB 1 CT1 27 (SUTURE) ×2
SUT VIC AB 1 CT1 27XBRD ANBCTR (SUTURE) ×2 IMPLANT
SUT VIC AB 1 CTX 36 (SUTURE) ×2
SUT VIC AB 1 CTX36XBRD ANBCTR (SUTURE) ×2 IMPLANT
SUT VIC AB 2-0 CT2 18 VCP726D (SUTURE) ×2 IMPLANT
SYNCAGE EVOL MD 15X9.4 14D (Spacer) ×2 IMPLANT
SYNCAGE EVOL SM 12X10.4 6D (Spacer) ×2 IMPLANT
SYR BULB IRRIGATION 50ML (SYRINGE) ×2 IMPLANT
TAPE CLOTH SURG 6X10 WHT LF (GAUZE/BANDAGES/DRESSINGS) ×4 IMPLANT
TOWEL GREEN STERILE (TOWEL DISPOSABLE) ×2 IMPLANT
TOWEL OR 17X24 6PK STRL BLUE (TOWEL DISPOSABLE) ×2 IMPLANT
TOWEL OR 17X26 10 PK STRL BLUE (TOWEL DISPOSABLE) ×2 IMPLANT
TRAY FOLEY CATH SILVER 16FR (SET/KITS/TRAYS/PACK) ×2 IMPLANT
WATER STERILE IRR 1000ML POUR (IV SOLUTION) ×2 IMPLANT
YANKAUER SUCT BULB TIP NO VENT (SUCTIONS) ×2 IMPLANT

## 2018-02-22 NOTE — Op Note (Signed)
DATE OF PROCEDURE:  02/22/2018  PATIENT: Manuel Hood RECORD #: [id number]  DOB: 06/30/1950                              OPERATIVE REPORT   PREOPERATIVE DIAGNOSES: 1. Nonunion, L3/4, L4/5 2. Right lumbar radiculopathy  POSTOPERATIVE DIAGNOSES: 1. Nonunion, L3/4, L4/5 2. Right lumbar radiculopathy  PROCEDURE: 1. Posterior removal of previously-placed instrumentation (L3, L4 and L5 caps and interconnecting rods bilaterally) 2. Anterior lumbar interbody fusion, L3/4, L4/5 (expsure peformed by Dr. Sherren Mocha Early) 3. Insertion of interbody device x2 (26mm, medium, 14 degrees of lordosis at L4/5, 42mm, small, 6 degrees of lordosis at L3/4)). 4. Placement of anterior instrumentation, L3/4, L4/5 (Aegis plate and screws) 5. Intraoperative use of fluoroscopy. 6. Use of morselized allograft - Vivigen 7. 1st assistant to Dr. Sherren Mocha Early for retroperitoneal exposure  SURGEON:  Phylliss Bob, MD  ASSISTANT:  Pricilla Holm, PA-C  ANESTHESIA:  General endotracheal anesthesia.  COMPLICATIONS:  None.  DISPOSITION:  Stable.  ESTIMATED BLOOD LOSS:  1600, with 800 cc retransfused via Cell Saver  INDICATIONS FOR SURGERY:  Briefly, [name] is a very pleasant 68 year old male, who has had a previous lumbar decompression by another provider, and subsequently did have recurrent stenosis, requiring a revision decompression and fusion procedure at L3/4 and L4/5. Patient initially did well, but did go on to have significant back pain and right leg pain pain. Advanced imaging did reveal a nonunion and both levels in addition to neuroforaminal stenosis on the right due due segmental collapse. Given the patient's ongoing pain and dysfunction, we did discuss proceeding with the procedure noted above.  The patient was fully aware of the risks and limitations associated with surgery, and did wish to proceed.  The plan was to proceed with stage 1 of his procedure today, and to return tomorrow for stage 2,  specifically, a posterior fusion with revision instrumentation.    OPERATIVE DETAILS:  On 02/22/2018, the patient was brought to surgery and general endotracheal anesthesia was administered.  The patient was placed prone on a spinal frame.   The back was prepped and draped in the usual sterile fashion.  A midline incision was made, in line with the patient's previous incision.  The fascia was incised at the midline, and the bilateral pedicle screws were identified.  All 6 caps were removed, as were the bilateral interconnecting rods.  At this point, the wound was copiously irrigated with a liter of normal saline.  The wound was then closed using #1 PDS, followed by 2-0 nylon.  Bacitracin was then applied followed by sterile dressing.    At this point, the patient was rolled supine onto a hospital bed. The patient's abdomen was prepped and draped in the usual sterile fashion.  An anterior retroperitoneal approach was then performed by Dr. Curt Jews.  I did function as his first assistant during the approach.  Once the anterior lumbar spine was noted, we did focus our attention on the L4-5 intervertebral space.   Once the retractor was appropriately positioned, I performed an anterior annulotomy.  Dissection was carried back to the previously placed intervertebral implant.  The implant was extremely difficult to remove.  It was clearly subsided into the vertebral body both above and below.  I did have to liberally use curettes and osteotomes in order to remove the implant in its entirety.  I then completed the discectomy to the level  of the posterior longitudinal ligament.  The endplates were appropriately prepared.  The appropriate sized intervertebral implant was then liberally packed with Vivigen and tamped into position in the usual fashion.  At this point, a 21 mm plate was placed across the L4-5 intervertebral space.  24 mm screws were placed, 2 in each vertebral body at L4 and L5.  The screws were  then locked to the plate.  At this point, Dr. Donnetta Hutching scrubbed back into the case and reposition the retractor, to be centered over the L3-4 intervertebral space.  Of note, the angle of the intervertebral disc was angled much more inferiorly than the angle that the retractor was able to be positioned.  This did make the discectomy extremely meticulous, as it was extremely difficult to visualize the upper endplate of L4.  I was however able to to perform a discectomy, and the previously placed intervertebral implant was noted.  Once again, removing it was extremely difficult, as it was subsided into the vertebral body both above and below it.  I was ultimately able to remove the implant, but was extremely meticulous and extremely time-consuming.  Once the implant was removed, the discectomy was completed to the posterior annulus.  The endplates were prepared, and the appropriate size intervertebral spacer was packed with vision and tamped into position in the usual fashion.  I then proceeded with placement of anterior instrumentation.  Specifically, a 19 mm anterior plate was placed across the L3-4 intervertebral space.  24 mm screws were advanced through the plate into the L3 and L4 vertebral bodies.  The screws were then locked to the plate.  The wound was then copiously irrigated.  I was very pleased with the final AP and lateral fluoroscopic images.  At this point, I proceeded with removing the retractor blades.  Upon removal of the right lateral blade, there was noted to be bleeding, which did appear to be venous.  Dr. Donnetta Hutching was paged at this point, I did enter the room, and did control the bleeding, and did repair a small hole in a vein.  This did entirely control the bleeding.  He then proceeded with closing the fascia, subcutaneous tissue, and skin.  Of note, Pricilla Holm was my assistant throughout surgery, and did aid in retraction, suctioning, and closure for both the anterior and posterior portions  of the procedure.  The plan at this point, is to bring the patient back for surgery for stage 2, of what is currently planned as a 2 staged procedure. Specifically, this will involve a revision of his previously placed posterior instrumentation, with a posterior fusion.  Phylliss Bob, MD

## 2018-02-22 NOTE — OR Nursing (Signed)
No foreign bodies per Dr. Golden Circle, Radiologist.

## 2018-02-22 NOTE — Anesthesia Postprocedure Evaluation (Signed)
Anesthesia Post Note  Patient: TSUGIO ELISON  Procedure(s) Performed: POSTERIOR REMOVAL OF INSTRUMENTATION . ANTERIOR LUMBAR INTERBODY FUSION LUMBAR 3-4, LUMBAR 4-5 WITH INSTRUMENTATION AND ALLOGRAFT (N/A ) ABDOMINAL EXPOSURE (N/A )     Patient location during evaluation: PACU Anesthesia Type: General Level of consciousness: awake and alert and oriented Pain management: pain level controlled Vital Signs Assessment: post-procedure vital signs reviewed and stable Respiratory status: spontaneous breathing, nonlabored ventilation, respiratory function stable and patient connected to nasal cannula oxygen Cardiovascular status: blood pressure returned to baseline and stable Postop Assessment: no apparent nausea or vomiting Anesthetic complications: no    Last Vitals:  Vitals:   02/22/18 1545 02/22/18 1600  BP: 128/82 (!) 133/91  Pulse: 97 98  Resp: (P) 20 15  Temp: (P) 36.5 C   SpO2: 95% 94%    Last Pain:  Vitals:   02/22/18 1545  TempSrc:   PainSc: (P) 10-Worst pain ever                 Wiley Flicker A.

## 2018-02-22 NOTE — Transfer of Care (Signed)
Immediate Anesthesia Transfer of Care Note  Patient: Manuel Hood  Procedure(s) Performed: POSTERIOR REMOVAL OF INSTRUMENTATION . ANTERIOR LUMBAR INTERBODY FUSION LUMBAR 3-4, LUMBAR 4-5 WITH INSTRUMENTATION AND ALLOGRAFT (N/A ) ABDOMINAL EXPOSURE (N/A )  Patient Location: PACU  Anesthesia Type:General  Level of Consciousness: awake, alert  and oriented  Airway & Oxygen Therapy: Patient Spontanous Breathing  Post-op Assessment: Report given to RN and Post -op Vital signs reviewed and stable  Post vital signs: Reviewed and stable  Last Vitals:  Vitals Value Taken Time  BP 128/82 02/22/2018  3:44 PM  Temp    Pulse 97 02/22/2018  3:48 PM  Resp 25 02/22/2018  3:48 PM  SpO2 94 % 02/22/2018  3:48 PM  Vitals shown include unvalidated device data.  Last Pain:  Vitals:   02/22/18 0640  TempSrc:   PainSc: 7          Complications: No apparent anesthesia complications

## 2018-02-22 NOTE — Anesthesia Procedure Notes (Signed)
Procedure Name: Intubation Performed by: Milford Cage, CRNA Pre-anesthesia Checklist: Patient identified, Emergency Drugs available, Suction available and Patient being monitored Patient Re-evaluated:Patient Re-evaluated prior to induction Oxygen Delivery Method: Circle System Utilized Preoxygenation: Pre-oxygenation with 100% oxygen Induction Type: IV induction Ventilation: Mask ventilation without difficulty Laryngoscope Size: Mac and 4 Grade View: Grade III Tube type: Oral Tube size: 7.5 mm Number of attempts: 1 Airway Equipment and Method: Stylet and Oral airway Placement Confirmation: ETT inserted through vocal cords under direct vision,  positive ETCO2 and breath sounds checked- equal and bilateral Secured at: 24 cm Tube secured with: Tape Dental Injury: Teeth and Oropharynx as per pre-operative assessment

## 2018-02-22 NOTE — H&P (Signed)
PREOPERATIVE H&P  Chief Complaint: Low back pain, right leg pain  HPI: Manuel Hood is a 68 y.o. male who presents with ongoing pain in the right leg and low back  MRI reveals a nonunion at L3/4 and L4/5  Patient has failed multiple forms of conservative care and continues to have pain (see office notes for additional details regarding the patient's full course of treatment)  Past Medical History:  Diagnosis Date  . Arthritis   . GERD (gastroesophageal reflux disease)    rare  . Hypertension   . Low back pain   . Right leg pain   . Seasonal allergies   . Sinus pain    Past Surgical History:  Procedure Laterality Date  . APPENDECTOMY    . BACK SURGERY     x2 (cervical and lumbar)  . COLONOSCOPY W/ POLYPECTOMY    . JOINT REPLACEMENT     Right-full Left- partial  . KNEE ARTHROSCOPY  rt knee x3  . KNEE ARTHROSCOPY WITH MEDIAL MENISECTOMY Left 06/29/2012   Procedure: LEFT KNEE ARTHROSCOPY WITH DEBRIDEMENT AND MENISECTOMY;  Surgeon: Mauri Pole, MD;  Location: [facility];  Service: Orthopedics;  Laterality: Left;  . ROTATOR CUFF REPAIR Right rt shoulder  . TONSILLECTOMY    . TOTAL KNEE ARTHROPLASTY  02/04/2011   Procedure: TOTAL KNEE ARTHROPLASTY;  Surgeon: Johnn Hai;  Location: [facility];  Service: Orthopedics;  Laterality: Right;  . TOTAL KNEE REVISION Right 06/29/2012   Procedure: REVISION RIGHT TOTAL KNEE/REVISION FEMORAL COMPONENT POLY EXCHANGE/PATELLA LATERAL FACETECTOMY;  Surgeon: Mauri Pole, MD;  Location: [facility];  Service: Orthopedics;  Laterality: Right;   Social History   Socioeconomic History  . Marital status: Married    Spouse name: Not on file  . Number of children: Not on file  . Years of education: Not on file  . Highest education level: Not on file  Occupational History  . Not on file  Social Needs  . Financial resource strain: Not on file  . Food insecurity:    Worry: Not on file    Inability: Not on file  . Transportation needs:   Medical: Not on file    Non-medical: Not on file  Tobacco Use  . Smoking status: Former Smoker    Years: 22.00    Types: Cigarettes    Last attempt to quit: 02/02/1996    Years since quitting: 22.0  . Smokeless tobacco: Never Used  Substance and Sexual Activity  . Alcohol use: Not Currently  . Drug use: No  . Sexual activity: Not on file  Lifestyle  . Physical activity:    Days per week: Not on file    Minutes per session: Not on file  . Stress: Not on file  Relationships  . Social connections:    Talks on phone: Not on file    Gets together: Not on file    Attends religious service: Not on file    Active member of club or organization: Not on file    Attends meetings of clubs or organizations: Not on file    Relationship status: Not on file  Other Topics Concern  . Not on file  Social History Narrative  . Not on file   Family History  Problem Relation Age of Onset  . High blood pressure Mother   . Glaucoma Mother    No Known Allergies Prior to Admission medications   Medication Sig Start Date End Date Taking? Authorizing Provider  acetaminophen-codeine (TYLENOL #3) 300-30 MG tablet Take 1-2 tablets by mouth every 6 (six) hours as needed for moderate pain (not taking- does'nt help).    Yes [provider]  calcium carbonate (OS-CAL) 600 MG TABS Take 600 mg by mouth daily.     Yes [provider]  Cholecalciferol (D3-1000) 1000 units tablet Take 1,000 Units by mouth daily.   Yes [provider]  cyclobenzaprine (FLEXERIL) 5 MG tablet Take 5 mg by mouth at bedtime.   Yes [provider]  docusate sodium 100 MG CAPS Take 100 mg by mouth 2 (two) times daily. Patient taking differently: Take 100 mg by mouth 3 (three) times daily.  06/30/12  Yes Babish, Rodman Key, PA-C  fluticasone (FLONASE) 50 MCG/ACT nasal spray Place 2 sprays into both nostrils daily as needed for allergies or rhinitis.    Yes [provider]    HYDROcodone-acetaminophen (NORCO/VICODIN) 5-325 MG tablet Take 1-2 tablets by mouth 3 (three) times daily as needed for moderate pain.    Yes [provider]  losartan-hydrochlorothiazide (HYZAAR) 100-12.5 MG tablet Take 1 tablet by mouth daily.   Yes [provider]  meloxicam (MOBIC) 15 MG tablet Take 15 mg by mouth at bedtime as needed for pain.    Yes [provider]  methocarbamol (ROBAXIN) 750 MG tablet Take 750 mg by mouth daily.   Yes [provider]  Multiple Vitamins-Minerals (ONE-A-DAY 50 PLUS PO) Take 1 tablet by mouth daily.     Yes [provider]  pregabalin (LYRICA) 75 MG capsule Take 75 mg by mouth 2 (two) times daily.   Yes [provider]  vitamin C (ASCORBIC ACID) 500 MG tablet Take 500 mg by mouth daily.   Yes [provider]     All other systems have been reviewed and were otherwise negative with the exception of those mentioned in the HPI and as above.  Physical Exam: Vitals:   02/22/18 0546  BP: 132/82  Pulse: 79  Resp: 18  Temp: 97.7 F (36.5 C)  SpO2: 98%    Body mass index is 31.01 kg/m.  General: Alert, no acute distress Cardiovascular: No pedal edema Respiratory: No cyanosis, no use of accessory musculature Skin: No lesions in the area of chief complaint Neurologic: Sensation intact distally Psychiatric: Patient is competent for consent with normal mood and affect Lymphatic: No axillary or cervical lymphadenopathy  MUSCULOSKELETAL: + SLR on the right  Assessment/Plan: L3-4, L4-5 nonunion Plan for Procedure(s): POSTERIOR REMOVAL OF INSTRUMENTATION . ANTERIOR LUMBAR INTERBODY FUSION LUMBAR 3-4, LUMBAR 4-5 WITH INSTRUMENTATION AND ALLOGRAFT - TO BE FOLLOWED BY A POSTERIOR FUSION WITH INSTRUMENTATION AT STAGE 2 OF THE PROCEDURE.    Norva Karvonen, MD 02/22/2018 7:08 AM

## 2018-02-22 NOTE — OR Nursing (Signed)
Family notified of patient status at 1300.

## 2018-02-22 NOTE — Progress Notes (Signed)
Spoke with PA Kayla about no 4NP beds and not a candidate for 3C. Asked if 3W was ok. PA stated that 3w was fine.

## 2018-02-22 NOTE — Op Note (Signed)
OPERATIVE REPORT  DATE OF SURGERY: 02/22/2018  PATIENT: Manuel Hood, 68 y.o. male MRN: [id number]  DOB: 25-Nov-1950  PRE-OPERATIVE DIAGNOSIS: Degenerative disc disease  POST-OPERATIVE DIAGNOSIS:  Same  PROCEDURE: Anterior exposure for L3-4 and L4-5 interbody fusion  SURGEON:  Curt Jews, M.D.  Co-surgeon with Dr. Lynann Bologna  Assistant Arlee Muslim, PA-C  ANESTHESIA: General  EBL: per anesthesia record  Total I/O In: 0160 [I.V.:3000; Blood:800; IV Piggyback:750] Out: 2200 [Urine:400; Blood:1800]  BLOOD ADMINISTERED: none  DRAINS: none  SPECIMEN: none  COUNTS CORRECT:  YES  PATIENT DISPOSITION:  PACU - hemodynamically stable  PROCEDURE DETAILS: Patient initially had posterior removal of instrumentation with Dr. Lynann Bologna in the prone position.  He was then placed in a supine position.  C-arm was brought into the field and the level of the 3 4 and 4 5 disc were identified and marked on the surface of the skin.  A left paramedian incision was made over this level.  This was carried down to the anterior rectus sheath and the anterior rectus sheath was opened in line with the skin incision.  The rectus muscle was mobilized circumferentially.  The retroperitoneal space was opened in the left lower quadrant and the intraperitoneal contents were mobilized to the right.  The posterior rectus sheath was opened laterally to allow continued mobilization.  The patient had transitional anatomy and the bifurcation was above the level of the 4 5 disc.  The middle sacral vessels were ligated and divided.  Blunt dissection over the disc was continued to the right and left to give exposure.  There was extensive scarring due to the prior posterior fusion.  Once there was adequate exposure to L4-5 from the level between the iliac vessels exposure was then directed towards the L3-4 disc.  Again there was extensive osteophyte formation and scarring.  There was some bleeding from the lateral aspect of  the left iliac vein and this was controlled with 5-0 Prolene sutures.  The iliolumbar vein was identified and was ligated with 3-0 silk ties and divided.  Blunt dissection was continued to allow mobilization to the L3-4 disc.  Lumbar branch vessels were clipped and divided.  The Thompson retractor was brought onto the field and the reverse lip 150 blades were positioned to the right and left of the L4-5 disc.  Malleable retractors were used for superior and inferior exposure.  A needle was placed in the L4-5 disc and C-arm was brought back onto the table to confirm this was the correct level.  Discectomy and fusion and instrumentation was then accomplished with Dr. Lynann Bologna which will be dictated as a separate note.  I then removed the lower level retractors and placed retractors again with a 150 reverse lip blades to the right and left of the 3 4 disc.  Malleable retractors were used for superior exposure.  The arterial venous structures were mobilized to the right to allow this exposure.  At the completion of the discectomy and instrumentation the clamps removed.  There was one area of bleeding from the lateral aspect of the vein and this was controlled with 5-0 Prolene suture.  The retractors were removed.  The paramedian fascia was closed with a 0 PDS suture.  The subcutaneous tissue was closed with 2-0 Vicryl sutures.  The vein was closed with a 4-0 Monocryl suture.  The patient had 2-3+ dorsalis pedis pulses bilaterally.  He was transferred to the recovery room in stable condition   Rosetta Posner, M.D., [facility]  02/22/2018 3:18 PM

## 2018-02-22 NOTE — Anesthesia Preprocedure Evaluation (Addendum)
Anesthesia Evaluation  Patient identified by MRN, date of birth, ID band Patient awake    Reviewed: Allergy & Precautions, H&P , NPO status , Patient's Chart, lab work & pertinent test results  Airway Mallampati: III  TM Distance: >3 FB Neck ROM: Full    Dental no notable dental hx. (+) Teeth Intact, Dental Advisory Given   Pulmonary neg pulmonary ROS, former smoker,    Pulmonary exam normal breath sounds clear to auscultation       Cardiovascular Exercise Tolerance: Good hypertension, Pt. on medications  Rhythm:Regular Rate:Normal     Neuro/Psych negative neurological ROS  negative psych ROS   GI/Hepatic Neg liver ROS, GERD  ,  Endo/Other  negative endocrine ROS  Renal/GU negative Renal ROS  negative genitourinary   Musculoskeletal  (+) Arthritis , Osteoarthritis,    Abdominal   Peds  Hematology negative hematology ROS (+)   Anesthesia Other Findings   Reproductive/Obstetrics negative OB ROS                            Anesthesia Physical Anesthesia Plan  ASA: II  Anesthesia Plan: General   Post-op Pain Management:    Induction: Intravenous  PONV Risk Score and Plan: 3 and Ondansetron, Dexamethasone and Midazolam  Airway Management Planned: Oral ETT  Additional Equipment: Arterial line  Intra-op Plan:   Post-operative Plan: Extubation in OR  Informed Consent: I have reviewed the patients History and Physical, chart, labs and discussed the procedure including the risks, benefits and alternatives for the proposed anesthesia with the patient or authorized representative who has indicated his/her understanding and acceptance.     Dental advisory given  Plan Discussed with: CRNA  Anesthesia Plan Comments:         Anesthesia Quick Evaluation

## 2018-02-23 ENCOUNTER — Encounter (HOSPITAL_COMMUNITY)
Admission: RE | Disposition: A | Discharge status: Home / Self Care | Payer: Self-pay | Source: Home / Self Care | Attending: Orthopedic Surgery

## 2018-02-23 ENCOUNTER — Inpatient Hospital Stay (HOSPITAL_COMMUNITY): Payer: Medicare HMO | Admitting: Anesthesiology

## 2018-02-23 ENCOUNTER — Encounter (HOSPITAL_COMMUNITY): Payer: Self-pay | Admitting: Orthopedic Surgery

## 2018-02-23 ENCOUNTER — Inpatient Hospital Stay (HOSPITAL_COMMUNITY): Payer: Medicare HMO

## 2018-02-23 ENCOUNTER — Inpatient Hospital Stay (HOSPITAL_COMMUNITY): Admission: RE | Admit: 2018-02-23 | Payer: Medicare HMO | Source: Home / Self Care | Admitting: Orthopedic Surgery

## 2018-02-23 SURGERY — POSTERIOR LUMBAR FUSION 2 LEVEL
Anesthesia: General | Site: Back

## 2018-02-23 MED ORDER — FENTANYL CITRATE (PF) 100 MCG/2ML IJ SOLN
INTRAMUSCULAR | Status: DC | PRN
  Administered 2018-02-23: 150 ug via INTRAVENOUS
  Administered 2018-02-23: 25 ug via INTRAVENOUS
  Administered 2018-02-23: 50 ug via INTRAVENOUS
  Administered 2018-02-23: 25 ug via INTRAVENOUS

## 2018-02-23 MED ORDER — POVIDONE-IODINE 7.5 % EX SOLN
Freq: Once | CUTANEOUS | Status: DC
  Filled 2018-02-23: qty 118

## 2018-02-23 MED ORDER — ACETAMINOPHEN 500 MG PO TABS: 1000.0000 mg | ORAL_TABLET | Freq: Once | ORAL | Status: DC

## 2018-02-23 MED ORDER — DEXAMETHASONE SODIUM PHOSPHATE 10 MG/ML IJ SOLN
INTRAMUSCULAR | Status: AC
  Filled 2018-02-23: qty 1

## 2018-02-23 MED ORDER — BUPIVACAINE-EPINEPHRINE 0.25% -1:200000 IJ SOLN
INTRAMUSCULAR | Status: DC | PRN
  Administered 2018-02-23: 26 mL

## 2018-02-23 MED ORDER — MIDAZOLAM HCL 5 MG/5ML IJ SOLN
INTRAMUSCULAR | Status: DC | PRN
  Administered 2018-02-23: 2 mg via INTRAVENOUS

## 2018-02-23 MED ORDER — SODIUM CHLORIDE 0.9 % IR SOLN
Status: DC | PRN
  Administered 2018-02-23: 1000 mL

## 2018-02-23 MED ORDER — FENTANYL CITRATE (PF) 250 MCG/5ML IJ SOLN
INTRAMUSCULAR | Status: AC
  Filled 2018-02-23: qty 5

## 2018-02-23 MED ORDER — MIDAZOLAM HCL 2 MG/2ML IJ SOLN
INTRAMUSCULAR | Status: AC
  Filled 2018-02-23: qty 2

## 2018-02-23 MED ORDER — SODIUM CHLORIDE 0.9 % IV SOLN
INTRAVENOUS | Status: DC | PRN
  Administered 2018-02-23: 500 mL

## 2018-02-23 MED ORDER — CEFAZOLIN SODIUM-DEXTROSE 2-3 GM-%(50ML) IV SOLR
INTRAVENOUS | Status: DC | PRN
  Administered 2018-02-23: 2 g via INTRAVENOUS

## 2018-02-23 MED ORDER — BUPIVACAINE LIPOSOME 1.3 % IJ SUSP
20.0000 mL | INTRAMUSCULAR | Status: DC
  Filled 2018-02-23: qty 20

## 2018-02-23 MED ORDER — LIDOCAINE 2% (20 MG/ML) 5 ML SYRINGE
INTRAMUSCULAR | Status: DC | PRN
  Administered 2018-02-23: 60 mg via INTRAVENOUS

## 2018-02-23 MED ORDER — PHENYLEPHRINE 40 MCG/ML (10ML) SYRINGE FOR IV PUSH (FOR BLOOD PRESSURE SUPPORT)
PREFILLED_SYRINGE | INTRAVENOUS | Status: AC
  Filled 2018-02-23: qty 10

## 2018-02-23 MED ORDER — BUPIVACAINE-EPINEPHRINE 0.25% -1:200000 IJ SOLN
INTRAMUSCULAR | Status: AC
  Filled 2018-02-23: qty 1

## 2018-02-23 MED ORDER — SODIUM CHLORIDE 0.9 % IV SOLN
INTRAVENOUS | Status: AC
  Filled 2018-02-23: qty 500000

## 2018-02-23 MED ORDER — PHENYLEPHRINE 40 MCG/ML (10ML) SYRINGE FOR IV PUSH (FOR BLOOD PRESSURE SUPPORT)
PREFILLED_SYRINGE | INTRAVENOUS | Status: DC | PRN
  Administered 2018-02-23: 80 ug via INTRAVENOUS
  Administered 2018-02-23 (×3): 40 ug via INTRAVENOUS

## 2018-02-23 MED ORDER — HYDROMORPHONE HCL 1 MG/ML IJ SOLN
0.2500 mg | INTRAMUSCULAR | Status: DC | PRN
  Administered 2018-02-23: 0.5 mg via INTRAVENOUS

## 2018-02-23 MED ORDER — ROCURONIUM BROMIDE 50 MG/5ML IV SOSY
PREFILLED_SYRINGE | INTRAVENOUS | Status: AC
  Filled 2018-02-23: qty 5

## 2018-02-23 MED ORDER — PROPOFOL 10 MG/ML IV BOLUS
INTRAVENOUS | Status: AC
  Filled 2018-02-23: qty 20

## 2018-02-23 MED ORDER — SUGAMMADEX SODIUM 200 MG/2ML IV SOLN
INTRAVENOUS | Status: DC | PRN
  Administered 2018-02-23: 205 mg via INTRAVENOUS

## 2018-02-23 MED ORDER — PROPOFOL 500 MG/50ML IV EMUL
INTRAVENOUS | Status: DC | PRN
  Administered 2018-02-23: 30 ug/kg/min via INTRAVENOUS

## 2018-02-23 MED ORDER — ACETAMINOPHEN 10 MG/ML IV SOLN
INTRAVENOUS | Status: AC
  Filled 2018-02-23: qty 100

## 2018-02-23 MED ORDER — LIDOCAINE 2% (20 MG/ML) 5 ML SYRINGE
INTRAMUSCULAR | Status: AC
  Filled 2018-02-23: qty 5

## 2018-02-23 MED ORDER — ROCURONIUM BROMIDE 50 MG/5ML IV SOSY
PREFILLED_SYRINGE | INTRAVENOUS | Status: DC | PRN
  Administered 2018-02-23: 50 mg via INTRAVENOUS

## 2018-02-23 MED ORDER — PROPOFOL 10 MG/ML IV BOLUS
INTRAVENOUS | Status: DC | PRN
  Administered 2018-02-23: 200 mg via INTRAVENOUS

## 2018-02-23 MED ORDER — BUPIVACAINE LIPOSOME 1.3 % IJ SUSP
INTRAMUSCULAR | Status: DC | PRN
  Administered 2018-02-23: 20 mL

## 2018-02-23 MED ORDER — LACTATED RINGERS IV SOLN
INTRAVENOUS | Status: DC | PRN
  Administered 2018-02-23 (×2): via INTRAVENOUS

## 2018-02-23 MED ORDER — DEXAMETHASONE SODIUM PHOSPHATE 10 MG/ML IJ SOLN
INTRAMUSCULAR | Status: DC | PRN
  Administered 2018-02-23: 10 mg via INTRAVENOUS

## 2018-02-23 MED ORDER — ONDANSETRON HCL 4 MG/2ML IJ SOLN
INTRAMUSCULAR | Status: DC | PRN
  Administered 2018-02-23: 4 mg via INTRAVENOUS

## 2018-02-23 MED ORDER — CEFAZOLIN SODIUM-DEXTROSE 2-4 GM/100ML-% IV SOLN
INTRAVENOUS | Status: AC
  Filled 2018-02-23: qty 100

## 2018-02-23 MED ORDER — ACETAMINOPHEN 10 MG/ML IV SOLN
INTRAVENOUS | Status: DC | PRN
  Administered 2018-02-23: 1000 mg via INTRAVENOUS

## 2018-02-23 MED ORDER — CEFAZOLIN SODIUM-DEXTROSE 2-4 GM/100ML-% IV SOLN: 2.0000 g | INTRAVENOUS | Status: DC

## 2018-02-23 MED ORDER — METHYLENE BLUE 0.5 % INJ SOLN
INTRAVENOUS | Status: AC
  Filled 2018-02-23: qty 10

## 2018-02-23 MED ORDER — ONDANSETRON HCL 4 MG/2ML IJ SOLN
INTRAMUSCULAR | Status: AC
  Filled 2018-02-23: qty 2

## 2018-02-23 MED ORDER — THROMBIN (RECOMBINANT) 20000 UNITS EX SOLR
CUTANEOUS | Status: AC
  Filled 2018-02-23: qty 20000

## 2018-02-23 MED ORDER — SODIUM CHLORIDE 0.9 % IV SOLN
INTRAVENOUS | Status: DC | PRN
  Administered 2018-02-23: 30 ug/min via INTRAVENOUS

## 2018-02-23 MED ORDER — 0.9 % SODIUM CHLORIDE (POUR BTL) OPTIME
TOPICAL | Status: DC | PRN
  Administered 2018-02-23 (×2): 1000 mL

## 2018-02-23 MED ORDER — THROMBIN 20000 UNITS EX SOLR
CUTANEOUS | Status: DC | PRN
  Administered 2018-02-23: 20 mL

## 2018-02-23 MED FILL — Thrombin (Recombinant) For Soln 20000 Unit: CUTANEOUS | Qty: 1 | Status: AC

## 2018-02-23 SURGICAL SUPPLY — 78 items
BENZOIN TINCTURE PRP APPL 2/3 (GAUZE/BANDAGES/DRESSINGS) ×3 IMPLANT
BONE VIVIGEN FORMABLE 5.4CC (Bone Implant) ×3 IMPLANT
BUR PRESCISION 1.7 ELITE (BURR) ×3 IMPLANT
BUR ROUND FLUTED 5 RND (BURR) ×2 IMPLANT
BUR ROUND FLUTED 5MM RND (BURR) ×1
CARTRIDGE OIL MAESTRO DRILL (MISCELLANEOUS) ×1 IMPLANT
CLOSURE STERI-STRIP 1/2X4 (GAUZE/BANDAGES/DRESSINGS) ×1
CLOSURE WOUND 1/2 X4 (GAUZE/BANDAGES/DRESSINGS) ×1
CLSR STERI-STRIP ANTIMIC 1/2X4 (GAUZE/BANDAGES/DRESSINGS) ×2 IMPLANT
CONT SPEC 4OZ CLIKSEAL STRL BL (MISCELLANEOUS) ×3 IMPLANT
COVER MAYO STAND STRL (DRAPES) ×3 IMPLANT
COVER SURGICAL LIGHT HANDLE (MISCELLANEOUS) ×3 IMPLANT
COVER WAND RF STERILE (DRAPES) ×3 IMPLANT
DIFFUSER DRILL AIR PNEUMATIC (MISCELLANEOUS) ×3 IMPLANT
DRAPE C-ARM 42X72 X-RAY (DRAPES) ×3 IMPLANT
DRAPE C-ARMOR (DRAPES) ×3 IMPLANT
DRAPE INCISE IOBAN 66X45 STRL (DRAPES) ×6 IMPLANT
DRAPE POUCH INSTRU U-SHP 10X18 (DRAPES) ×3 IMPLANT
DRAPE SURG 17X23 STRL (DRAPES) ×12 IMPLANT
DURAPREP 26ML APPLICATOR (WOUND CARE) ×3 IMPLANT
ELECT BLADE 4.0 EZ CLEAN MEGAD (MISCELLANEOUS) ×3
ELECT CAUTERY BLADE 6.4 (BLADE) ×6 IMPLANT
ELECT REM PT RETURN 9FT ADLT (ELECTROSURGICAL) ×3
ELECTRODE BLDE 4.0 EZ CLN MEGD (MISCELLANEOUS) ×1 IMPLANT
ELECTRODE REM PT RTRN 9FT ADLT (ELECTROSURGICAL) ×1 IMPLANT
EVACUATOR SILICONE 100CC (DRAIN) ×3 IMPLANT
GAUZE 4X4 16PLY RFD (DISPOSABLE) ×6 IMPLANT
GAUZE SPONGE 4X4 12PLY STRL (GAUZE/BANDAGES/DRESSINGS) ×3 IMPLANT
GLOVE BIO SURGEON STRL SZ7 (GLOVE) ×3 IMPLANT
GLOVE BIO SURGEON STRL SZ8 (GLOVE) ×3 IMPLANT
GLOVE BIOGEL PI IND STRL 7.0 (GLOVE) ×1 IMPLANT
GLOVE BIOGEL PI IND STRL 8 (GLOVE) ×1 IMPLANT
GLOVE BIOGEL PI INDICATOR 7.0 (GLOVE) ×2
GLOVE BIOGEL PI INDICATOR 8 (GLOVE) ×2
GOWN STRL REUS W/ TWL LRG LVL3 (GOWN DISPOSABLE) ×2 IMPLANT
GOWN STRL REUS W/ TWL XL LVL3 (GOWN DISPOSABLE) ×1 IMPLANT
GOWN STRL REUS W/TWL LRG LVL3 (GOWN DISPOSABLE) ×6
GOWN STRL REUS W/TWL XL LVL3 (GOWN DISPOSABLE) ×2
HANDPIECE INTERPULSE COAX TIP (DISPOSABLE) ×2
IV CATH 14GX2 1/4 (CATHETERS) ×3 IMPLANT
KIT BASIN OR (CUSTOM PROCEDURE TRAY) ×3 IMPLANT
KIT POSITION SURG JACKSON T1 (MISCELLANEOUS) ×3 IMPLANT
KIT TURNOVER KIT B (KITS) ×3 IMPLANT
MARKER SKIN DUAL TIP RULER LAB (MISCELLANEOUS) ×3 IMPLANT
NEEDLE HYPO 25GX1X1/2 BEV (NEEDLE) ×3 IMPLANT
NEEDLE SPNL 18GX3.5 QUINCKE PK (NEEDLE) ×6 IMPLANT
NS IRRIG 1000ML POUR BTL (IV SOLUTION) ×3 IMPLANT
OIL CARTRIDGE MAESTRO DRILL (MISCELLANEOUS) ×3
PACK LAMINECTOMY ORTHO (CUSTOM PROCEDURE TRAY) ×3 IMPLANT
PACK UNIVERSAL I (CUSTOM PROCEDURE TRAY) ×3 IMPLANT
PAD ARMBOARD 7.5X6 YLW CONV (MISCELLANEOUS) ×6 IMPLANT
PATTIES SURGICAL .5 X1 (DISPOSABLE) ×3 IMPLANT
PATTIES SURGICAL .5 X3 (DISPOSABLE) IMPLANT
PATTIES SURGICAL .5X1.5 (GAUZE/BANDAGES/DRESSINGS) ×3 IMPLANT
PATTIES SURGICAL .75X.75 (GAUZE/BANDAGES/DRESSINGS) ×3 IMPLANT
ROD EXPEDIUM PER BENT 65MM (Rod) ×6 IMPLANT
SCREW EXPEDIUM 5.5 8X40 (Screw) ×6 IMPLANT
SCREW POLY EXPEDIUM 5.5 8X30 (Screw) ×6 IMPLANT
SCREW SET SINGLE INNER (Screw) ×18 IMPLANT
SET HNDPC FAN SPRY TIP SCT (DISPOSABLE) ×1 IMPLANT
SPONGE INTESTINAL PEANUT (DISPOSABLE) IMPLANT
SPONGE SURGIFOAM ABS GEL 100 (HEMOSTASIS) ×3 IMPLANT
STRIP CLOSURE SKIN 1/2X4 (GAUZE/BANDAGES/DRESSINGS) ×2 IMPLANT
SURGIFLO W/THROMBIN 8M KIT (HEMOSTASIS) IMPLANT
SUT MNCRL AB 4-0 PS2 18 (SUTURE) ×3 IMPLANT
SUT VIC AB 0 CT1 18XCR BRD 8 (SUTURE) ×2 IMPLANT
SUT VIC AB 0 CT1 8-18 (SUTURE) ×4
SUT VIC AB 1 CT1 18XCR BRD 8 (SUTURE) ×2 IMPLANT
SUT VIC AB 1 CT1 8-18 (SUTURE) ×4
SUT VIC AB 2-0 CT2 18 VCP726D (SUTURE) ×6 IMPLANT
SYR 20CC LL (SYRINGE) ×6 IMPLANT
SYR BULB IRRIGATION 50ML (SYRINGE) ×3 IMPLANT
SYR CONTROL 10ML LL (SYRINGE) ×6 IMPLANT
SYR TB 1ML LUER SLIP (SYRINGE) ×3 IMPLANT
TOWEL OR 17X24 6PK STRL BLUE (TOWEL DISPOSABLE) ×9 IMPLANT
TOWEL OR 17X26 10 PK STRL BLUE (TOWEL DISPOSABLE) ×3 IMPLANT
TRAY FOLEY MTR SLVR 16FR STAT (SET/KITS/TRAYS/PACK) ×3 IMPLANT
YANKAUER SUCT BULB TIP NO VENT (SUCTIONS) ×3 IMPLANT

## 2018-02-23 NOTE — Plan of Care (Signed)
Pt is progressing toward desired goal 

## 2018-02-23 NOTE — Anesthesia Postprocedure Evaluation (Signed)
Anesthesia Post Note  Patient: Manuel Hood  Procedure(s) Performed: REVISION POSTERIOR SPINAL FUSION LUMBAR 3-4, LUMBAR 4-5 WITH INSTRUMENTATION AND ALLOGRAFT (N/A Back)     Patient location during evaluation: PACU Anesthesia Type: General Level of consciousness: awake and alert Pain management: pain level controlled Vital Signs Assessment: post-procedure vital signs reviewed and stable Respiratory status: spontaneous breathing, nonlabored ventilation and respiratory function stable Cardiovascular status: blood pressure returned to baseline and stable Postop Assessment: no apparent nausea or vomiting Anesthetic complications: no    Last Vitals:  Vitals:   02/23/18 1035 02/23/18 1055  BP: (!) 160/76 (!) 165/85  Pulse: 98 98  Resp: 14 13  Temp: (!) 36.1 C 37.2 C  SpO2: 96% 97%    Last Pain:  Vitals:   02/23/18 1035  TempSrc:   PainSc: 4                  Kwasi Joung,W. EDMOND

## 2018-02-23 NOTE — Op Note (Signed)
PATIENT: Manuel Hood RECORD #: [id number]  DOB: Apr 17, 1950  DATE OF SURGERY: 02/23/2018   OPERATIVE REPORT   PREOPERATIVE DIAGNOSES: 1. Nonunion, L3/4, L4/5, s/p anterior fusion, requiring posterior instrumentation and fusion  POSTOPERATIVE DIAGNOSES: 1. Nonunion, L3/4, L4/5, s/p anterior fusion, requiring posterior instrumentation and fusion       PROCEDURE:   1.  Posterior spinal fusion, L3-4, L4-5 2.  Removal and replacement of previously-placed screws L3, L5, bilaterally (sized up to 73mm) 3.  Use of allograft, ViviGen 4.  Intraoperative use of fluoroscopy  SURGEON:  Phylliss Bob, MD.  ASSISTANTPricilla Holm, PA-C.  ANESTHESIA:  General endotracheal anesthesia.  COMPLICATIONS:  None.  DISPOSITION:  Stable.  ESTIMATED BLOOD LOSS:  Minimal.  INDICATIONS FOR SURGERY:  Briefly, [name] is a very pleasant 68 year-old male, who did have a lumbar decompression and fusion procedure previously.  This did go on to a nonunion.  He was brought to surgery yesterday for anterior lumbar interbody fusion with instrumentation.  He presents today for a revision posterior fusion procedure.  Please refer to my operative report dated 02/22/2018 for additional details with regards to his indication for surgery.   OPERATIVE DETAILS:  On 1/23/202, the patient was brought to surgery and general endotracheal anesthesia was administered.  The patient was placed prone on a well-padded flat Jackson bed with a spinal frame. Antibiotics were given and the back was prepped and draped in the usual sterile fashion.  Specifically, a Betadine scrub was utilized with Betadine paint. The 2-0 nylon stitch was then removed, and a Betadine scrub and paint was performed once again. At this point, the wound was opened, and pulsatile irrigation was used, both superficially and also deep to the fascia. At this point, the facet joints on the left side at L3-4 and  L4-5 were subperiosteally exposed.  I did use a high-speed bur to decorticate the facet joints and posterior lateral gutter on the left side.  At this point, the bilateral L3 and L5 screws were evaluated, and were noted to be loose, which is what was anticipated based on the patient's preoperative CAT scan.  The L4 screws were stable.  The 6 mm screws that were removed at L3 and L5 bilaterally were exchanged for 8 mm screws of the appropriate length.  At this point, the wound was again copiously irrigated.  Allograft in the form of Vivigen was packed into the facet joints and posterior lateral gutter on the left to help with the success of the fusion.  37mm interconnecting rods were then placed bilaterally from L3-L5.  Caps were placed and a final locking procedure was performed.  I was very pleased with the final AP and lateral fluoroscopic images. Of note, I did use triggered EMG to test the newly placed screws at L3 and L5.  There was no screw that tested below 20 mA, confirming that there was no contact of the newly placed screws and the spinal nerve roots.   At this point, the wound was closed in layers using #1 Vicryl, followed by 2-0 Vicryl, followed by 4- 0 Monocryl.  Benzoin and Steri-Strips were applied, followed by a sterile dressing.  All instrument counts were correct at the termination of the procedure.  Of note, Pricilla Holm, PA-C, was my assistant throughout surgery, and did aid in retraction, suctioning, and closure from start to finish.    Phylliss Bob, MD

## 2018-02-23 NOTE — H&P (Signed)
Patient tolerated day 1 of his surgery yesterday well. Presents today for stage 2 - a posterior fusion with revision instrumentation, L3-L5. Will proceed as planned.

## 2018-02-23 NOTE — Progress Notes (Signed)
Orthopedic Tech Progress Note Patient Details:  [name] 1950/12/30 [id number] Called in order  Patient ID: Manuel Hood, male   DOB: 1950-04-10, 68 y.o.   MRN: [id number]   Janit Pagan 02/23/2018, 10:18 AM

## 2018-02-23 NOTE — Progress Notes (Signed)
VS taken on arrival to 3w11

## 2018-02-23 NOTE — Progress Notes (Signed)
OT Cancellation Note  Patient Details Name: ARNOL MCGIBBON MRN: [id number] DOB: May 16, 1950   Cancelled Treatment:    Reason Eval/Treat Not Completed: Patient at procedure or test/ unavailable. Pt is surgery for revision. Will attempt to see tomorrow if appropriate.   Ramond Dial, OT/L   Acute OT Clinical Specialist Acute Rehabilitation Services Pager [phone] Office [phone]  02/23/2018, 11:20 AM

## 2018-02-23 NOTE — Transfer of Care (Signed)
Immediate Anesthesia Transfer of Care Note  Patient: Manuel Hood  Procedure(s) Performed: REVISION POSTERIOR SPINAL FUSION LUMBAR 3-4, LUMBAR 4-5 WITH INSTRUMENTATION AND ALLOGRAFT (N/A Back)  Patient Location: PACU  Anesthesia Type:General  Level of Consciousness: awake and patient cooperative  Airway & Oxygen Therapy: Patient Spontanous Breathing  Post-op Assessment: Report given to RN and Post -op Vital signs reviewed and stable  Post vital signs: Reviewed and stable  Last Vitals:  Vitals Value Taken Time  BP    Temp    Pulse 98 02/23/2018 10:07 AM  Resp 26 02/23/2018 10:06 AM  SpO2 98 % 02/23/2018 10:07 AM  Vitals shown include unvalidated device data.  Last Pain:  Vitals:   02/23/18 0501  TempSrc: Oral  PainSc:       Patients Stated Pain Goal: 2 (94/37/00 5259)  Complications: No apparent anesthesia complications

## 2018-02-23 NOTE — Anesthesia Procedure Notes (Signed)
Procedure Name: Intubation Date/Time: 02/23/2018 7:42 AM Performed by: Lance Coon, CRNA Pre-anesthesia Checklist: Patient identified, Emergency Drugs available, Suction available, Patient being monitored and Timeout performed Patient Re-evaluated:Patient Re-evaluated prior to induction Oxygen Delivery Method: Circle system utilized Preoxygenation: Pre-oxygenation with 100% oxygen Induction Type: IV induction Ventilation: Mask ventilation without difficulty Laryngoscope Size: Miller and 3 Grade View: Grade II Tube type: Oral Tube size: 7.5 mm Number of attempts: 3 Airway Equipment and Method: Stylet Placement Confirmation: ETT inserted through vocal cords under direct vision,  positive ETCO2 and breath sounds checked- equal and bilateral Secured at: 22 cm Tube secured with: Tape Dental Injury: Teeth and Oropharynx as per pre-operative assessment  Comments: DLx2 grade 3 view MAC 4 blade per SRNA. DLx1 successful per CRNA

## 2018-02-23 NOTE — Progress Notes (Signed)
Pt stood up abruptly by pulling up on bed. Pt's anterior  incision started bleeding. Nurse re-enforced with abdominal pad and gauze, Surgeon notified. RN will continue to assess pt and surgical sight.

## 2018-02-23 NOTE — Progress Notes (Signed)
Patient ID: Manuel Hood, male   DOB: 06/19/1950, 68 y.o.   MRN: [id number] Patient is currently in the holding area prepared for posterior fusion today with Dr. Lynann Bologna.  His wife is present in the room.  He is quite comfortable and looks great.  Reports mild lower abdominal soreness. Abdomen soft with dressing intact. 2-3+ dorsalis pedis pulses bilaterally. Stable from standpoint of anterior approach.  Discussed with the patient and his wife.  We will not follow actively.  Please call if we can assist

## 2018-02-23 NOTE — Progress Notes (Signed)
PT Cancellation Note  Patient Details Name: Manuel Hood MRN: [id number] DOB: May 18, 1950   Cancelled Treatment:    Reason Eval/Treat Not Completed: Patient at procedure or test/unavailable.  Noted pt for second surgery today. Will continue to follow and initiate PT eval when pt is available and medically appropriate.    Thelma Comp 02/23/2018, 7:30 AM   Rolinda Roan, PT, DPT Acute Rehabilitation Services Pager: [phone] Office: [phone]

## 2018-02-24 NOTE — Progress Notes (Signed)
    Patient doing well  Patient continues to deny leg pain Minimal expected LBP Patient states that he has not been OOB since late yesterday afternoon   Physical Exam: Vitals:   02/23/18 2316 02/24/18 0413  BP: (!) 143/80 140/85  Pulse: 84 99  Resp: 20 18  Temp: 98.9 F (37.2 C) 98.1 F (36.7 C)  SpO2: 99% 95%    Dressing in place anteriorly and posteriorly - no bleeding noted NVI  POD s/p L3/4 and L4/5 A/P fusion, doing very well with resolved leg pain  - up with PT/OT, encourage ambulation with brace - patient needs to get OOB - discussed with nurse - Percocet for pain, Valium for muscle spasms - d/c home today vs tomorrow once cleared by PT with f/u in 2 weeks

## 2018-02-24 NOTE — Evaluation (Signed)
Physical Therapy Evaluation Patient Details Name: Manuel Hood MRN: [id number] DOB: 04/21/1950 Today's Date: 02/24/2018   History of Present Illness  68 yo s/p POSTERIOR REMOVAL OF INSTRUMENTATION . ANTERIOR LUMBAR INTERBODY FUSION LUMBAR 3-4, LUMBAR 4-5 WITH INSTRUMENTATION AND ALLOGRAFT  on 02/22/18,  FOLLOWED BY A POSTERIOR FUSION WITH INSTRUMENTATION AT STAGE 2 OF THE PROCEDURE 02/23/18. Pt had nonunion of prior spinal fusion.   Clinical Impression  Patient is s/p above surgery resulting in the deficits listed below (see PT Problem List). Pt ambulated 200' with RW, no loss of balance. Reviewed back precautions and log roll technique. Good progress expected.  Patient will benefit from skilled PT to increase their independence and safety with mobility (while adhering to their precautions) to allow discharge to the venue listed below.     Follow Up Recommendations Follow surgeon's recommendation for DC plan and follow-up therapies    Equipment Recommendations  None recommended by PT    Recommendations for Other Services       Precautions / Restrictions Precautions Precautions: Back Precaution Booklet Issued: Yes (comment) Required Braces or Orthoses: Spinal Brace Spinal Brace: Thoracolumbosacral orthotic(pt stated he was told to wear straps "like suspenders" by surgeon's assistant, called placed to clarify) Restrictions Weight Bearing Restrictions: No      Mobility  Bed Mobility               General bed mobility comments: up in recliner (verbally reviewed log roll technique, handout issued)  Transfers Overall transfer level: Modified independent Equipment used: Rolling walker (2 wheeled)             General transfer comment: use of B armrests  Ambulation/Gait Ambulation/Gait assistance: Supervision Gait Distance (Feet): 200 Feet Assistive device: Rolling walker (2 wheeled) Gait Pattern/deviations: Step-through pattern;Decreased stride length Gait velocity:  WFL   General Gait Details: steady, no loss of balance  Stairs            Wheelchair Mobility    Modified Rankin (Stroke Patients Only)       Balance Overall balance assessment: Modified Independent                                           Pertinent Vitals/Pain Pain Assessment: 0-10 Pain Score: 7  Pain Location: incisions front and back Pain Descriptors / Indicators: Sore    Home Living Family/patient expects to be discharged to:: Private residence Living Arrangements: Spouse/significant other Available Help at Discharge: Family;Available 24 hours/day Type of Home: House Home Access: Stairs to enter Entrance Stairs-Rails: Can reach both Entrance Stairs-Number of Steps: 4 Home Layout: One level Home Equipment: Walker - 2 wheels;Cane - single point;Grab bars - tub/shower      Prior Function Level of Independence: Independent         Comments: no falls in past 1 year     Hand Dominance   Dominant Hand: Right    Extremity/Trunk Assessment   Upper Extremity Assessment Upper Extremity Assessment: Defer to OT evaluation    Lower Extremity Assessment Lower Extremity Assessment: Overall WFL for tasks assessed    Cervical / Trunk Assessment Cervical / Trunk Assessment: Other exceptions(back fusion)  Communication   Communication: No difficulties  Cognition Arousal/Alertness: Awake/alert Behavior During Therapy: WFL for tasks assessed/performed Overall Cognitive Status: Within Functional Limits for tasks assessed  General Comments      Exercises     Assessment/Plan    PT Assessment Patient needs continued PT services  PT Problem List Decreased mobility;Decreased activity tolerance;Pain       PT Treatment Interventions Gait training;Stair training;Functional mobility training;Therapeutic exercise;Patient/family education;Therapeutic activities    PT Goals (Current  goals can be found in the Care Plan section)  Acute Rehab PT Goals Patient Stated Goal: yard work PT Goal Formulation: With patient Time For Goal Achievement: 03/03/18 Potential to Achieve Goals: Good    Frequency Min 6X/week   Barriers to discharge        Co-evaluation               AM-PAC PT "6 Clicks" Mobility  Outcome Measure Help needed turning from your back to your side while in a flat bed without using bedrails?: A Little Help needed moving from lying on your back to sitting on the side of a flat bed without using bedrails?: A Little Help needed moving to and from a bed to a chair (including a wheelchair)?: A Little Help needed standing up from a chair using your arms (e.g., wheelchair or bedside chair)?: None Help needed to walk in hospital room?: A Little Help needed climbing 3-5 steps with a railing? : A Little 6 Click Score: 19    End of Session Equipment Utilized During Treatment: Back brace Activity Tolerance: Patient tolerated treatment well Patient left: Other (comment)(in bathroom with OT) Nurse Communication: Mobility status PT Visit Diagnosis: Difficulty in walking, not elsewhere classified (R26.2);Pain    Time: 0051-1021 PT Time Calculation (min) (ACUTE ONLY): 17 min   Charges:   PT Evaluation $PT Eval Low Complexity: 1 Low         Blondell Reveal Kistler PT 02/24/2018  Acute Rehabilitation Services Pager [phone] Office [phone]

## 2018-02-24 NOTE — Care Management Important Message (Signed)
Important Message  Patient Details  Name: Manuel Hood MRN: [id number] Date of Birth: Jun 01, 1950   Medicare Important Message Given:  Yes    Iris Tatsch P [place] 02/24/2018, 11:14 AM

## 2018-02-24 NOTE — Progress Notes (Signed)
Occupational Therapy Evaluation Patient Details Name: Manuel Hood MRN: [id number] DOB: 05-23-1950 Today's Date: 02/24/2018    History of Present Illness 68 yo s/p POSTERIOR REMOVAL OF INSTRUMENTATION . ANTERIOR LUMBAR INTERBODY FUSION LUMBAR 3-4, LUMBAR 4-5 WITH INSTRUMENTATION AND ALLOGRAFT  on 02/22/18,  FOLLOWED BY A POSTERIOR FUSION WITH INSTRUMENTATION AT STAGE 2 OF THE PROCEDURE 02/23/18. Pt had nonunion of prior spinal fusion.    Clinical Impression   PTA, pt modified independent with ADL and mobility. Pt currently requires mod A with LB ADL and min guard A with functional mobility. Pt will need a 3in1 to use as shower chair in addition to recommended AE. Will follow acutely to facilitate safe DC home with wife.     Follow Up Recommendations  No OT follow up;Supervision - Intermittent    Equipment Recommendations  3 in 1 bedside commode    Recommendations for Other Services       Precautions / Restrictions Precautions Precautions: Back Precaution Booklet Issued: Yes (comment) Required Braces or Orthoses: Spinal Brace Spinal Brace: Thoracolumbosacral orthotic(pt stated he was told to wear straps "like suspenders" by surgeon's assistant, called placed to clarify) Restrictions Weight Bearing Restrictions: No      Mobility Bed Mobility       Sit to sidelying: Min assist General bed mobility comments: Educated for correct technique  Transfers Overall transfer level: Needs assistance Equipment used: Standard walker Transfers: Sit to/from Stand Sit to Stand: Min guard         General transfer comment: use of B armrests    Balance Overall balance assessment: Modified Independent                                         ADL either performed or assessed with clinical judgement   ADL Overall ADL's : Needs assistance/impaired     Grooming: Supervision/safety;Oral care   Upper Body Bathing: Sitting;Set up   Lower Body Bathing: Moderate  assistance;Sit to/from stand   Upper Body Dressing : Minimal assistance;Sitting Upper Body Dressing Details (indicate cue type and reason): to donn brace Lower Body Dressing: Moderate assistance;Sit to/from stand   Toilet Transfer: Min guard;Ambulation;RW;Grab bars   Toileting- Clothing Manipulation and Hygiene: Moderate assistance;Sitting/lateral lean       Functional mobility during ADLs: Min guard;Rolling walker;Cueing for safety       Vision         Perception     Praxis      Pertinent Vitals/Pain Pain Assessment: 0-10 Pain Score: 5  Pain Location: incisions front and back Pain Descriptors / Indicators: Sore Pain Intervention(s): Limited activity within patient's tolerance;Repositioned     Hand Dominance Right   Extremity/Trunk Assessment Upper Extremity Assessment Upper Extremity Assessment: Overall WFL for tasks assessed(hx of R shoulder surgery but functional)   Lower Extremity Assessment Lower Extremity Assessment: Defer to PT evaluation   Cervical / Trunk Assessment Cervical / Trunk Assessment: Other exceptions(back fusion)   Communication Communication Communication: No difficulties   Cognition Arousal/Alertness: Awake/alert Behavior During Therapy: WFL for tasks assessed/performed Overall Cognitive Status: Within Functional Limits for tasks assessed                                     General Comments       Exercises     Shoulder Instructions  Home Living Family/patient expects to be discharged to:: Private residence Living Arrangements: Spouse/significant other Available Help at Discharge: Family;Available 24 hours/day Type of Home: House Home Access: Stairs to enter [employer] of Steps: 4 Entrance Stairs-Rails: Can reach both Home Layout: One level     Bathroom Shower/Tub: Teacher, early years/pre: Handicapped height Bathroom Accessibility: Yes How Accessible: Accessible via walker Home  Equipment: [place] - 2 wheels;Cane - single point;Grab bars - tub/shower          Prior Functioning/Environment Level of Independence: Independent        Comments: no falls in past 1 year        OT Problem List: Decreased activity tolerance;Decreased safety awareness;Decreased knowledge of use of DME or AE;Decreased knowledge of precautions;Pain      OT Treatment/Interventions: Self-care/ADL training;DME and/or AE instruction;Therapeutic activities;Patient/family education    OT Goals(Current goals can be found in the care plan section) Acute Rehab OT Goals Patient Stated Goal: to not have to do this again OT Goal Formulation: With patient Time For Goal Achievement: 03/10/18 Potential to Achieve Goals: Good  OT Frequency: Min 3X/week   Barriers to D/C:            Co-evaluation              AM-PAC OT "6 Clicks" Daily Activity     Outcome Measure Help from another person eating meals?: None Help from another person taking care of personal grooming?: A Little Help from another person toileting, which includes using toliet, bedpan, or urinal?: A Little Help from another person bathing (including washing, rinsing, drying)?: A Little Help from another person to put on and taking off regular upper body clothing?: A Little Help from another person to put on and taking off regular lower body clothing?: A Little 6 Click Score: 19   End of Session Equipment Utilized During Treatment: Rolling walker;Back brace Nurse Communication: Mobility status  Activity Tolerance: Patient tolerated treatment well Patient left: in chair;with call bell/phone within reach;with family/visitor present  OT Visit Diagnosis: Pain Pain - part of body: (back)                Time: 1120-1140 OT Time Calculation (min): 20 min Charges:  OT General Charges $OT Visit: 1 Visit OT Evaluation $OT Eval Moderate Complexity: [place], OT/L   Acute OT Clinical Specialist Acute  Rehabilitation Services Pager [phone] Office [phone]   [facility] 02/24/2018, 1:32 PM

## 2018-02-24 NOTE — Consult Note (Signed)
            [facility] CM Primary Care Navigator  02/24/2018  Manuel Hood [facility] 06/28/1950 [id number]   Seenpatient at the bedside to identify possible discharge needs.  Patient reportsthat he was having "ongoing pain to lower back down to right hip and right foot", with MRI revealing a nonunion at L3/4 and L4/5 which had led to this admission/ surgery. (revision posterior spinal fusion)  Patient endorsesDr.Kevin Little with [facility] as [place] care provider.   Patient shared [place] and [employer] [facility] Order Delivery service toobtain medications without any problem.   Patientstatesthathe has beenmanaginghis medications at home straight out of the containers.   Patientverbalizedthat he has been driving prior to admission/ surgery but wife Manuel Hood) will be able toprovide transportation to his doctors' appointmentsafter discharge.  Patientreportslivingwith wife who will serve as his primary caregiver at home.   Anticipated discharge plan ishomewith possible home health services per patient. Awaiting PT/ OT evaluation at this time.  Patient voiced understanding to call primary care provider's office for a post discharge follow-up appointment within1- 2 weeks orsooner if needs arise. Patient letter (with PCP's contact number) wasprovided asa reminder.   Discussed with patient regarding [facility] services available for health management andresourcesat home but he denies any current needs or concernsat this time. He states thathe had been living healthy and able to manage his health so far. Patient expressed understanding of needto seekreferral from primary care provider to [facility] care management ifdeemed necessary and appropriate for any services in thefuture.  [facility] care management information was provided for futureneeds thatpatientmay have.  Patienthowever,verbally  agreedand optedfor EMMI calls to follow-up withhisrecovery at home.   Referral made for [facility] General calls after discharge.  Primary care provider's office is listed as providing transition of care (TOC).   For additional questions please contact:  Edwena Felty A. Velna Hedgecock, BSN, RN-BC [facility] PRIMARY CARE Navigator Cell: [phone]

## 2018-02-24 NOTE — Care Management Note (Signed)
Case Management Note  Patient Details  Name: Manuel Hood MRN: [id number] Date of Birth: 11/20/1950  Subjective/Objective:      Pt s/p 2 lumbar surgeries this admission. He is from home with his spouse that can provide 24 hour supervision. DME: all needed per pt. Walker, elevated commode, grab bars No issues obtaining meds at home. Wife provides transportation.              Action/Plan: No f/u per PT. Awaiting OT recs. CM following for further d/c needs.   Expected Discharge Date:                  Expected Discharge Plan:  Home/Self Care  In-House Referral:     Discharge planning Services     Post Acute Care Choice:    Choice offered to:     DME Arranged:    DME Agency:     HH Arranged:    [place] Agency:     Status of Service:  Completed, signed off  If discussed at [employer] of Stay Meetings, dates discussed:    Additional Comments:  Pollie Friar, RN 02/24/2018, 12:13 PM

## 2018-02-24 NOTE — Progress Notes (Signed)
OT Treatment Note  Pt seen for additional session for pt/family education on use of back precautions with AE and DME. Pt educated on recommended AE and use of 3in1 for shower chair. Pt very appreciative. Making excellent progress. Appropriate for DC when medically stable. Will continue to follow acutely.     02/24/18 1334  OT Visit Information  Last OT Received On 02/24/18  Assistance Needed +1  History of Present Illness 68 yo s/p POSTERIOR REMOVAL OF INSTRUMENTATION . ANTERIOR LUMBAR INTERBODY FUSION LUMBAR 3-4, LUMBAR 4-5 WITH INSTRUMENTATION AND ALLOGRAFT  on 02/22/18,  FOLLOWED BY A POSTERIOR FUSION WITH INSTRUMENTATION AT STAGE 2 OF THE PROCEDURE 02/23/18. Pt had nonunion of prior spinal fusion.   Precautions  Precautions Back  Precaution Booklet Issued Yes (comment)  Required Braces or Orthoses Spinal Brace  Spinal Brace TLSO  Pain Assessment  Pain Assessment 0-10  Pain Score 5  Pain Location incisions front and back  Pain Descriptors / Indicators Sore  Pain Intervention(s) Limited activity within patient's tolerance;Repositioned  ADL  General ADL Comments Completed session with pt/wife regarding use of compensatory strtegies and use of AE and DME to increase independence with ADL and mobility hwile adhering to back precautions. Pt will nee toilet aid, sock aid and long handled shoe horn. Recommend use of 3in1 as shower chair. Pt able to return demonstrate use of AE. Educated on proper positoning of back brace as pt was wearing the axilla straps over his shoulders.   Bed Mobility  Overal bed mobility Needs Assistance  Bed Mobility Sit to Sidelying  Sit to sidelying Min assist  General bed mobility comments Educated for correct technique  Restrictions  Weight Bearing Restrictions No  Transfers  Overall transfer level Needs assistance  Equipment used Rolling walker (2 wheeled)  Sit to Stand Supervision  OT - End of Session  Equipment Utilized During Treatment Rolling walker;Back  brace  Activity Tolerance Patient tolerated treatment well  Patient left in bed;with call bell/phone within reach;with family/visitor present;with SCD's reapplied  Nurse Communication Mobility status  OT Assessment/Plan  OT Plan Discharge plan remains appropriate  OT Visit Diagnosis Pain  Pain - part of body  (back)  OT Frequency (ACUTE ONLY) Min 3X/week  Follow Up Recommendations No OT follow up;Supervision - Intermittent  OT Equipment 3 in 1 bedside commode  AM-PAC OT "6 Clicks" Daily Activity Outcome Measure (Version 2)  Help from another person eating meals? 4  Help from another person taking care of personal grooming? 3  Help from another person toileting, which includes using toliet, bedpan, or urinal? 3  Help from another person bathing (including washing, rinsing, drying)? 3  Help from another person to put on and taking off regular upper body clothing? 3  Help from another person to put on and taking off regular lower body clothing? 3  6 Click Score 19  OT Goal Progression  Progress towards OT goals Progressing toward goals  Acute Rehab OT Goals  Patient Stated Goal to not have to do this again  OT Goal Formulation With patient  Time For Goal Achievement 03/10/18  Potential to Achieve Goals Good  OT Time Calculation  OT Start Time (ACUTE ONLY) 1154  OT Stop Time (ACUTE ONLY) 1227  OT Time Calculation (min) 33 min  OT General Charges  $OT Visit 1 Visit  OT Treatments  $Self Care/Home Management  23-37 mins  Maurie Boettcher, OT/L   Acute OT Clinical Specialist [place] Pager [phone] Office [phone]

## 2018-02-25 NOTE — Progress Notes (Signed)
Requested 3/1 to be delivered to patient's room prior to DC.

## 2018-02-25 NOTE — Progress Notes (Signed)
Subjective: 2 Days Post-Op Procedure(s) (LRB): REVISION POSTERIOR SPINAL FUSION LUMBAR 3-4, LUMBAR 4-5 WITH INSTRUMENTATION AND ALLOGRAFT (N/A) Patient reports pain as mild.  The patient is doing well.  Denies leg pain.  Has been ambulating in the hall.  He is eating breakfast with his back brace on.  Has appropriate low back pain.  Reports she is ready to go home.  Taking by mouth and voiding okay.  Positive flatus.  Objective: Vital signs in last 24 hours: Temp:  [97.4 F (36.3 C)-98.6 F (37 C)] 97.8 F (36.6 C) (01/25 0847) Pulse Rate:  [97-102] 102 (01/25 0847) Resp:  [18-20] 20 (01/25 0847) BP: (120-141)/(62-84) 141/73 (01/25 0847) SpO2:  [94 %-99 %] 97 % (01/25 0847)  Intake/Output from previous day: 01/24 0701 - 01/25 0700 In: 113.3 [I.V.:113.3] Out: 250 [Urine:250] Intake/Output this shift: Total I/O In: 240 [P.O.:240] Out: -   Recent Labs    02/22/18 1110  HGB 14.3   Recent Labs    02/22/18 1110  HCT 42.0   Recent Labs    02/22/18 1110  NA 137  K 4.6   No results for input(s): LABPT, INR in the last 72 hours. Lumbar spine exam: Dressing in place anteriorly and posteriorly.  Dressings clean and dry.  Neurovascular status is intact to both lower extremities.    Assessment/Plan: 2 Days Post-Op Procedure(s) (LRB): REVISION POSTERIOR SPINAL FUSION LUMBAR 3-4, LUMBAR 4-5 WITH INSTRUMENTATION AND ALLOGRAFT (N/A) Plan: Discharge home today.  Wear back brace when up. Rx for Percocet for pain, Valium for spasm. Follow-up with Dr. Lynann Bologna in 2 weeks.    Erlene Senters 02/25/2018, 8:51 AM

## 2018-02-25 NOTE — Progress Notes (Signed)
Physical Therapy Treatment Patient Details Name: Manuel Hood MRN: [id number] DOB: Jul 04, 1950 Today's Date: 02/25/2018    History of Present Illness Pt is a 68 y/o male s/p POSTERIOR REMOVAL OF INSTRUMENTATION . ANTERIOR LUMBAR INTERBODY FUSION LUMBAR 3-4, LUMBAR 4-5 WITH INSTRUMENTATION AND ALLOGRAFT  on 02/22/18,  FOLLOWED BY A POSTERIOR FUSION WITH INSTRUMENTATION AT STAGE 2 OF THE PROCEDURE 02/23/18. Pt had nonunion of prior spinal fusion.     PT Comments    Pt making steady progress with functional mobility. He tolerated stair training this session without difficulty. PT reviewed 3/3 back precautions with pt and discussed a generalized walking program for pt to initiate upon d/c. Plan is for pt to d/c home today with his wife.     Follow Up Recommendations  No PT follow up     Equipment Recommendations  None recommended by PT    Recommendations for Other Services       Precautions / Restrictions Precautions Precautions: Back Precaution Booklet Issued: Yes (comment) Precaution Comments: reviewed 3/3 back precautions with pt throughout session Required Braces or Orthoses: Spinal Brace Spinal Brace: Thoracolumbosacral orthotic Restrictions Weight Bearing Restrictions: No    Mobility  Bed Mobility               General bed mobility comments: OOB in chair  Transfers Overall transfer level: Modified independent Equipment used: Rolling walker (2 wheeled)                Ambulation/Gait Ambulation/Gait assistance: Supervision Gait Distance (Feet): 200 Feet Assistive device: Rolling walker (2 wheeled) Gait Pattern/deviations: Step-through pattern Gait velocity: WFL   General Gait Details: pt steady with RW, no LOB or need for physical assistance   Stairs Stairs: Yes Stairs assistance: Supervision Stair Management: Two rails;Step to pattern;Forwards Number of Stairs: 2(x2 trials)     Wheelchair Mobility    Modified Rankin (Stroke Patients Only)        Balance Overall balance assessment: Modified Independent                                          Cognition Arousal/Alertness: Awake/alert Behavior During Therapy: WFL for tasks assessed/performed Overall Cognitive Status: Within Functional Limits for tasks assessed                                        Exercises      General Comments        Pertinent Vitals/Pain Pain Assessment: 0-10 Pain Score: 4  Pain Location: incision site Pain Descriptors / Indicators: Sore Pain Intervention(s): Monitored during session    Home Living                      Prior Function            PT Goals (current goals can now be found in the care plan section) Acute Rehab PT Goals Patient Stated Goal: to not have to do this again PT Goal Formulation: With patient Time For Goal Achievement: 03/03/18 Potential to Achieve Goals: Good Progress towards PT goals: Progressing toward goals    Frequency    Min 5X/week      PT Plan Current plan remains appropriate    Co-evaluation  AM-PAC PT "6 Clicks" Mobility   Outcome Measure  Help needed turning from your back to your side while in a flat bed without using bedrails?: A Little Help needed moving from lying on your back to sitting on the side of a flat bed without using bedrails?: A Little Help needed moving to and from a bed to a chair (including a wheelchair)?: None Help needed standing up from a chair using your arms (e.g., wheelchair or bedside chair)?: None Help needed to walk in hospital room?: None Help needed climbing 3-5 steps with a railing? : None 6 Click Score: 22    End of Session Equipment Utilized During Treatment: Gait belt;Back brace Activity Tolerance: Patient tolerated treatment well Patient left: in chair;with call bell/phone within reach Nurse Communication: Mobility status PT Visit Diagnosis: Other abnormalities of gait and mobility  (R26.89)     Time: 6384-5364 PT Time Calculation (min) (ACUTE ONLY): 13 min  Charges:  $Gait Training: 8-22 mins                     Sherie Don, PT, DPT  Acute Rehabilitation Services Pager [phone] Office [place] 02/25/2018, 10:10 AM

## 2018-02-25 NOTE — Progress Notes (Signed)
Occupational Therapy Treatment Patient Details Name: Manuel Hood MRN: [id number] DOB: [birth year] Today's Date: 02/25/2018    History of present illness 68 yo s/p POSTERIOR REMOVAL OF INSTRUMENTATION . ANTERIOR LUMBAR INTERBODY FUSION LUMBAR 3-4, LUMBAR 4-5 WITH INSTRUMENTATION AND ALLOGRAFT  on 02/22/18,  FOLLOWED BY A POSTERIOR FUSION WITH INSTRUMENTATION AT STAGE 2 OF THE PROCEDURE 02/23/18. Pt had nonunion of prior spinal fusion.    OT comments  Completed education regarding ADL and functional mobility for ADL. Goals met. Pt able to complete ADL with use of AE. Needs 3 in1 for DC - nsg notified.   Follow Up Recommendations  No OT follow up;Supervision - Intermittent    Equipment Recommendations  3 in 1 bedside commode    Recommendations for Other Services      Precautions / Restrictions Precautions Precautions: Back Precaution Booklet Issued: Yes (comment) Required Braces or Orthoses: Spinal Brace Spinal Brace: Thoracolumbosacral orthotic       Mobility Bed Mobility               General bed mobility comments: OOB in chair  Transfers Overall transfer level: Modified independent                    Balance Overall balance assessment: Modified Independent                                         ADL either performed or assessed with clinical judgement   ADL                                       Functional mobility during ADLs: Modified independent General ADL Comments: REviewed back precautions and answered questions regarding useof AE. Pt able to demonstrate correct technique for donning/doffing brace. Good carry over of body mechanics for transferes. Able to ambulate in room without use of RW. asking about when ihe is allowed to shower - nsg notified.      Vision       Perception     Praxis      Cognition Arousal/Alertness: Awake/alert Behavior During Therapy: WFL for tasks assessed/performed Overall Cognitive  Status: Within Functional Limits for tasks assessed                                          Exercises     Shoulder Instructions       General Comments      Pertinent Vitals/ Pain       Pain Assessment: 0-10 Pain Score: 2  Pain Location: incisions front and back Pain Descriptors / Indicators: Sore Pain Intervention(s): Limited activity within patient's tolerance  Home Living                                          Prior Functioning/Environment              Frequency           Progress Toward Goals  OT Goals(current goals can now be found in the care plan section)  Progress towards OT goals: Goals met/education completed, patient discharged from OT  Acute Rehab OT Goals Patient Stated Goal: to not have to do this again OT Goal Formulation: With patient Time For Goal Achievement: 03/10/18 Potential to Achieve Goals: Good ADL Goals Pt Will Perform Lower Body Bathing: with modified independence;sit to/from stand;with adaptive equipment Pt Will Perform Lower Body Dressing: with modified independence;with adaptive equipment;sit to/from stand Pt Will Perform Toileting - Clothing Manipulation and hygiene: with modified independence;with adaptive equipment Pt Will Perform Tub/Shower Transfer: with modified independence;ambulating;3 in 1  Plan All goals met and education completed, patient discharged from OT services    Co-evaluation                 AM-PAC OT "6 Clicks" Daily Activity     Outcome Measure   Help from another person eating meals?: None Help from another person taking care of personal grooming?: None Help from another person toileting, which includes using toliet, bedpan, or urinal?: None Help from another person bathing (including washing, rinsing, drying)?: None Help from another person to put on and taking off regular upper body clothing?: None Help from another person to put on and taking off regular  lower body clothing?: A Little 6 Click Score: 23    End of Session Equipment Utilized During Treatment: Back brace  OT Visit Diagnosis: Pain Pain - part of body: (back)   Activity Tolerance Patient tolerated treatment well   Patient Left in chair;with call bell/phone within reach   Nurse Communication Mobility status;Other (comment)(needs 3in1)        Time: 2301-7209 OT Time Calculation (min): 16 min  Charges: OT General Charges $OT Visit: 1 Visit OT Treatments $Self Care/Home Management : 8-22 mins  Maurie Boettcher, OT/L   Acute OT Clinical Specialist [place] Pager [phone] Office [phone]    [facility] 02/25/2018, 9:45 AM

## 2018-02-25 NOTE — Progress Notes (Signed)
Patient is discharging home. Wife is here for transport. All discharge paperwork went over with patient and wife. All questions and concerns addressed. Patient received a 3 in 1 bedside commode. IV taken out. Patient to be taken down in wheelchair. Hand

## 2018-02-26 DIAGNOSIS — R2681 Unsteadiness on feet: Secondary | ICD-10-CM | POA: Diagnosis not present

## 2018-02-26 DIAGNOSIS — M6281 Muscle weakness (generalized): Secondary | ICD-10-CM | POA: Diagnosis not present

## 2018-02-26 DIAGNOSIS — Z4789 Encounter for other orthopedic aftercare: Secondary | ICD-10-CM | POA: Diagnosis not present

## 2018-02-26 DIAGNOSIS — I10 Essential (primary) hypertension: Secondary | ICD-10-CM | POA: Diagnosis not present

## 2018-02-26 DIAGNOSIS — Z981 Arthrodesis status: Secondary | ICD-10-CM | POA: Diagnosis not present

## 2018-02-28 DIAGNOSIS — R2681 Unsteadiness on feet: Secondary | ICD-10-CM | POA: Diagnosis not present

## 2018-02-28 DIAGNOSIS — Z981 Arthrodesis status: Secondary | ICD-10-CM | POA: Diagnosis not present

## 2018-02-28 DIAGNOSIS — I10 Essential (primary) hypertension: Secondary | ICD-10-CM | POA: Diagnosis not present

## 2018-02-28 DIAGNOSIS — Z4789 Encounter for other orthopedic aftercare: Secondary | ICD-10-CM | POA: Diagnosis not present

## 2018-02-28 DIAGNOSIS — M6281 Muscle weakness (generalized): Secondary | ICD-10-CM | POA: Diagnosis not present

## 2018-02-28 LAB — TYPE AND SCREEN
ABO/RH(D): O POS
Antibody Screen: NEGATIVE
UNIT DIVISION: 0
Unit division: 0

## 2018-02-28 LAB — BPAM RBC
BLOOD PRODUCT EXPIRATION DATE: 202002222359
Blood Product Expiration Date: 202002222359
ISSUE DATE / TIME: 202001221020
ISSUE DATE / TIME: 202001221020
UNIT TYPE AND RH: 5100
Unit Type and Rh: 5100

## 2018-02-28 MED FILL — Heparin Sodium (Porcine) Inj 1000 Unit/ML: INTRAMUSCULAR | Qty: 30 | Status: AC

## 2018-02-28 MED FILL — Heparin Sodium (Porcine) Inj 1000 Unit/ML: INTRAMUSCULAR | Qty: 60 | Status: AC

## 2018-02-28 MED FILL — Sodium Chloride IV Soln 0.9%: INTRAVENOUS | Qty: 2000 | Status: AC

## 2018-02-28 MED FILL — Sodium Chloride IV Soln 0.9%: INTRAVENOUS | Qty: 1000 | Status: AC

## 2018-03-01 NOTE — Discharge Summary (Signed)
Patient ID: Manuel Hood MRN: [id number] DOB/AGE: 02/03/1950 68 y.o.  Admit date: 02/22/2018 Discharge date: 02/25/2018  Admission Diagnoses:  Active Problems:   Radiculopathy   Discharge Diagnoses:  Same  Past Medical History:  Diagnosis Date  . Arthritis   . GERD (gastroesophageal reflux disease)    rare  . Hypertension   . Low back pain   . Right leg pain   . Seasonal allergies   . Sinus pain     Surgeries: Procedure(s): REVISION POSTERIOR SPINAL FUSION LUMBAR 3-4, LUMBAR 4-5 WITH INSTRUMENTATION AND ALLOGRAFT on 02/23/2018   Consultants: Dr Early Vascular approach  Discharged Condition: Improved  Hospital Course: Manuel Hood is an 68 y.o. male who was admitted 02/22/2018 for operative treatment of radiculopathy. Patient has severe unremitting pain that affects sleep, daily activities, and work/hobbies. After pre-op clearance the patient was taken to the operating room on 02/23/2018 and underwent  Procedure(s): REVISION POSTERIOR SPINAL FUSION LUMBAR 3-4, LUMBAR 4-5 WITH INSTRUMENTATION AND ALLOGRAFT.    Patient was given perioperative antibiotics:  Anti-infectives (From admission, onward)   Start     Dose/Rate Route Frequency Ordered Stop   02/23/18 1115  ceFAZolin (ANCEF) IVPB 2g/100 mL premix  Status:  Discontinued     2 g 200 mL/hr over 30 Minutes Intravenous On call to O.R. 02/23/18 1105 02/23/18 1241   02/23/18 0826  polymyxin B 500,000 Units, bacitracin 50,000 Units in sodium chloride 0.9 % 500 mL irrigation  Status:  Discontinued       As needed 02/23/18 0929 02/23/18 1001   02/22/18 2000  ceFAZolin (ANCEF) IVPB 2g/100 mL premix     2 g 200 mL/hr over 30 Minutes Intravenous Every 8 hours 02/22/18 1929 02/23/18 0434   02/22/18 0630  ceFAZolin (ANCEF) IVPB 2g/100 mL premix     2 g 200 mL/hr over 30 Minutes Intravenous On call to O.R. 02/22/18 6767 02/22/18 1154       Patient was given sequential compression devices, early ambulation to prevent  DVT.  Patient benefited maximally from hospital stay and there were no complications.    Recent vital signs: BP (!) 141/73 (BP Location: Left Arm)   Pulse (!) 102   Temp 97.8 F (36.6 C) (Oral)   Resp 20   Ht 5\' 9"  (1.753 m)   Wt 101.9 kg   SpO2 97%   BMI 33.17 kg/m    Discharge Medications:   Allergies as of 02/25/2018   No Known Allergies     Medication List    STOP taking these medications   acetaminophen-codeine 300-30 MG tablet Commonly known as:  TYLENOL #3   HYDROcodone-acetaminophen 5-325 MG tablet Commonly known as:  NORCO/VICODIN   methocarbamol 750 MG tablet Commonly known as:  ROBAXIN     TAKE these medications   calcium carbonate 600 MG Tabs tablet Commonly known as:  OS-CAL Take 600 mg by mouth daily.   D3-1000 25 MCG (1000 UT) tablet Generic drug:  Cholecalciferol Take 1,000 Units by mouth daily.   DSS 100 MG Caps Take 100 mg by mouth 2 (two) times daily. What changed:  when to take this   fluticasone 50 MCG/ACT nasal spray Commonly known as:  FLONASE Place 2 sprays into both nostrils daily as needed for allergies or rhinitis.   losartan-hydrochlorothiazide 100-12.5 MG tablet Commonly known as:  HYZAAR Take 1 tablet by mouth daily.   ONE-A-DAY 50 PLUS PO Take 1 tablet by mouth daily.   oxyCODONE-acetaminophen 5-325 MG tablet  Commonly known as:  PERCOCET Take 1 tablet by mouth every 4 (four) hours as needed for up to 7 days for severe pain.   pregabalin 75 MG capsule Commonly known as:  LYRICA Take 75 mg by mouth 2 (two) times daily.   vitamin C 500 MG tablet Commonly known as:  ASCORBIC ACID Take 500 mg by mouth daily.       Diagnostic Studies: Dg Lumbar Spine 2-3 Views  Result Date: 02/23/2018 CLINICAL DATA:  L4-5 fusion. EXAM: DG C-ARM 61-120 MIN; LUMBAR SPINE - 2-3 VIEW COMPARISON:  Lumbar spine MR dated 10/27/2017 and C-arm images obtained earlier today. FINDINGS: AP and lateral C-arm images of the lumbosacral spine  demonstrate interbody and anterior and posterior screw and plate fusion at the K4-4 and L5-S1 levels. An inferior surgical drain is noted. Degenerative changes are noted more superiorly. Grossly normal alignment on these views. IMPRESSION: Anterior and posterior fusion at the L4-5 and L5-S1 levels. Electronically Signed   By: Claudie Revering M.D.   On: 02/23/2018 13:30   Dg Lumbar Spine 2-3 Views  Result Date: 02/22/2018 CLINICAL DATA:  Lumbar fusion EXAM: DG C-ARM 61-120 MIN; LUMBAR SPINE - 2-3 VIEW COMPARISON:  09/29/2016 FLUOROSCOPY TIME:  Fluoroscopy Time:  2 minutes and 58 seconds Radiation Exposure Index (if provided by the fluoroscopic device): Not available Number of Acquired Spot Images: 2 FINDINGS: Lumbar fusion at L3-4 and L4-5 with anterior fixation is now seen. Posterior pedicle screws are noted as well. IMPRESSION: Anterior fusion at L3-4 and L4-5. Electronically Signed   By: Inez Catalina M.D.   On: 02/22/2018 15:18   Dg C-arm 1-60 Min  Result Date: 02/23/2018 CLINICAL DATA:  L4-5 fusion. EXAM: DG C-ARM 61-120 MIN; LUMBAR SPINE - 2-3 VIEW COMPARISON:  Lumbar spine MR dated 10/27/2017 and C-arm images obtained earlier today. FINDINGS: AP and lateral C-arm images of the lumbosacral spine demonstrate interbody and anterior and posterior screw and plate fusion at the W1-0 and L5-S1 levels. An inferior surgical drain is noted. Degenerative changes are noted more superiorly. Grossly normal alignment on these views. IMPRESSION: Anterior and posterior fusion at the L4-5 and L5-S1 levels. Electronically Signed   By: Claudie Revering M.D.   On: 02/23/2018 13:30   Dg C-arm 1-60 Min  Result Date: 02/22/2018 CLINICAL DATA:  Lumbar fusion EXAM: DG C-ARM 61-120 MIN; LUMBAR SPINE - 2-3 VIEW COMPARISON:  09/29/2016 FLUOROSCOPY TIME:  Fluoroscopy Time:  2 minutes and 58 seconds Radiation Exposure Index (if provided by the fluoroscopic device): Not available Number of Acquired Spot Images: 2 FINDINGS: Lumbar fusion  at L3-4 and L4-5 with anterior fixation is now seen. Posterior pedicle screws are noted as well. IMPRESSION: Anterior fusion at L3-4 and L4-5. Electronically Signed   By: Inez Catalina M.D.   On: 02/22/2018 15:18   Dg Or Local Abdomen  Result Date: 02/22/2018 CLINICAL DATA:  Status post a left EXAM: OR LOCAL ABDOMEN COMPARISON:  None. FINDINGS: Postsurgical changes are noted. No radiopaque foreign body is noted. IMPRESSION: Postsurgical changes without radiopaque foreign body. Critical Value/emergent results were called by telephone at the time of interpretation on 02/22/2018 at 3:29 pm to [facility] in [place], who verbally acknowledged these results. Electronically Signed   By: Inez Catalina M.D.   On: 02/22/2018 15:30    Disposition:   Discharge Instructions    Call MD / Call 911   Complete by:  As directed    If you experience chest pain or shortness of breath, CALL  911 and be transported to the hospital emergency room.  If you develope a fever above 101 F, pus (white drainage) or increased drainage or redness at the wound, or calf pain, call your surgeon's office.   Constipation Prevention   Complete by:  As directed    Drink plenty of fluids.  Prune juice may be helpful.  You may use a stool softener, such as Colace (over the counter) 100 mg twice a day.  Use MiraLax (over the counter) for constipation as needed.   Diet general   Complete by:  As directed    Increase activity slowly as tolerated   Complete by:  As directed    Wear back brace when up.     Discharge home today.  Wear back brace when up. Rx for Percocet for pain, Valium for spasm. -Written scripts for pain signed and in chart -D/C instructions sheet printed and in chart -D/C today  -F/U in office 2 weeks   Signed: Justice Britain 03/01/2018, 11:57 AM

## 2018-03-02 DIAGNOSIS — R2681 Unsteadiness on feet: Secondary | ICD-10-CM | POA: Diagnosis not present

## 2018-03-02 DIAGNOSIS — M6281 Muscle weakness (generalized): Secondary | ICD-10-CM | POA: Diagnosis not present

## 2018-03-02 DIAGNOSIS — Z4789 Encounter for other orthopedic aftercare: Secondary | ICD-10-CM | POA: Diagnosis not present

## 2018-03-02 DIAGNOSIS — Z981 Arthrodesis status: Secondary | ICD-10-CM | POA: Diagnosis not present

## 2018-03-02 DIAGNOSIS — I10 Essential (primary) hypertension: Secondary | ICD-10-CM | POA: Diagnosis not present

## 2018-03-03 ENCOUNTER — Ambulatory Visit
Admission: RE | Admit: 2018-03-03 | Discharge: 2018-03-03 | Disposition: A | Discharge status: Home / Self Care | Payer: Medicare HMO | Source: Ambulatory Visit | Attending: Orthopedic Surgery | Admitting: Orthopedic Surgery

## 2018-03-03 ENCOUNTER — Other Ambulatory Visit: Payer: Self-pay | Admitting: Orthopedic Surgery

## 2018-03-03 DIAGNOSIS — M79605 Pain in left leg: Secondary | ICD-10-CM

## 2018-03-03 DIAGNOSIS — M960 Pseudarthrosis after fusion or arthrodesis: Secondary | ICD-10-CM | POA: Diagnosis not present

## 2018-03-03 DIAGNOSIS — M79662 Pain in left lower leg: Secondary | ICD-10-CM | POA: Diagnosis not present

## 2018-03-06 ENCOUNTER — Other Ambulatory Visit: Payer: Self-pay | Admitting: Orthopedic Surgery

## 2018-03-06 ENCOUNTER — Ambulatory Visit
Admission: RE | Admit: 2018-03-06 | Discharge: 2018-03-06 | Disposition: A | Discharge status: Home / Self Care | Payer: Medicare HMO | Source: Ambulatory Visit | Attending: Orthopedic Surgery | Admitting: Orthopedic Surgery

## 2018-03-06 DIAGNOSIS — M79606 Pain in leg, unspecified: Secondary | ICD-10-CM

## 2018-03-06 DIAGNOSIS — K5730 Diverticulosis of large intestine without perforation or abscess without bleeding: Secondary | ICD-10-CM | POA: Diagnosis not present

## 2018-03-06 MED ORDER — IOPAMIDOL (ISOVUE-300) INJECTION 61%
125.0000 mL | Freq: Once | INTRAVENOUS | Status: AC | PRN
  Administered 2018-03-06: 125 mL via INTRAVENOUS

## 2018-03-07 ENCOUNTER — Encounter: Payer: Self-pay | Admitting: Vascular Surgery

## 2018-03-07 ENCOUNTER — Ambulatory Visit (INDEPENDENT_AMBULATORY_CARE_PROVIDER_SITE_OTHER): Payer: Medicare HMO | Admitting: Vascular Surgery

## 2018-03-07 ENCOUNTER — Other Ambulatory Visit: Payer: Self-pay

## 2018-03-07 VITALS — BP 118/74 | HR 92 | Temp 97.1°F | Resp 18 | Ht 69.0 in | Wt 209.0 lb

## 2018-03-07 DIAGNOSIS — M5136 Other intervertebral disc degeneration, lumbar region: Secondary | ICD-10-CM

## 2018-03-07 NOTE — Progress Notes (Signed)
Patient name: Manuel Hood MRN: [id number] DOB: 11-Aug-1950 Sex: male  REASON FOR VISIT: Follow-up after abdominal exposure for L3-4 and L4-5 disc surgery  HPI: Manuel Hood is a 68 y.o. male here today for follow-up.  The patient underwent two-level anterior exposure from a left paramedian incision.  Subsequently back the second day for posterior fixation.  He was discharged home from the hospital.  He presented with persistent pain to Dr. Lynann Bologna and some swelling in his lower extremities.  Duplex showed no evidence of DVT.  He subsequently underwent a CT scan yesterday for further evaluation and was found to have a 13 x 7 x 6 cm extraperitoneal fluid collection in the left pelvic sidewall.  He is seen today for further discussion of this.  He reports extreme pain in his left leg.  He reports this is worse in his left foot in his medial arch and dorsum of his foot but extends up into his leg.  He is unable to obtain relief with elevation or dependency with heat or with cool application  Current Outpatient Medications  Medication Sig Dispense Refill  . calcium carbonate (OS-CAL) 600 MG TABS Take 600 mg by mouth daily.      . Cholecalciferol (D3-1000) 1000 units tablet Take 1,000 Units by mouth daily.    . diazepam (VALIUM) 5 MG tablet     . docusate sodium 100 MG CAPS Take 100 mg by mouth 2 (two) times daily. (Patient taking differently: Take 100 mg by mouth 3 (three) times daily. ) 10 capsule 0  . fluticasone (FLONASE) 50 MCG/ACT nasal spray Place 2 sprays into both nostrils daily as needed for allergies or rhinitis.     Marland Kitchen losartan-hydrochlorothiazide (HYZAAR) 100-12.5 MG tablet Take 1 tablet by mouth daily.    . methylPREDNISolone (MEDROL DOSEPAK) 4 MG TBPK tablet     . Multiple Vitamins-Minerals (ONE-A-DAY 50 PLUS PO) Take 1 tablet by mouth daily.      Marland Kitchen oxyCODONE-acetaminophen (PERCOCET/ROXICET) 5-325 MG tablet     . pregabalin (LYRICA) 75 MG capsule Take  75 mg by mouth 2 (two) times daily.    . vitamin C (ASCORBIC ACID) 500 MG tablet Take 500 mg by mouth daily.     No current facility-administered medications for this visit.      PHYSICAL EXAM: Vitals:   03/07/18 1328  BP: 118/74  Pulse: 92  Resp: 18  Temp: (!) 97.1 F (36.2 C)  TempSrc: Oral  SpO2: 95%  Weight: 209 lb (94.8 kg)  Height: 5\' 9"  (1.753 m)    GENERAL: The patient is a well-nourished male, in no acute distress. The vital signs are documented above. Mild swelling in both lower extremities which is equal right and left 2-3+ dorsalis pedis pulses bilaterally Abdomen soft with a well-healed incision.  No abdominal tenderness and no mass noted  CT scan was reviewed showing a fluid collection at the level of the dissection  MEDICAL ISSUES: Had long discussion with the patient and his wife present.  He does have a fluid collection which probably represents resolving hematoma in the area of the dissection.  No evidence of arterial or venous injury.  I explained that this will resolve spontaneously and does not require any specific treatment.  I do not feel this area of fluid would cause any left leg pain.  There is no tightness or tenderness and by physical exam I had is impossible to even tell that he has fluid collection at this area.  I discussed these findings by telephone with Dr. Lynann Bologna who will continue to follow-up with the patient   Rosetta Posner, MD [facility] Tel [phone] Pager [phone]

## 2018-03-08 ENCOUNTER — Inpatient Hospital Stay (HOSPITAL_COMMUNITY): Payer: Medicare HMO | Admitting: Certified Registered Nurse Anesthetist

## 2018-03-08 ENCOUNTER — Ambulatory Visit (HOSPITAL_COMMUNITY): Payer: Medicare HMO

## 2018-03-08 ENCOUNTER — Other Ambulatory Visit: Payer: Self-pay | Admitting: Orthopedic Surgery

## 2018-03-08 ENCOUNTER — Ambulatory Visit (HOSPITAL_COMMUNITY)
Admission: AD | Admit: 2018-03-08 | Discharge: 2018-03-08 | Disposition: A | Discharge status: Home / Self Care | Payer: Medicare HMO | Source: Ambulatory Visit | Attending: Orthopedic Surgery | Admitting: Orthopedic Surgery

## 2018-03-08 ENCOUNTER — Encounter (HOSPITAL_COMMUNITY)
Admission: AD | Disposition: A | Discharge status: Home / Self Care | Payer: Self-pay | Source: Ambulatory Visit | Attending: Orthopedic Surgery

## 2018-03-08 ENCOUNTER — Encounter (HOSPITAL_COMMUNITY): Payer: Self-pay | Admitting: Certified Registered Nurse Anesthetist

## 2018-03-08 DIAGNOSIS — Z981 Arthrodesis status: Secondary | ICD-10-CM | POA: Insufficient documentation

## 2018-03-08 DIAGNOSIS — M5417 Radiculopathy, lumbosacral region: Secondary | ICD-10-CM | POA: Diagnosis not present

## 2018-03-08 DIAGNOSIS — M5416 Radiculopathy, lumbar region: Secondary | ICD-10-CM | POA: Insufficient documentation

## 2018-03-08 DIAGNOSIS — Z96651 Presence of right artificial knee joint: Secondary | ICD-10-CM | POA: Insufficient documentation

## 2018-03-08 DIAGNOSIS — T8484XA Pain due to internal orthopedic prosthetic devices, implants and grafts, initial encounter: Secondary | ICD-10-CM | POA: Diagnosis not present

## 2018-03-08 DIAGNOSIS — Z79899 Other long term (current) drug therapy: Secondary | ICD-10-CM | POA: Diagnosis not present

## 2018-03-08 DIAGNOSIS — M48061 Spinal stenosis, lumbar region without neurogenic claudication: Secondary | ICD-10-CM | POA: Diagnosis not present

## 2018-03-08 DIAGNOSIS — M9983 Other biomechanical lesions of lumbar region: Secondary | ICD-10-CM | POA: Diagnosis not present

## 2018-03-08 DIAGNOSIS — I10 Essential (primary) hypertension: Secondary | ICD-10-CM | POA: Insufficient documentation

## 2018-03-08 DIAGNOSIS — Z9889 Other specified postprocedural states: Secondary | ICD-10-CM | POA: Diagnosis not present

## 2018-03-08 DIAGNOSIS — Z87891 Personal history of nicotine dependence: Secondary | ICD-10-CM | POA: Insufficient documentation

## 2018-03-08 DIAGNOSIS — Z419 Encounter for procedure for purposes other than remedying health state, unspecified: Secondary | ICD-10-CM

## 2018-03-08 HISTORY — PX: LUMBAR LAMINECTOMY/DECOMPRESSION MICRODISCECTOMY: SHX5026

## 2018-03-08 LAB — COMPREHENSIVE METABOLIC PANEL
ALT: 16 U/L (ref 0–44)
AST: 17 U/L (ref 15–41)
Albumin: 3.3 g/dL — ABNORMAL LOW (ref 3.5–5.0)
Alkaline Phosphatase: 89 U/L (ref 38–126)
Anion gap: 7 (ref 5–15)
BUN: 9 mg/dL (ref 8–23)
CO2: 25 mmol/L (ref 22–32)
Calcium: 9.2 mg/dL (ref 8.9–10.3)
Chloride: 111 mmol/L (ref 98–111)
Creatinine, Ser: 0.92 mg/dL (ref 0.61–1.24)
GFR calc Af Amer: >60 mL/min (ref >60)
GFR calc non Af Amer: >60 mL/min (ref >60)
Glucose, Bld: 104 mg/dL — ABNORMAL HIGH (ref 70–99)
Potassium: 3.8 mmol/L (ref 3.5–5.1)
Sodium: 143 mmol/L (ref 135–145)
Total Bilirubin: 0.4 mg/dL (ref 0.3–1.2)
Total Protein: 6.4 g/dL — ABNORMAL LOW (ref 6.5–8.1)

## 2018-03-08 LAB — CBC WITH DIFFERENTIAL/PLATELET
Abs Immature Granulocytes: 0.06 10*3/uL (ref 0.00–0.07)
Basophils Absolute: 0 10*3/uL (ref 0.0–0.1)
Basophils Relative: 0 %
Eosinophils Absolute: 0.3 10*3/uL (ref 0.0–0.5)
Eosinophils Relative: 3 %
HCT: 35.1 % — ABNORMAL LOW (ref 39.0–52.0)
Hemoglobin: 11.5 g/dL — ABNORMAL LOW (ref 13.0–17.0)
Immature Granulocytes: 1 %
Lymphocytes Relative: 8 %
Lymphs Abs: 0.9 10*3/uL (ref 0.7–4.0)
MCH: 31.5 pg (ref 26.0–34.0)
MCHC: 32.8 g/dL (ref 30.0–36.0)
MCV: 96.2 fL (ref 80.0–100.0)
Monocytes Absolute: 0.8 10*3/uL (ref 0.1–1.0)
Monocytes Relative: 7 %
Neutro Abs: 9 10*3/uL — ABNORMAL HIGH (ref 1.7–7.7)
Neutrophils Relative %: 81 %
Platelets: 398 10*3/uL (ref 150–400)
RBC: 3.65 MIL/uL — ABNORMAL LOW (ref 4.22–5.81)
RDW: 13.2 % (ref 11.5–15.5)
WBC: 11.2 10*3/uL — ABNORMAL HIGH (ref 4.0–10.5)
nRBC: 0 % (ref 0.0–0.2)

## 2018-03-08 LAB — TYPE AND SCREEN
ABO/RH(D): O POS
Antibody Screen: NEGATIVE

## 2018-03-08 LAB — PROTIME-INR
INR: 1.13
Prothrombin Time: 14.4 seconds (ref 11.4–15.2)

## 2018-03-08 LAB — APTT: APTT: 32 s (ref 24–36)

## 2018-03-08 SURGERY — LUMBAR LAMINECTOMY/DECOMPRESSION MICRODISCECTOMY 2 LEVELS
Anesthesia: General | Laterality: Left

## 2018-03-08 MED ORDER — HYDROMORPHONE HCL 1 MG/ML IJ SOLN: 0.2500 mg | INTRAMUSCULAR | Status: DC | PRN

## 2018-03-08 MED ORDER — BUPIVACAINE LIPOSOME 1.3 % IJ SUSP
20.0000 mL | Freq: Once | INTRAMUSCULAR | Status: DC
  Filled 2018-03-08: qty 20

## 2018-03-08 MED ORDER — DEXAMETHASONE SODIUM PHOSPHATE 10 MG/ML IJ SOLN
INTRAMUSCULAR | Status: DC | PRN
  Administered 2018-03-08: 10 mg via INTRAVENOUS

## 2018-03-08 MED ORDER — PROPOFOL 10 MG/ML IV BOLUS
INTRAVENOUS | Status: DC | PRN
  Administered 2018-03-08: 200 mg via INTRAVENOUS

## 2018-03-08 MED ORDER — SUGAMMADEX SODIUM 200 MG/2ML IV SOLN
INTRAVENOUS | Status: DC | PRN
  Administered 2018-03-08: 200 mg via INTRAVENOUS

## 2018-03-08 MED ORDER — BUPIVACAINE LIPOSOME 1.3 % IJ SUSP
INTRAMUSCULAR | Status: DC | PRN
  Administered 2018-03-08: 20 mL

## 2018-03-08 MED ORDER — LIDOCAINE HCL (CARDIAC) PF 100 MG/5ML IV SOSY
PREFILLED_SYRINGE | INTRAVENOUS | Status: DC | PRN
  Administered 2018-03-08: 80 mg via INTRAVENOUS

## 2018-03-08 MED ORDER — PHENYLEPHRINE 40 MCG/ML (10ML) SYRINGE FOR IV PUSH (FOR BLOOD PRESSURE SUPPORT)
PREFILLED_SYRINGE | INTRAVENOUS | Status: DC | PRN
  Administered 2018-03-08: 40 ug via INTRAVENOUS
  Administered 2018-03-08 (×3): 80 ug via INTRAVENOUS

## 2018-03-08 MED ORDER — BUPIVACAINE-EPINEPHRINE (PF) 0.25% -1:200000 IJ SOLN
INTRAMUSCULAR | Status: AC
  Filled 2018-03-08: qty 30

## 2018-03-08 MED ORDER — THROMBIN 20000 UNITS EX SOLR
CUTANEOUS | Status: AC
  Filled 2018-03-08: qty 20000

## 2018-03-08 MED ORDER — BUPIVACAINE-EPINEPHRINE 0.25% -1:200000 IJ SOLN
INTRAMUSCULAR | Status: DC | PRN
  Administered 2018-03-08: 8 mL

## 2018-03-08 MED ORDER — FENTANYL CITRATE (PF) 250 MCG/5ML IJ SOLN
INTRAMUSCULAR | Status: AC
  Filled 2018-03-08: qty 5

## 2018-03-08 MED ORDER — CEFAZOLIN SODIUM-DEXTROSE 2-4 GM/100ML-% IV SOLN
2.0000 g | INTRAVENOUS | Status: AC
  Administered 2018-03-08: 2 g via INTRAVENOUS
  Filled 2018-03-08: qty 100

## 2018-03-08 MED ORDER — EPHEDRINE 5 MG/ML INJ
INTRAVENOUS | Status: AC
  Filled 2018-03-08: qty 10

## 2018-03-08 MED ORDER — DEXAMETHASONE SODIUM PHOSPHATE 10 MG/ML IJ SOLN
INTRAMUSCULAR | Status: AC
  Filled 2018-03-08: qty 1

## 2018-03-08 MED ORDER — LACTATED RINGERS IV SOLN
INTRAVENOUS | Status: DC
  Administered 2018-03-08 (×2): via INTRAVENOUS

## 2018-03-08 MED ORDER — FENTANYL CITRATE (PF) 250 MCG/5ML IJ SOLN
INTRAMUSCULAR | Status: DC | PRN
  Administered 2018-03-08 (×2): 50 ug via INTRAVENOUS
  Administered 2018-03-08: 100 ug via INTRAVENOUS
  Administered 2018-03-08: 50 ug via INTRAVENOUS

## 2018-03-08 MED ORDER — ACETAMINOPHEN 500 MG PO TABS
ORAL_TABLET | ORAL | Status: AC
  Administered 2018-03-08: 1000 mg
  Filled 2018-03-08: qty 2

## 2018-03-08 MED ORDER — POVIDONE-IODINE 7.5 % EX SOLN: Freq: Once | CUTANEOUS | Status: DC

## 2018-03-08 MED ORDER — PROPOFOL 10 MG/ML IV BOLUS
INTRAVENOUS | Status: AC
  Filled 2018-03-08: qty 20

## 2018-03-08 MED ORDER — PROMETHAZINE HCL 25 MG/ML IJ SOLN: 6.2500 mg | INTRAMUSCULAR | Status: DC | PRN

## 2018-03-08 MED ORDER — MIDAZOLAM HCL 2 MG/2ML IJ SOLN
INTRAMUSCULAR | Status: AC
  Filled 2018-03-08: qty 2

## 2018-03-08 MED ORDER — INDOCYANINE GREEN 25 MG IV SOLR
INTRAVENOUS | Status: AC
  Filled 2018-03-08: qty 25

## 2018-03-08 MED ORDER — ROCURONIUM BROMIDE 50 MG/5ML IV SOSY
PREFILLED_SYRINGE | INTRAVENOUS | Status: AC
  Filled 2018-03-08: qty 5

## 2018-03-08 MED ORDER — ONDANSETRON HCL 4 MG/2ML IJ SOLN
INTRAMUSCULAR | Status: AC
  Filled 2018-03-08: qty 2

## 2018-03-08 MED ORDER — SODIUM CHLORIDE 0.9 % IV SOLN
INTRAVENOUS | Status: DC | PRN
  Administered 2018-03-08: 25 ug/min via INTRAVENOUS

## 2018-03-08 MED ORDER — ROCURONIUM BROMIDE 100 MG/10ML IV SOLN
INTRAVENOUS | Status: DC | PRN
  Administered 2018-03-08: 10 mg via INTRAVENOUS
  Administered 2018-03-08: 50 mg via INTRAVENOUS
  Administered 2018-03-08: 10 mg via INTRAVENOUS

## 2018-03-08 MED ORDER — SCOPOLAMINE 1 MG/3DAYS TD PT72
MEDICATED_PATCH | TRANSDERMAL | Status: AC
  Administered 2018-03-08: 13:00:00
  Filled 2018-03-08: qty 1

## 2018-03-08 MED ORDER — 0.9 % SODIUM CHLORIDE (POUR BTL) OPTIME
TOPICAL | Status: DC | PRN
  Administered 2018-03-08: 1000 mL

## 2018-03-08 MED ORDER — ACETAMINOPHEN 500 MG PO TABS: 1000.0000 mg | ORAL_TABLET | Freq: Once | ORAL | Status: DC

## 2018-03-08 MED ORDER — ONDANSETRON HCL 4 MG/2ML IJ SOLN
INTRAMUSCULAR | Status: DC | PRN
  Administered 2018-03-08: 4 mg via INTRAVENOUS

## 2018-03-08 MED ORDER — THROMBIN 20000 UNITS EX SOLR
CUTANEOUS | Status: DC | PRN
  Administered 2018-03-08: 16:00:00 via TOPICAL

## 2018-03-08 MED ORDER — HEMOSTATIC AGENTS (NO CHARGE) OPTIME
TOPICAL | Status: DC | PRN
  Administered 2018-03-08: 1 via TOPICAL

## 2018-03-08 MED ORDER — SCOPOLAMINE 1 MG/3DAYS TD PT72: 1.0000 | MEDICATED_PATCH | TRANSDERMAL | Status: DC

## 2018-03-08 MED ORDER — EPHEDRINE SULFATE-NACL 50-0.9 MG/10ML-% IV SOSY
PREFILLED_SYRINGE | INTRAVENOUS | Status: DC | PRN
  Administered 2018-03-08: 5 mg via INTRAVENOUS

## 2018-03-08 MED ORDER — MIDAZOLAM HCL 2 MG/2ML IJ SOLN
INTRAMUSCULAR | Status: DC | PRN
  Administered 2018-03-08: 2 mg via INTRAVENOUS

## 2018-03-08 MED ORDER — LIDOCAINE 2% (20 MG/ML) 5 ML SYRINGE
INTRAMUSCULAR | Status: AC
  Filled 2018-03-08: qty 5

## 2018-03-08 MED ORDER — METHYLPREDNISOLONE ACETATE 40 MG/ML IJ SUSP
INTRAMUSCULAR | Status: AC
  Filled 2018-03-08: qty 1

## 2018-03-08 MED ORDER — PHENYLEPHRINE 40 MCG/ML (10ML) SYRINGE FOR IV PUSH (FOR BLOOD PRESSURE SUPPORT)
PREFILLED_SYRINGE | INTRAVENOUS | Status: AC
  Filled 2018-03-08: qty 10

## 2018-03-08 SURGICAL SUPPLY — 81 items
BENZOIN TINCTURE PRP APPL 2/3 (GAUZE/BANDAGES/DRESSINGS) ×3 IMPLANT
BUR PRECISION FLUTE 5.0 (BURR) ×3 IMPLANT
CABLE BIPOLOR RESECTION CORD (MISCELLANEOUS) ×3 IMPLANT
CANISTER SUCT 3000ML PPV (MISCELLANEOUS) ×3 IMPLANT
CARTRIDGE OIL MAESTRO DRILL (MISCELLANEOUS) ×1 IMPLANT
CLOSURE WOUND 1/2 X4 (GAUZE/BANDAGES/DRESSINGS) ×1
CONT SPEC 4OZ CLIKSEAL STRL BL (MISCELLANEOUS) ×3 IMPLANT
COVER BACK TABLE 60X90IN (DRAPES) ×3 IMPLANT
COVER SURGICAL LIGHT HANDLE (MISCELLANEOUS) IMPLANT
COVER WAND RF STERILE (DRAPES) IMPLANT
DIFFUSER DRILL AIR PNEUMATIC (MISCELLANEOUS) ×3 IMPLANT
DRAIN CHANNEL 15F RND FF W/TCR (WOUND CARE) IMPLANT
DRAPE C-ARM 42X72 X-RAY (DRAPES) ×3 IMPLANT
DRAPE POUCH INSTRU U-SHP 10X18 (DRAPES) IMPLANT
DRAPE SURG 17X23 STRL (DRAPES) ×12 IMPLANT
DURAPREP 26ML APPLICATOR (WOUND CARE) ×3 IMPLANT
ELECT BLADE 4.0 EZ CLEAN MEGAD (MISCELLANEOUS) ×3
ELECT BLADE 6.5 EXT (BLADE) ×3 IMPLANT
ELECT CAUTERY BLADE 6.4 (BLADE) IMPLANT
ELECT REM PT RETURN 9FT ADLT (ELECTROSURGICAL) ×3
ELECTRODE BLDE 4.0 EZ CLN MEGD (MISCELLANEOUS) ×1 IMPLANT
ELECTRODE REM PT RTRN 9FT ADLT (ELECTROSURGICAL) ×1 IMPLANT
EVACUATOR SILICONE 100CC (DRAIN) IMPLANT
FILTER STRAW FLUID ASPIR (MISCELLANEOUS) IMPLANT
GAUZE 4X4 16PLY RFD (DISPOSABLE) IMPLANT
GAUZE SPONGE 4X4 12PLY STRL (GAUZE/BANDAGES/DRESSINGS) IMPLANT
GAUZE SPONGE 4X4 12PLY STRL LF (GAUZE/BANDAGES/DRESSINGS) ×3 IMPLANT
GLOVE BIO SURGEON STRL SZ7 (GLOVE) ×6 IMPLANT
GLOVE BIO SURGEON STRL SZ8 (GLOVE) ×6 IMPLANT
GLOVE BIOGEL PI IND STRL 7.0 (GLOVE) ×1 IMPLANT
GLOVE BIOGEL PI IND STRL 8 (GLOVE) ×1 IMPLANT
GLOVE BIOGEL PI INDICATOR 7.0 (GLOVE) ×2
GLOVE BIOGEL PI INDICATOR 8 (GLOVE) ×2
GOWN STRL REUS W/ TWL LRG LVL3 (GOWN DISPOSABLE) ×1 IMPLANT
GOWN STRL REUS W/ TWL XL LVL3 (GOWN DISPOSABLE) ×1 IMPLANT
GOWN STRL REUS W/TWL LRG LVL3 (GOWN DISPOSABLE) ×2
GOWN STRL REUS W/TWL XL LVL3 (GOWN DISPOSABLE) ×2
HANDPIECE INTERPULSE COAX TIP (DISPOSABLE) ×2
IV CATH 14GX2 1/4 (CATHETERS) ×3 IMPLANT
IV NS 1000ML (IV SOLUTION) ×2
IV NS 1000ML BAXH (IV SOLUTION) ×1 IMPLANT
KIT BASIN OR (CUSTOM PROCEDURE TRAY) ×3 IMPLANT
KIT TURNOVER KIT B (KITS) ×3 IMPLANT
NEEDLE 18GX1X1/2 (RX/OR ONLY) (NEEDLE) ×3 IMPLANT
NEEDLE 22X1 1/2 (OR ONLY) (NEEDLE) ×3 IMPLANT
NEEDLE HYPO 18GX1.5 BLUNT FILL (NEEDLE) ×3 IMPLANT
NEEDLE HYPO 25GX1X1/2 BEV (NEEDLE) ×3 IMPLANT
NEEDLE SPNL 18GX3.5 QUINCKE PK (NEEDLE) ×6 IMPLANT
NS IRRIG 1000ML POUR BTL (IV SOLUTION) ×3 IMPLANT
OIL CARTRIDGE MAESTRO DRILL (MISCELLANEOUS) ×3
PACK LAMINECTOMY NEURO (CUSTOM PROCEDURE TRAY) ×3 IMPLANT
PACK LAMINECTOMY ORTHO (CUSTOM PROCEDURE TRAY) IMPLANT
PACK UNIVERSAL I (CUSTOM PROCEDURE TRAY) ×3 IMPLANT
PAD ARMBOARD 7.5X6 YLW CONV (MISCELLANEOUS) ×6 IMPLANT
PATTIES SURGICAL .5 X.5 (GAUZE/BANDAGES/DRESSINGS) IMPLANT
PATTIES SURGICAL .5 X1 (DISPOSABLE) ×3 IMPLANT
SET HNDPC FAN SPRY TIP SCT (DISPOSABLE) ×1 IMPLANT
SPONGE SURGIFOAM ABS GEL 100 (HEMOSTASIS) ×3 IMPLANT
STRIP CLOSURE SKIN 1/2X4 (GAUZE/BANDAGES/DRESSINGS) ×2 IMPLANT
SURGIFLO W/THROMBIN 8M KIT (HEMOSTASIS) ×3 IMPLANT
SUT ETHILON 2 0 FS 18 (SUTURE) IMPLANT
SUT MNCRL AB 3-0 PS2 18 (SUTURE) ×3 IMPLANT
SUT VIC AB 0 CT1 27 (SUTURE)
SUT VIC AB 0 CT1 27XBRD ANBCTR (SUTURE) IMPLANT
SUT VIC AB 1 CT1 18XCR BRD 8 (SUTURE) ×2 IMPLANT
SUT VIC AB 1 CT1 8-18 (SUTURE) ×4
SUT VIC AB 2-0 CT2 18 VCP726D (SUTURE) ×6 IMPLANT
SYR 20CC LL (SYRINGE) ×3 IMPLANT
SYR 30ML LL (SYRINGE) ×3 IMPLANT
SYR 50ML SLIP (SYRINGE) IMPLANT
SYR BULB IRRIGATION 50ML (SYRINGE) ×3 IMPLANT
SYR CONTROL 10ML LL (SYRINGE) ×3 IMPLANT
SYR TB 1ML 26GX3/8 SAFETY (SYRINGE) IMPLANT
SYR TB 1ML LUER SLIP (SYRINGE) IMPLANT
TAPE CLOTH SURG 4X10 WHT LF (GAUZE/BANDAGES/DRESSINGS) ×3 IMPLANT
TOWEL GREEN STERILE (TOWEL DISPOSABLE) ×3 IMPLANT
TOWEL GREEN STERILE FF (TOWEL DISPOSABLE) ×3 IMPLANT
TUBE CONNECTING 12'X1/4 (SUCTIONS) ×1
TUBE CONNECTING 12X1/4 (SUCTIONS) ×2 IMPLANT
WATER STERILE IRR 1000ML POUR (IV SOLUTION) ×3 IMPLANT
YANKAUER SUCT BULB TIP NO VENT (SUCTIONS) ×3 IMPLANT

## 2018-03-08 NOTE — Op Note (Signed)
PATIENT NAME: Manuel Hood   MEDICAL RECORD NO.:   [id number]    PHYSICIAN:  Phylliss Bob, MD      DATE OF BIRTH: [birth year]   DATE OF PROCEDURE: 03/08/2018                               OPERATIVE REPORT   PREOPERATIVE DIAGNOSES: 1. S/p revision L3/4 and L4/5 anterior/posterior fusion procedure with instrumentation with postoperative development of increasing left leg pain consistent with lumabr radiculopathy. 2. CT evidence of left L4/5 neuroforaminal stenosis (patient has transitional anatomy, so this level can also be referred to as the L5/S1 level)  POSTOPERATIVE DIAGNOSES: 1. S/p revision L3/4 and L4/5 anterior/posterior fusion procedure with instrumentation with postoperative development of increasing left leg pain consistent with lumabr radiculopathy. 2. CT evidence of left L4/5 neuroforaminal stenosis (patient has transitional anatomy, so this level can also be referred to as the L5/S1 level)  PROCEDURE:    1. Left L4-5 decompression, including full facetectomy and decompression of exiting left L4 nerve (patient has transitional anatomy, so this level can also be referred to as the L5/S1 level) 2. Removal and reinsertion of left-sided spinal fixation (left L5 screw and interconnecting rod)  SURGEON:  Phylliss Bob, MD.  ASSISTANTPricilla Holm, PA-C.  ANESTHESIA:  General endotracheal anesthesia.  COMPLICATIONS:  None.  DISPOSITION:  Stable.  ESTIMATED BLOOD LOSS:  Minimal.  INDICATIONS FOR SURGERY:  Briefly, Manuel Hood is a very pleasant 68- year-old male, who is status post the procedure noted above.  Postoperatively, he did well with resolution of his preoperative right leg pain.  He did however complain of pain in his left foot.  Initially, this was minimal.  Over time, this progressed into swelling and rather severe pain in the left leg.  I did work him up for a DVT, but his work-up was negative.  A CAT scan was also obtained, which was negative for pelvic DVT.  He  was evaluated by the approach surgeon, Dr. Donnetta Hutching, who did review a CAT scan with the patient, which was notable for a hematoma, but it was not felt that his leg pain and swelling was due to a vascular etiology.  Upon additional review of his CAT scan, I did identify an increase in the patient's neuroforaminal stenosis.  Specifically, there was a slight retrolisthesis of L4 on L5, which did appear to be causing neuroforaminal narrowing.  With vascular etiologies ruled out, this was the only finding that I was able to identify that would be thought to explain the patient's increasing and ultimately severe left leg pain.  Given this, I did discuss proceeding with the procedure noted above, in order to thoroughly and completely decompress the foramen and the exiting left L4 nerve.  The patient did wish to proceed.  OPERATIVE DETAILS:  On 03/08/2018, the patient was brought to surgery and general endotracheal anesthesia was administered.  The patient was placed prone on a well-padded flat Jackson bed with a spinal frame. Antibiotics were given and the back was prepped and draped in the usual sterile fashion.  A time-out procedure was performed.  I then made a midline incision overlying the L3-4 and L4-5 intervertebral spaces.  The fascia was incised at the midline.  The paraspinal musculature was bluntly retracted laterally and held retracted with a self-retaining retractor.  At this point, the right and left pedicle screws and rods were noted.  Caps were removed  on the left side and the left sided interconnecting rod was removed.  The left L5 screw was also removed.  At this point, I did perform a thorough left-sided L4-5 facetectomy.  Specifically, the inferior articular process of L4 and the superior articular process of L5 were entirely removed.  The left L4 nerve was identified and was traced from the region of the spinal canal, out the foramen, and distal to it.  A Woodson probe did confirm that there was no  undue pressure on the nerve.  At this point, an 8 x 40 mm screw was replaced into the S1 vertebral body.  The left-sided interconnecting rod was then replaced, after which point caps were placed in a final locking procedure was performed. All bleeding was then controlled using bipolar electrocautery in addition to FloSeal.  At this point, I used pulsatile irrigation in order to thoroughly irrigate the wound to minimize risk of infection.  There was no extravasation of cerebrospinal fluid noted throughout the entire surgery.  At this point, the wound was closed in layers using #1 Vicryl, followed by 2-0 Vicryl, followed by 4- 0 Monocryl.  Benzoin and Steri-Strips were applied, followed by a sterile dressing.  All instrument counts were correct at the termination of the procedure.  Of note, Pricilla Holm, PA-C, was my assistant throughout surgery, and did aid in retraction, suctioning, and closure from start to finish.     Phylliss Bob, MD

## 2018-03-08 NOTE — Discharge Instructions (Signed)
Keep dressing dry while in place. May remove dressing after 5 days and may shower at that time.   Check site for signs of infection and call office if any occur:  Redness Swelling Fever > 101 Drainage Odor  No bending, lifting, or twisting.

## 2018-03-08 NOTE — Transfer of Care (Signed)
Immediate Anesthesia Transfer of Care Note  Patient: Manuel Hood  Procedure(s) Performed: LEFT LUMBAR FOUR-FIVE, LUMBAR FIVE-SACRAL ONE DECOMPRESSION (Left )  Patient Location: PACU  Anesthesia Type:General  Level of Consciousness: drowsy and patient cooperative  Airway & Oxygen Therapy: Patient Spontanous Breathing and Patient connected to nasal cannula oxygen  Post-op Assessment: Report given to RN and Post -op Vital signs reviewed and stable  Post vital signs: Reviewed and stable  Last Vitals:  Vitals Value Taken Time  BP 125/77 03/08/2018  5:10 PM  Temp    Pulse 97 03/08/2018  5:12 PM  Resp 17 03/08/2018  5:12 PM  SpO2 100 % 03/08/2018  5:12 PM  Vitals shown include unvalidated device data.  Last Pain:  Vitals:   03/08/18 1246  TempSrc:   PainSc: 9       Patients Stated Pain Goal: 3 (02/33/43 5686)  Complications: No apparent anesthesia complications

## 2018-03-08 NOTE — Anesthesia Procedure Notes (Signed)
Procedure Name: Intubation Date/Time: 03/08/2018 2:48 PM Performed by: Raenette Rover, CRNA Pre-anesthesia Checklist: Patient identified, Emergency Drugs available, Suction available and Patient being monitored Patient Re-evaluated:Patient Re-evaluated prior to induction Oxygen Delivery Method: Circle system utilized Preoxygenation: Pre-oxygenation with 100% oxygen Induction Type: IV induction Ventilation: Mask ventilation without difficulty Laryngoscope Size: Mac and 4 Grade View: Grade II Tube type: Oral Tube size: 7.5 mm Number of attempts: 1 Airway Equipment and Method: Stylet Placement Confirmation: ETT inserted through vocal cords under direct vision,  positive ETCO2,  CO2 detector and breath sounds checked- equal and bilateral Secured at: 23 cm Tube secured with: Tape Dental Injury: Teeth and Oropharynx as per pre-operative assessment

## 2018-03-08 NOTE — H&P (Signed)
PREOPERATIVE H&P  Chief Complaint: Left leg pain  HPI: Manuel Hood is a 68 y.o. male who presents with ongoing pain in the left leg.  Currently, patient is about 2 weeks after surgery.  He initially did well after surgery, noting some minimal discomfort in the left foot.  Over the last 2 weeks, this discomfort did significantly progressed to pain involving the entire left leg.  He did also report left leg swelling.  I did have significant concerns for a deep vein thrombosis.  A left lower extremity ultrasound was negative for DVT.  Per the radiologist recommendations, I then proceeded with a CAT scan with IV contrast.  This did rule out a pelvic DVT, but was notable for a retroperitoneal hematoma.  The patient was then evaluated by Dr. Donnetta Hutching.  Specifically, I did wish to obtain input from him as to whether the patient's symptoms may potentially be attributable to occlusion of his left iliac veins.  Dr. Donnetta Hutching did kindly evaluate the patient and call me subsequent to his evaluation.  He did express to me that he did not feel that the patient's symptoms were related to any vascular pathology.  At this point, I then again scrutinized the patient's CAT scan, and upon my additional review of the patient's CAT scan, I did identify that there is neuroforaminal narrowing on the left side at the L4-5 level.  It does appear that when I compare the patient's postoperative CAT scan to his preoperative CAT scan, the neuroforaminal narrowing may have actually increased, likely secondary to placement of the patient's intervertebral implant anteriorly, which it appears did cause a slight retrolisthesis of L4 on L5 on the left side, which I did feel may be resulting in left-sided L4 radiculopathy.  I did telephone the patient and share these thoughts with him, and I did express to him that I really had no other explanation of why he might be having his ongoing and increasing left leg pain.  I therefore did discuss  proceeding with removal of his left-sided instrumentation, with an L4-5 decompression, including a full facetectomy on the left at L4-5, followed by replacement of his instrumentation.  The patient did wish to proceed.  I did extensively discuss the risks of surgery with the patient, including infection, lack of resolution of his left leg pain, neurovascular injury, cerebrospinal fluid leak, as well as neurologic injury.  I did however specifically report to him that I do feel that proceeding with the decompression noted above I do feel has with a 80-90 likelihood of addressing his left leg pain, as I have no other way to explain it other than the findings outlined above.  Again, the patient did express interest in proceeding.    Patient has failed multiple forms of conservative care and continues to have pain (see office notes for additional details regarding the patient's full course of treatment)  Past Medical History:  Diagnosis Date  . Arthritis   . GERD (gastroesophageal reflux disease)    rare  . Hypertension   . Low back pain   . Right leg pain   . Seasonal allergies   . Sinus pain    Past Surgical History:  Procedure Laterality Date  . ABDOMINAL EXPOSURE N/A 02/22/2018   Procedure: ABDOMINAL EXPOSURE;  Surgeon: Rosetta Posner, MD;  Location: [facility] OR;  Service: Vascular;  Laterality: N/A;  . ANTERIOR LUMBAR FUSION N/A 02/22/2018   Procedure: POSTERIOR REMOVAL OF INSTRUMENTATION . ANTERIOR LUMBAR INTERBODY FUSION  LUMBAR 3-4, LUMBAR 4-5 WITH INSTRUMENTATION AND ALLOGRAFT;  Surgeon: Phylliss Bob, MD;  Location: [place];  Service: Orthopedics;  Laterality: N/A;  . APPENDECTOMY    . BACK SURGERY     x2 (cervical and lumbar)  . COLONOSCOPY W/ POLYPECTOMY    . JOINT REPLACEMENT     Right-full Left- partial  . KNEE ARTHROSCOPY  rt knee x3  . KNEE ARTHROSCOPY WITH MEDIAL MENISECTOMY Left 06/29/2012   Procedure: LEFT KNEE ARTHROSCOPY WITH DEBRIDEMENT AND MENISECTOMY;  Surgeon: Mauri Pole,  MD;  Location: [facility];  Service: Orthopedics;  Laterality: Left;  . ROTATOR CUFF REPAIR Right rt shoulder  . TONSILLECTOMY    . TOTAL KNEE ARTHROPLASTY  02/04/2011   Procedure: TOTAL KNEE ARTHROPLASTY;  Surgeon: Johnn Hai;  Location: [facility];  Service: Orthopedics;  Laterality: Right;  . TOTAL KNEE REVISION Right 06/29/2012   Procedure: REVISION RIGHT TOTAL KNEE/REVISION FEMORAL COMPONENT POLY EXCHANGE/PATELLA LATERAL FACETECTOMY;  Surgeon: Mauri Pole, MD;  Location: [facility];  Service: Orthopedics;  Laterality: Right;   Social History   Socioeconomic History  . Marital status: Married    Spouse name: Not on file  . Number of children: Not on file  . Years of education: Not on file  . Highest education level: Not on file  Occupational History  . Not on file  Social Needs  . Financial resource strain: Not on file  . Food insecurity:    Worry: Not on file    Inability: Not on file  . Transportation needs:    Medical: Not on file    Non-medical: Not on file  Tobacco Use  . Smoking status: Former Smoker    Years: 22.00    Types: Cigarettes    Last attempt to quit: 02/02/1996    Years since quitting: 22.1  . Smokeless tobacco: Never Used  Substance and Sexual Activity  . Alcohol use: Not Currently  . Drug use: No  . Sexual activity: Not on file  Lifestyle  . Physical activity:    Days per week: Not on file    Minutes per session: Not on file  . Stress: Not on file  Relationships  . Social connections:    Talks on phone: Not on file    Gets together: Not on file    Attends religious service: Not on file    Active member of club or organization: Not on file    Attends meetings of clubs or organizations: Not on file    Relationship status: Not on file  Other Topics Concern  . Not on file  Social History Narrative  . Not on file   Family History  Problem Relation Age of Onset  . High blood pressure Mother   . Glaucoma Mother    No Known Allergies Prior to  Admission medications   Medication Sig Start Date End Date Taking? Authorizing Provider  calcium carbonate (OS-CAL) 600 MG TABS Take 600 mg by mouth daily.      [provider]  Cholecalciferol (D3-1000) 1000 units tablet Take 1,000 Units by mouth daily.    [provider]  diazepam (VALIUM) 5 MG tablet  03/04/18   [provider]  docusate sodium 100 MG CAPS Take 100 mg by mouth 2 (two) times daily. Patient taking differently: Take 100 mg by mouth 3 (three) times daily.  06/30/12   Danae Orleans, PA-C  fluticasone (FLONASE) 50 MCG/ACT nasal spray Place 2 sprays into both nostrils daily as needed for  allergies or rhinitis.     [provider]  losartan-hydrochlorothiazide (HYZAAR) 100-12.5 MG tablet Take 1 tablet by mouth daily.    [provider]  methylPREDNISolone (MEDROL DOSEPAK) 4 MG TBPK tablet  10/17/17   [provider]  Multiple Vitamins-Minerals (ONE-A-DAY 50 PLUS PO) Take 1 tablet by mouth daily.      [provider]  oxyCODONE-acetaminophen (PERCOCET/ROXICET) 5-325 MG tablet  03/06/18   [provider]  pregabalin (LYRICA) 75 MG capsule Take 75 mg by mouth 2 (two) times daily.    [provider]  vitamin C (ASCORBIC ACID) 500 MG tablet Take 500 mg by mouth daily.    [provider]     All other systems have been reviewed and were otherwise negative with the exception of those mentioned in the HPI and as above.  Physical Exam: There were no vitals filed for this visit.  There is no height or weight on file to calculate BMI.  General: Alert, no acute distress Cardiovascular: No pedal edema Respiratory: No cyanosis, no use of accessory musculature Skin: Patient has a well-healed posterior midline incision. Neurologic: Sensation intact distally Psychiatric: Patient is competent for consent with normal mood and affect Lymphatic: No axillary or cervical lymphadenopathy  MUSCULOSKELETAL: + SLR  on the left  Assessment/Plan: LEFT RADICULOPATHY Plan for Procedure(s): LEFT LUMBAR 4-5 DECOMPRESSION WITH REMOVAL AND REINSERTION OF POSTERIOR SPINAL FIXATION   Norva Karvonen, MD 03/08/2018 11:23 AM

## 2018-03-08 NOTE — Anesthesia Preprocedure Evaluation (Addendum)
Anesthesia Evaluation  Patient identified by MRN, date of birth, ID band Patient awake    Reviewed: Allergy & Precautions, H&P , NPO status , Patient's Chart, lab work & pertinent test results  Airway Mallampati: III  TM Distance: >3 FB Neck ROM: Full    Dental no notable dental hx. (+) Teeth Intact, Dental Advisory Given   Pulmonary neg pulmonary ROS, former smoker,    Pulmonary exam normal breath sounds clear to auscultation       Cardiovascular Exercise Tolerance: Good hypertension, Pt. on medications  Rhythm:Regular Rate:Normal     Neuro/Psych negative neurological ROS  negative psych ROS   GI/Hepatic Neg liver ROS, GERD  ,  Endo/Other  negative endocrine ROS  Renal/GU negative Renal ROS  negative genitourinary   Musculoskeletal  (+) Arthritis , Osteoarthritis,    Abdominal   Peds  Hematology negative hematology ROS (+)   Anesthesia Other Findings   Reproductive/Obstetrics negative OB ROS                             Anesthesia Physical  Anesthesia Plan  ASA: II  Anesthesia Plan: General   Post-op Pain Management:    Induction: Intravenous  PONV Risk Score and Plan: 3 and Ondansetron, Dexamethasone and Scopolamine patch - Pre-op  Airway Management Planned: Oral ETT  Additional Equipment: Arterial line  Intra-op Plan:   Post-operative Plan: Extubation in OR  Informed Consent: I have reviewed the patients History and Physical, chart, labs and discussed the procedure including the risks, benefits and alternatives for the proposed anesthesia with the patient or authorized representative who has indicated his/her understanding and acceptance.     Dental advisory given  Plan Discussed with: CRNA and Anesthesiologist  Anesthesia Plan Comments:        Anesthesia Quick Evaluation

## 2018-03-08 NOTE — Anesthesia Postprocedure Evaluation (Signed)
Anesthesia Post Note  Patient: Manuel Hood  Procedure(s) Performed: LEFT LUMBAR FOUR-FIVE, LUMBAR FIVE-SACRAL ONE DECOMPRESSION (Left )     Patient location during evaluation: PACU Anesthesia Type: General Level of consciousness: sedated Pain management: pain level controlled Vital Signs Assessment: post-procedure vital signs reviewed and stable Respiratory status: spontaneous breathing and respiratory function stable Cardiovascular status: stable Postop Assessment: no apparent nausea or vomiting Anesthetic complications: no    Last Vitals:  Vitals:   03/08/18 1740 03/08/18 1755  BP: 139/82 (!) 143/74  Pulse: 90 93  Resp: 20 15  Temp:    SpO2: 99% 94%    Last Pain:  Vitals:   03/08/18 1710  TempSrc:   PainSc: Asleep                 Luciel Brickman DANIEL

## 2018-03-09 ENCOUNTER — Encounter (HOSPITAL_COMMUNITY): Payer: Self-pay | Admitting: Orthopedic Surgery

## 2018-03-09 ENCOUNTER — Other Ambulatory Visit: Payer: Medicare HMO

## 2018-03-10 DIAGNOSIS — Z4789 Encounter for other orthopedic aftercare: Secondary | ICD-10-CM | POA: Diagnosis not present

## 2018-03-10 DIAGNOSIS — R2681 Unsteadiness on feet: Secondary | ICD-10-CM | POA: Diagnosis not present

## 2018-03-10 DIAGNOSIS — Z981 Arthrodesis status: Secondary | ICD-10-CM | POA: Diagnosis not present

## 2018-03-10 DIAGNOSIS — I10 Essential (primary) hypertension: Secondary | ICD-10-CM | POA: Diagnosis not present

## 2018-03-10 DIAGNOSIS — M6281 Muscle weakness (generalized): Secondary | ICD-10-CM | POA: Diagnosis not present

## 2018-03-12 IMAGING — CT CT MAXILLOFACIAL W/O CM
3 of 5 series · 14 of 47 positions shown, 16 images · non-contrast
Comparison: None.

CLINICAL DATA: Headaches, facial pressure.

EXAM:
CT MAXILLOFACIAL WITHOUT CONTRAST
TECHNIQUE: Multidetector CT imaging of the maxillofacial structures was
performed. Multiplanar CT image reconstructions were also generated.

[Series 2: sinus 2.00 hr60 s3 ax · axial · 0.27mm/px · z∈[-961,-877]mm · 8 of 55 slices shown, 10 images]
[im 7/55  brain]
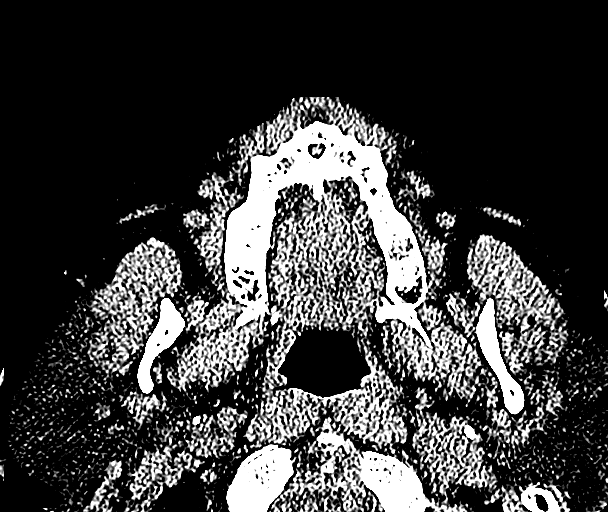
[im 7/55  bone]
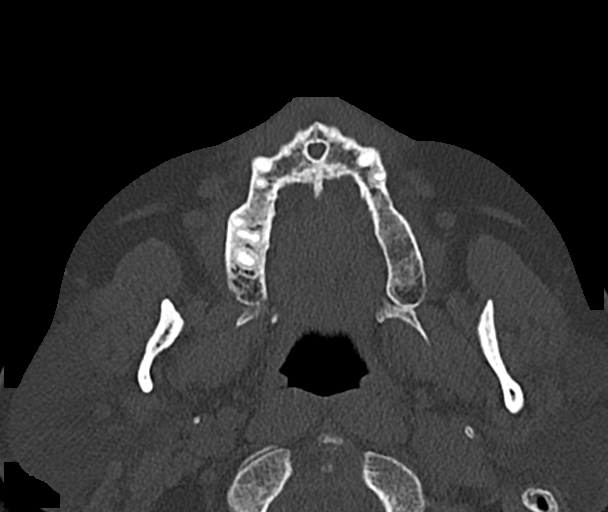
[im 13/55  bone]
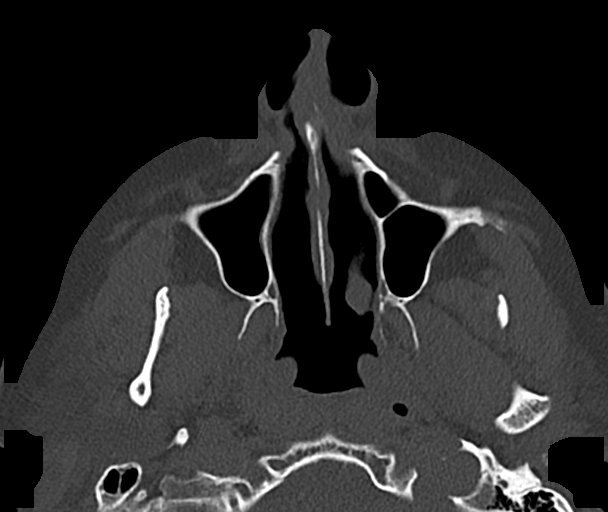
[im 19/55  bone]
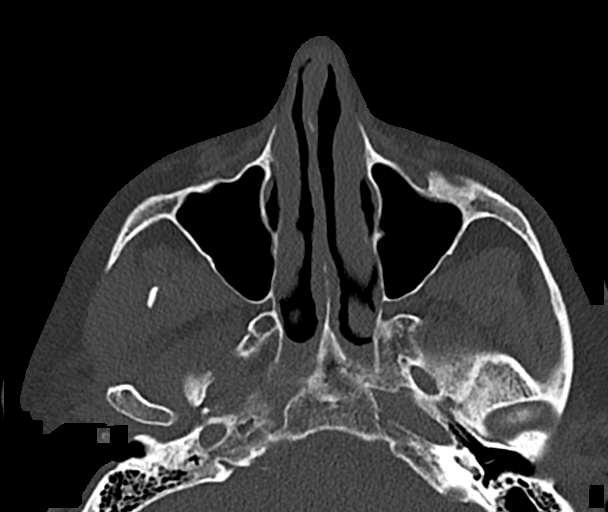
[im 25/55  bone]
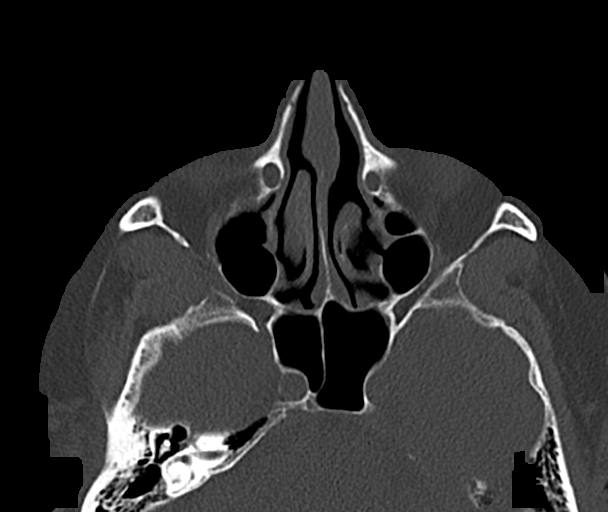
[im 31/55  brain]
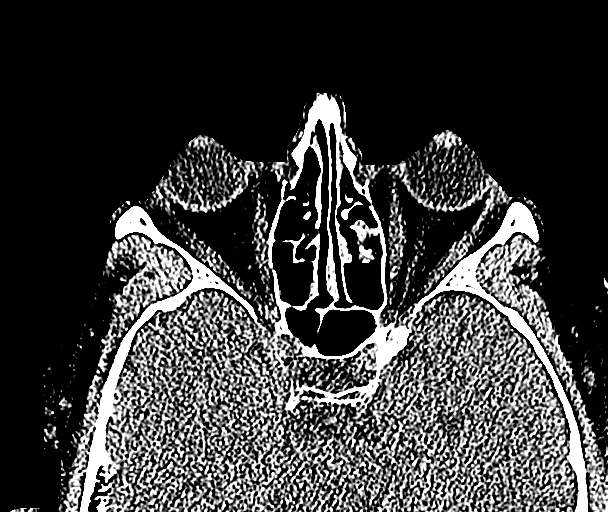
[im 31/55  bone]
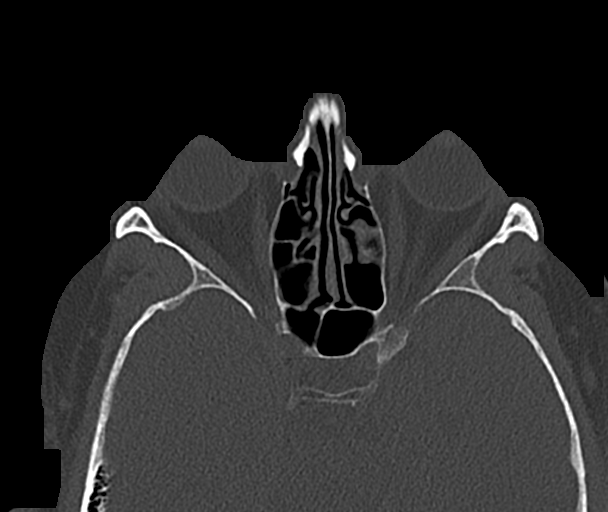
[im 37/55  bone]
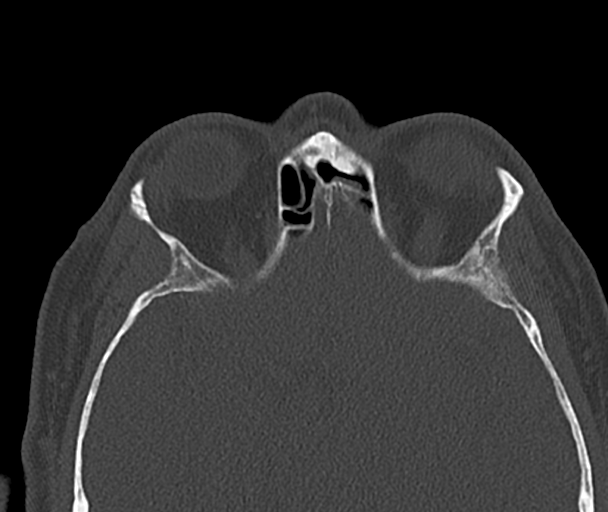
[im 43/55  bone]
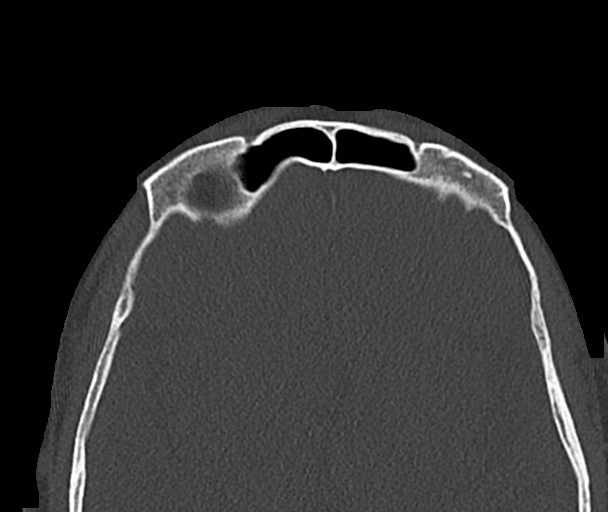
[im 49/55  bone]
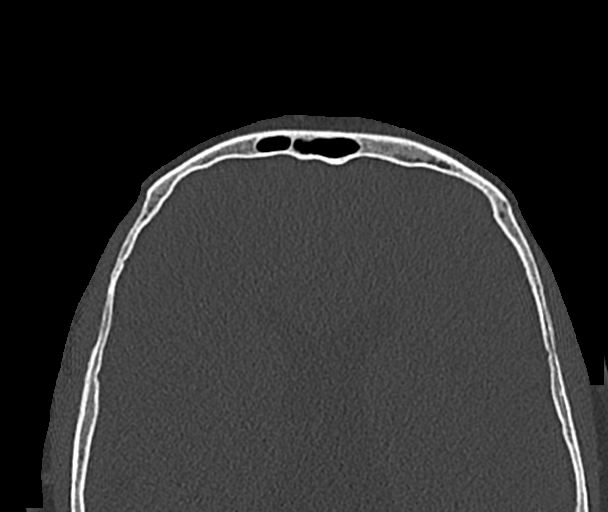

[Series 4: sinus 2.00 hr60 s3 cor · coronal · 0.22mm/px · 3 of 68 slices shown]
[im 23/68  bone]
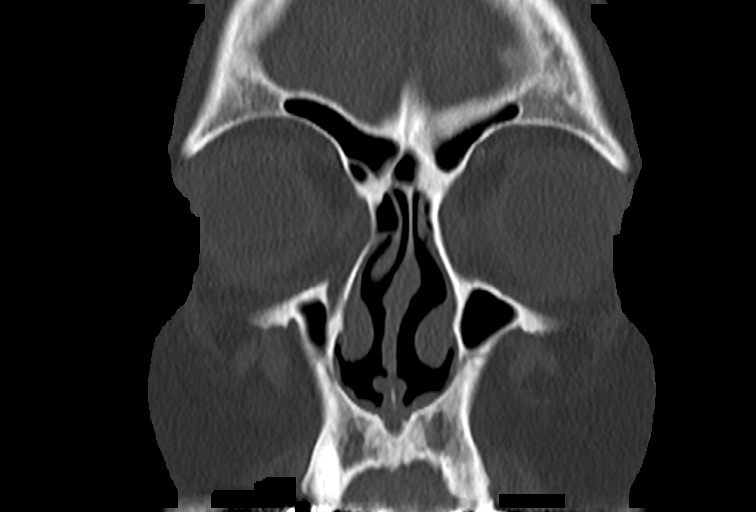
[im 30/68  bone]
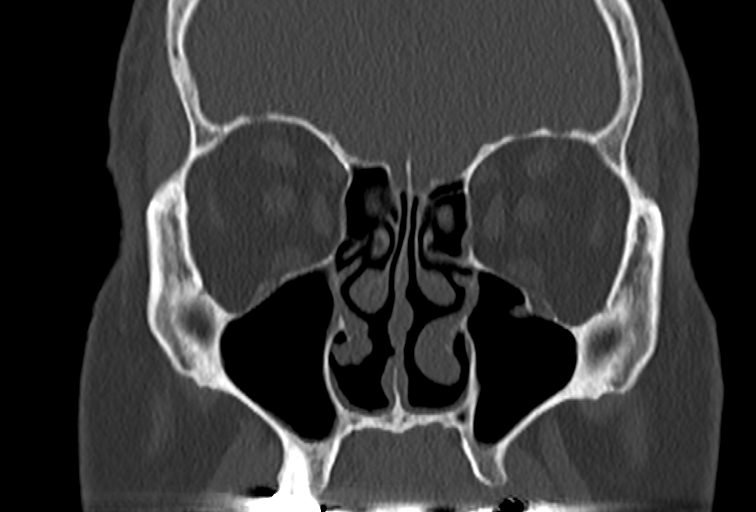
[im 38/68  bone]
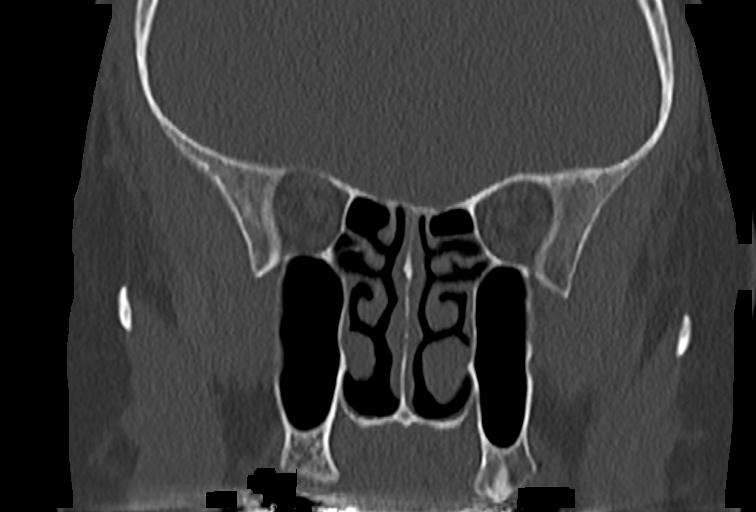

[Series 6: sinus 2.00 hr60 s3 sag · sagittal · 0.22mm/px · 3 of 81 slices shown]
[im 27/81  bone]
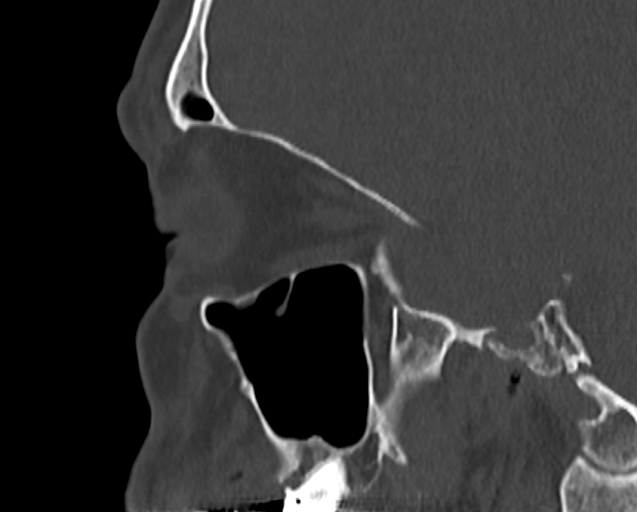
[im 41/81  bone]
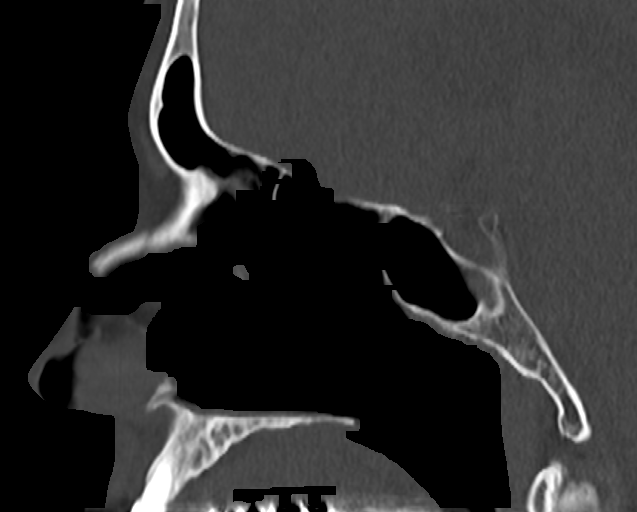
[im 54/81  bone]
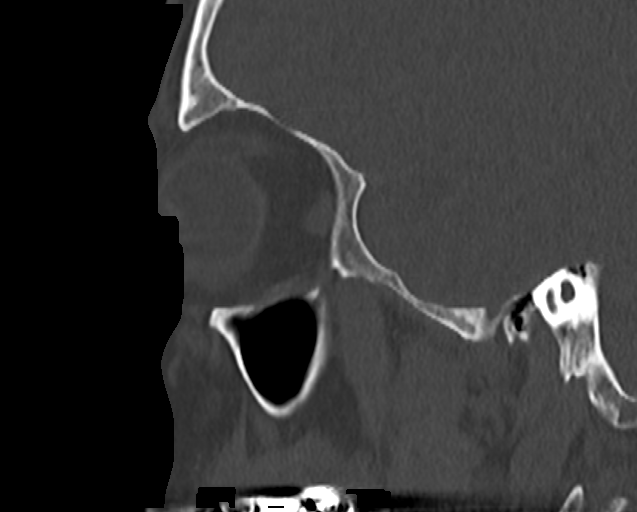

[14 of 47 positions shown; findings below may reference images not displayed]

FINDINGS: Osseous: No fracture or mandibular dislocation. No destructive
process.

Orbits: Negative. No traumatic or inflammatory finding.

Sinuses: Clear.

Soft tissues: Negative.

Limited intracranial: No significant or unexpected finding.
IMPRESSION: No evidence of sinusitis. No abnormality seen in maxillofacial
region.

## 2018-03-13 DIAGNOSIS — M960 Pseudarthrosis after fusion or arthrodesis: Secondary | ICD-10-CM | POA: Diagnosis not present

## 2018-03-13 DIAGNOSIS — Z9889 Other specified postprocedural states: Secondary | ICD-10-CM | POA: Diagnosis not present

## 2018-03-14 DIAGNOSIS — R2681 Unsteadiness on feet: Secondary | ICD-10-CM | POA: Diagnosis not present

## 2018-03-14 DIAGNOSIS — M6281 Muscle weakness (generalized): Secondary | ICD-10-CM | POA: Diagnosis not present

## 2018-03-14 DIAGNOSIS — I10 Essential (primary) hypertension: Secondary | ICD-10-CM | POA: Diagnosis not present

## 2018-03-14 DIAGNOSIS — Z4789 Encounter for other orthopedic aftercare: Secondary | ICD-10-CM | POA: Diagnosis not present

## 2018-03-14 DIAGNOSIS — Z981 Arthrodesis status: Secondary | ICD-10-CM | POA: Diagnosis not present

## 2018-03-15 ENCOUNTER — Other Ambulatory Visit: Payer: Self-pay | Admitting: Orthopedic Surgery

## 2018-03-16 DIAGNOSIS — Z981 Arthrodesis status: Secondary | ICD-10-CM | POA: Diagnosis not present

## 2018-03-16 DIAGNOSIS — M6281 Muscle weakness (generalized): Secondary | ICD-10-CM | POA: Diagnosis not present

## 2018-03-16 DIAGNOSIS — I10 Essential (primary) hypertension: Secondary | ICD-10-CM | POA: Diagnosis not present

## 2018-03-16 DIAGNOSIS — R2681 Unsteadiness on feet: Secondary | ICD-10-CM | POA: Diagnosis not present

## 2018-03-16 DIAGNOSIS — Z4789 Encounter for other orthopedic aftercare: Secondary | ICD-10-CM | POA: Diagnosis not present

## 2018-03-22 DIAGNOSIS — Z9889 Other specified postprocedural states: Secondary | ICD-10-CM | POA: Diagnosis not present

## 2018-04-07 DIAGNOSIS — M5416 Radiculopathy, lumbar region: Secondary | ICD-10-CM | POA: Diagnosis not present

## 2018-04-07 DIAGNOSIS — M79672 Pain in left foot: Secondary | ICD-10-CM | POA: Diagnosis not present

## 2018-04-13 DIAGNOSIS — M79672 Pain in left foot: Secondary | ICD-10-CM | POA: Diagnosis not present

## 2018-04-17 DIAGNOSIS — M79672 Pain in left foot: Secondary | ICD-10-CM | POA: Diagnosis not present

## 2018-05-04 DIAGNOSIS — Z9889 Other specified postprocedural states: Secondary | ICD-10-CM | POA: Diagnosis not present

## 2018-06-05 DIAGNOSIS — Z9889 Other specified postprocedural states: Secondary | ICD-10-CM | POA: Diagnosis not present

## 2018-06-15 DIAGNOSIS — M25562 Pain in left knee: Secondary | ICD-10-CM | POA: Diagnosis not present

## 2018-06-15 DIAGNOSIS — M25561 Pain in right knee: Secondary | ICD-10-CM | POA: Diagnosis not present

## 2018-07-24 DIAGNOSIS — M545 Low back pain: Secondary | ICD-10-CM | POA: Diagnosis not present

## 2018-07-24 DIAGNOSIS — M5416 Radiculopathy, lumbar region: Secondary | ICD-10-CM | POA: Diagnosis not present

## 2018-07-28 DIAGNOSIS — H52223 Regular astigmatism, bilateral: Secondary | ICD-10-CM | POA: Diagnosis not present

## 2018-07-28 DIAGNOSIS — H5203 Hypermetropia, bilateral: Secondary | ICD-10-CM | POA: Diagnosis not present

## 2018-07-28 DIAGNOSIS — H35363 Drusen (degenerative) of macula, bilateral: Secondary | ICD-10-CM | POA: Diagnosis not present

## 2018-07-28 DIAGNOSIS — H2513 Age-related nuclear cataract, bilateral: Secondary | ICD-10-CM | POA: Diagnosis not present

## 2018-08-01 ENCOUNTER — Other Ambulatory Visit: Payer: Self-pay | Admitting: Orthopedic Surgery

## 2018-08-01 DIAGNOSIS — M545 Low back pain, unspecified: Secondary | ICD-10-CM

## 2018-08-08 DIAGNOSIS — I10 Essential (primary) hypertension: Secondary | ICD-10-CM | POA: Diagnosis not present

## 2018-08-08 DIAGNOSIS — Z125 Encounter for screening for malignant neoplasm of prostate: Secondary | ICD-10-CM | POA: Diagnosis not present

## 2018-08-16 DIAGNOSIS — I700 Atherosclerosis of aorta: Secondary | ICD-10-CM | POA: Diagnosis not present

## 2018-08-16 DIAGNOSIS — K219 Gastro-esophageal reflux disease without esophagitis: Secondary | ICD-10-CM | POA: Diagnosis not present

## 2018-08-16 DIAGNOSIS — M1990 Unspecified osteoarthritis, unspecified site: Secondary | ICD-10-CM | POA: Diagnosis not present

## 2018-08-16 DIAGNOSIS — I10 Essential (primary) hypertension: Secondary | ICD-10-CM | POA: Diagnosis not present

## 2018-08-16 DIAGNOSIS — E785 Hyperlipidemia, unspecified: Secondary | ICD-10-CM | POA: Diagnosis not present

## 2018-08-16 DIAGNOSIS — Z86010 Personal history of colonic polyps: Secondary | ICD-10-CM | POA: Diagnosis not present

## 2018-08-16 DIAGNOSIS — N400 Enlarged prostate without lower urinary tract symptoms: Secondary | ICD-10-CM | POA: Diagnosis not present

## 2018-08-16 DIAGNOSIS — Z125 Encounter for screening for malignant neoplasm of prostate: Secondary | ICD-10-CM | POA: Diagnosis not present

## 2018-08-24 ENCOUNTER — Other Ambulatory Visit: Payer: Self-pay | Admitting: Orthopedic Surgery

## 2018-08-29 ENCOUNTER — Other Ambulatory Visit: Payer: Self-pay

## 2018-08-29 ENCOUNTER — Ambulatory Visit
Admission: RE | Admit: 2018-08-29 | Discharge: 2018-08-29 | Disposition: A | Discharge status: Home / Self Care | Payer: Medicare HMO | Source: Ambulatory Visit | Attending: Orthopedic Surgery | Admitting: Orthopedic Surgery

## 2018-08-29 DIAGNOSIS — M545 Low back pain, unspecified: Secondary | ICD-10-CM

## 2018-08-29 DIAGNOSIS — M47816 Spondylosis without myelopathy or radiculopathy, lumbar region: Secondary | ICD-10-CM | POA: Diagnosis not present

## 2018-08-29 DIAGNOSIS — Z981 Arthrodesis status: Secondary | ICD-10-CM | POA: Diagnosis not present

## 2018-09-01 DIAGNOSIS — M5416 Radiculopathy, lumbar region: Secondary | ICD-10-CM | POA: Diagnosis not present

## 2018-09-19 DIAGNOSIS — I10 Essential (primary) hypertension: Secondary | ICD-10-CM | POA: Diagnosis not present

## 2018-09-19 DIAGNOSIS — Z6833 Body mass index (BMI) 33.0-33.9, adult: Secondary | ICD-10-CM | POA: Diagnosis not present

## 2018-09-19 DIAGNOSIS — M961 Postlaminectomy syndrome, not elsewhere classified: Secondary | ICD-10-CM | POA: Diagnosis not present

## 2018-10-16 DIAGNOSIS — M5416 Radiculopathy, lumbar region: Secondary | ICD-10-CM | POA: Diagnosis not present

## 2018-10-31 DIAGNOSIS — M5416 Radiculopathy, lumbar region: Secondary | ICD-10-CM | POA: Diagnosis not present

## 2018-11-10 DIAGNOSIS — I10 Essential (primary) hypertension: Secondary | ICD-10-CM | POA: Diagnosis not present

## 2018-11-10 DIAGNOSIS — N400 Enlarged prostate without lower urinary tract symptoms: Secondary | ICD-10-CM | POA: Diagnosis not present

## 2018-11-10 DIAGNOSIS — M1990 Unspecified osteoarthritis, unspecified site: Secondary | ICD-10-CM | POA: Diagnosis not present

## 2018-11-10 DIAGNOSIS — E785 Hyperlipidemia, unspecified: Secondary | ICD-10-CM | POA: Diagnosis not present

## 2018-11-21 DIAGNOSIS — Z79899 Other long term (current) drug therapy: Secondary | ICD-10-CM | POA: Diagnosis not present

## 2018-11-21 DIAGNOSIS — M5416 Radiculopathy, lumbar region: Secondary | ICD-10-CM | POA: Diagnosis not present

## 2018-11-21 DIAGNOSIS — I10 Essential (primary) hypertension: Secondary | ICD-10-CM | POA: Diagnosis not present

## 2018-11-21 DIAGNOSIS — M961 Postlaminectomy syndrome, not elsewhere classified: Secondary | ICD-10-CM | POA: Diagnosis not present

## 2018-11-21 DIAGNOSIS — Z6832 Body mass index (BMI) 32.0-32.9, adult: Secondary | ICD-10-CM | POA: Diagnosis not present

## 2018-11-30 DIAGNOSIS — Z6825 Body mass index (BMI) 25.0-25.9, adult: Secondary | ICD-10-CM | POA: Diagnosis not present

## 2018-11-30 DIAGNOSIS — M961 Postlaminectomy syndrome, not elsewhere classified: Secondary | ICD-10-CM | POA: Diagnosis not present

## 2019-01-28 IMAGING — RF DG LUMBAR SPINE 2-3V
1 series · 2 of 2 positions shown · non-contrast
Comparison: Lumbar spine MR dated 10/27/2017 and C-arm images
obtained earlier today.

CLINICAL DATA: L4-5 fusion.

EXAM:
DG C-ARM 61-120 MIN; LUMBAR SPINE - 2-3 VIEW

[Series 1: run · 2 of 2 slices shown]
[im 1/2]
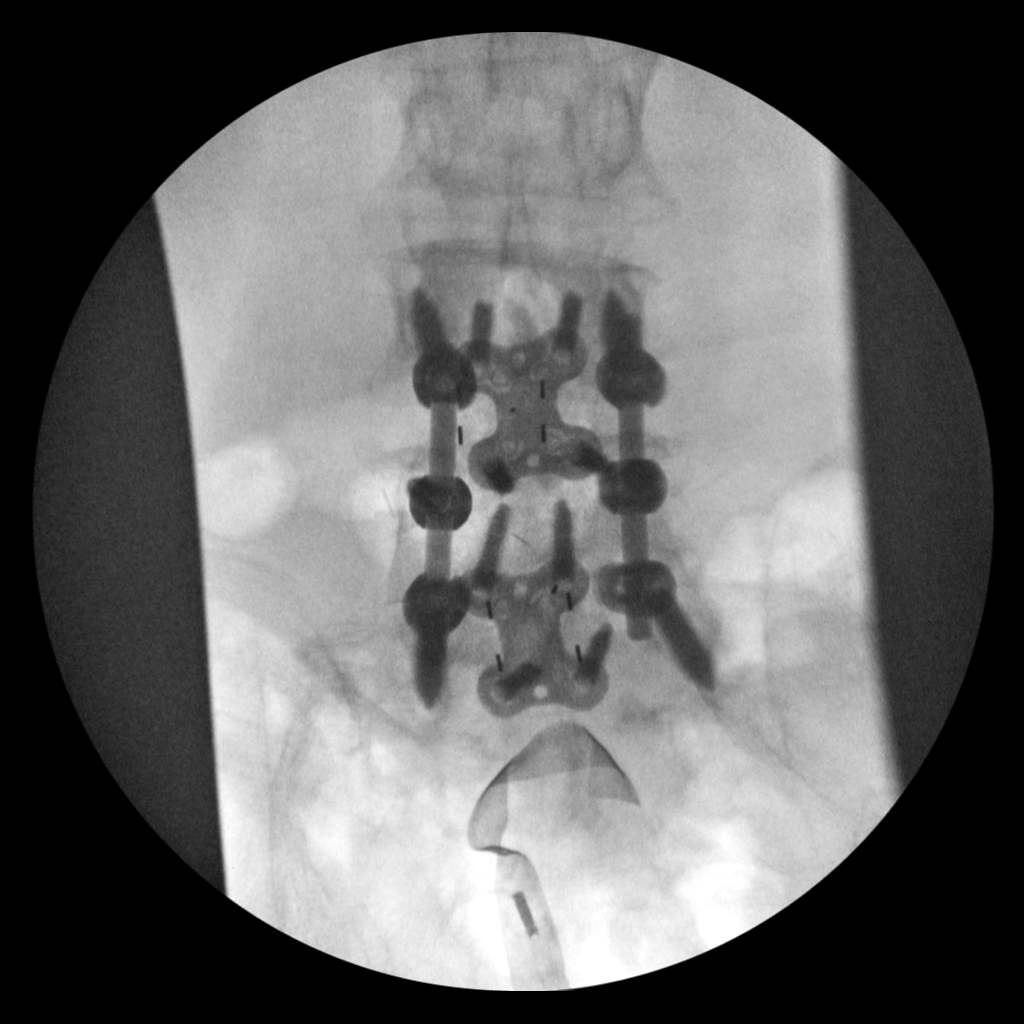
[im 2/2]
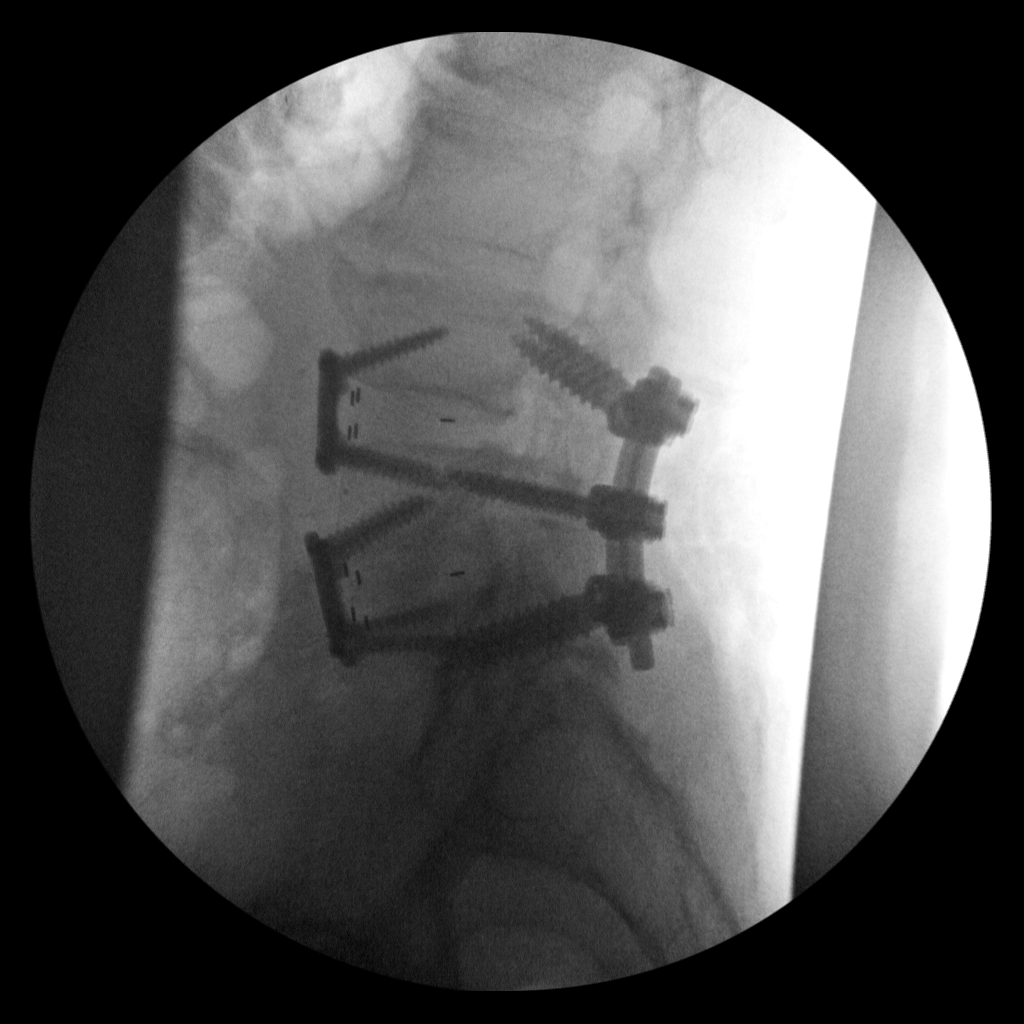

[2 of 2 positions shown; findings below may reference images not displayed]

FINDINGS: AP and lateral C-arm images of the lumbosacral spine demonstrate
interbody and anterior and posterior screw and plate fusion at the
L4-5 and L5-S1 levels. An inferior surgical drain is noted.
Degenerative changes are noted more superiorly. Grossly normal
alignment on these views.
IMPRESSION: Anterior and posterior fusion at the L4-5 and L5-S1 levels.

## 2019-01-29 DIAGNOSIS — Z7189 Other specified counseling: Secondary | ICD-10-CM | POA: Diagnosis not present

## 2019-01-29 DIAGNOSIS — G894 Chronic pain syndrome: Secondary | ICD-10-CM | POA: Diagnosis not present

## 2019-01-29 DIAGNOSIS — Z01818 Encounter for other preprocedural examination: Secondary | ICD-10-CM | POA: Diagnosis not present

## 2019-01-31 DIAGNOSIS — M47816 Spondylosis without myelopathy or radiculopathy, lumbar region: Secondary | ICD-10-CM | POA: Diagnosis not present

## 2019-02-09 DIAGNOSIS — E785 Hyperlipidemia, unspecified: Secondary | ICD-10-CM | POA: Diagnosis not present

## 2019-02-16 DIAGNOSIS — M47816 Spondylosis without myelopathy or radiculopathy, lumbar region: Secondary | ICD-10-CM | POA: Diagnosis not present

## 2019-02-21 DIAGNOSIS — M5136 Other intervertebral disc degeneration, lumbar region: Secondary | ICD-10-CM | POA: Diagnosis not present

## 2019-02-21 DIAGNOSIS — M5441 Lumbago with sciatica, right side: Secondary | ICD-10-CM | POA: Diagnosis not present

## 2019-02-21 DIAGNOSIS — M5416 Radiculopathy, lumbar region: Secondary | ICD-10-CM | POA: Diagnosis not present

## 2019-02-21 DIAGNOSIS — E669 Obesity, unspecified: Secondary | ICD-10-CM | POA: Diagnosis not present

## 2019-02-21 DIAGNOSIS — G8929 Other chronic pain: Secondary | ICD-10-CM | POA: Diagnosis not present

## 2019-02-21 DIAGNOSIS — M961 Postlaminectomy syndrome, not elsewhere classified: Secondary | ICD-10-CM | POA: Diagnosis not present

## 2019-02-21 DIAGNOSIS — M5442 Lumbago with sciatica, left side: Secondary | ICD-10-CM | POA: Diagnosis not present

## 2019-02-21 DIAGNOSIS — M48062 Spinal stenosis, lumbar region with neurogenic claudication: Secondary | ICD-10-CM | POA: Diagnosis not present

## 2019-02-22 DIAGNOSIS — I10 Essential (primary) hypertension: Secondary | ICD-10-CM | POA: Diagnosis not present

## 2019-02-22 DIAGNOSIS — E785 Hyperlipidemia, unspecified: Secondary | ICD-10-CM | POA: Diagnosis not present

## 2019-02-22 DIAGNOSIS — M1990 Unspecified osteoarthritis, unspecified site: Secondary | ICD-10-CM | POA: Diagnosis not present

## 2019-02-22 DIAGNOSIS — M47816 Spondylosis without myelopathy or radiculopathy, lumbar region: Secondary | ICD-10-CM | POA: Diagnosis not present

## 2019-02-22 DIAGNOSIS — N400 Enlarged prostate without lower urinary tract symptoms: Secondary | ICD-10-CM | POA: Diagnosis not present

## 2019-03-19 DIAGNOSIS — M961 Postlaminectomy syndrome, not elsewhere classified: Secondary | ICD-10-CM | POA: Diagnosis not present

## 2019-03-19 DIAGNOSIS — M5416 Radiculopathy, lumbar region: Secondary | ICD-10-CM | POA: Diagnosis not present

## 2019-03-19 DIAGNOSIS — M5136 Other intervertebral disc degeneration, lumbar region: Secondary | ICD-10-CM | POA: Diagnosis not present

## 2019-03-19 DIAGNOSIS — M5442 Lumbago with sciatica, left side: Secondary | ICD-10-CM | POA: Diagnosis not present

## 2019-03-19 DIAGNOSIS — M48062 Spinal stenosis, lumbar region with neurogenic claudication: Secondary | ICD-10-CM | POA: Diagnosis not present

## 2019-03-19 DIAGNOSIS — G8929 Other chronic pain: Secondary | ICD-10-CM | POA: Diagnosis not present

## 2019-03-19 DIAGNOSIS — M5441 Lumbago with sciatica, right side: Secondary | ICD-10-CM | POA: Diagnosis not present

## 2019-03-19 DIAGNOSIS — M5106 Intervertebral disc disorders with myelopathy, lumbar region: Secondary | ICD-10-CM | POA: Diagnosis not present

## 2019-03-19 DIAGNOSIS — Z79891 Long term (current) use of opiate analgesic: Secondary | ICD-10-CM | POA: Diagnosis not present

## 2019-03-19 DIAGNOSIS — M5116 Intervertebral disc disorders with radiculopathy, lumbar region: Secondary | ICD-10-CM | POA: Diagnosis not present

## 2019-04-02 DIAGNOSIS — M47816 Spondylosis without myelopathy or radiculopathy, lumbar region: Secondary | ICD-10-CM | POA: Diagnosis not present

## 2019-04-02 DIAGNOSIS — I10 Essential (primary) hypertension: Secondary | ICD-10-CM | POA: Diagnosis not present

## 2019-04-02 DIAGNOSIS — E785 Hyperlipidemia, unspecified: Secondary | ICD-10-CM | POA: Diagnosis not present

## 2019-04-02 DIAGNOSIS — M1990 Unspecified osteoarthritis, unspecified site: Secondary | ICD-10-CM | POA: Diagnosis not present

## 2019-04-02 DIAGNOSIS — N400 Enlarged prostate without lower urinary tract symptoms: Secondary | ICD-10-CM | POA: Diagnosis not present

## 2019-04-04 DIAGNOSIS — M25512 Pain in left shoulder: Secondary | ICD-10-CM | POA: Diagnosis not present

## 2019-04-04 DIAGNOSIS — M5412 Radiculopathy, cervical region: Secondary | ICD-10-CM | POA: Diagnosis not present

## 2019-04-05 DIAGNOSIS — M25512 Pain in left shoulder: Secondary | ICD-10-CM | POA: Diagnosis not present

## 2019-04-05 DIAGNOSIS — M5412 Radiculopathy, cervical region: Secondary | ICD-10-CM | POA: Diagnosis not present

## 2019-04-06 DIAGNOSIS — M545 Low back pain: Secondary | ICD-10-CM | POA: Diagnosis not present

## 2019-04-06 DIAGNOSIS — Z6833 Body mass index (BMI) 33.0-33.9, adult: Secondary | ICD-10-CM | POA: Diagnosis not present

## 2019-04-06 DIAGNOSIS — M1990 Unspecified osteoarthritis, unspecified site: Secondary | ICD-10-CM | POA: Diagnosis not present

## 2019-04-06 DIAGNOSIS — Z01818 Encounter for other preprocedural examination: Secondary | ICD-10-CM | POA: Diagnosis not present

## 2019-04-06 DIAGNOSIS — I10 Essential (primary) hypertension: Secondary | ICD-10-CM | POA: Diagnosis not present

## 2019-04-06 DIAGNOSIS — Z01812 Encounter for preprocedural laboratory examination: Secondary | ICD-10-CM | POA: Diagnosis not present

## 2019-04-06 DIAGNOSIS — Z87891 Personal history of nicotine dependence: Secondary | ICD-10-CM | POA: Diagnosis not present

## 2019-04-06 DIAGNOSIS — Z01810 Encounter for preprocedural cardiovascular examination: Secondary | ICD-10-CM | POA: Diagnosis not present

## 2019-04-06 DIAGNOSIS — M48062 Spinal stenosis, lumbar region with neurogenic claudication: Secondary | ICD-10-CM | POA: Diagnosis not present

## 2019-04-06 DIAGNOSIS — E785 Hyperlipidemia, unspecified: Secondary | ICD-10-CM | POA: Diagnosis not present

## 2019-04-06 DIAGNOSIS — E669 Obesity, unspecified: Secondary | ICD-10-CM | POA: Diagnosis not present

## 2019-04-06 DIAGNOSIS — D72829 Elevated white blood cell count, unspecified: Secondary | ICD-10-CM | POA: Diagnosis not present

## 2019-04-12 DIAGNOSIS — M5412 Radiculopathy, cervical region: Secondary | ICD-10-CM | POA: Diagnosis not present

## 2019-04-12 DIAGNOSIS — I10 Essential (primary) hypertension: Secondary | ICD-10-CM | POA: Diagnosis not present

## 2019-04-12 DIAGNOSIS — M5030 Other cervical disc degeneration, unspecified cervical region: Secondary | ICD-10-CM | POA: Diagnosis not present

## 2019-04-12 DIAGNOSIS — M47812 Spondylosis without myelopathy or radiculopathy, cervical region: Secondary | ICD-10-CM | POA: Diagnosis not present

## 2019-04-12 DIAGNOSIS — Z4789 Encounter for other orthopedic aftercare: Secondary | ICD-10-CM | POA: Diagnosis not present

## 2019-04-12 DIAGNOSIS — M542 Cervicalgia: Secondary | ICD-10-CM | POA: Diagnosis not present

## 2019-04-16 DIAGNOSIS — Z01818 Encounter for other preprocedural examination: Secondary | ICD-10-CM | POA: Diagnosis not present

## 2019-04-19 DIAGNOSIS — Z6833 Body mass index (BMI) 33.0-33.9, adult: Secondary | ICD-10-CM | POA: Diagnosis not present

## 2019-04-19 DIAGNOSIS — E669 Obesity, unspecified: Secondary | ICD-10-CM | POA: Diagnosis not present

## 2019-04-19 DIAGNOSIS — Z8673 Personal history of transient ischemic attack (TIA), and cerebral infarction without residual deficits: Secondary | ICD-10-CM | POA: Diagnosis not present

## 2019-04-19 DIAGNOSIS — M5116 Intervertebral disc disorders with radiculopathy, lumbar region: Secondary | ICD-10-CM | POA: Diagnosis not present

## 2019-04-19 DIAGNOSIS — Z4542 Encounter for adjustment and management of neuropacemaker (brain) (peripheral nerve) (spinal cord): Secondary | ICD-10-CM | POA: Diagnosis not present

## 2019-04-19 DIAGNOSIS — M5416 Radiculopathy, lumbar region: Secondary | ICD-10-CM | POA: Diagnosis not present

## 2019-04-19 DIAGNOSIS — Z8249 Family history of ischemic heart disease and other diseases of the circulatory system: Secondary | ICD-10-CM | POA: Diagnosis not present

## 2019-04-19 DIAGNOSIS — Z8261 Family history of arthritis: Secondary | ICD-10-CM | POA: Diagnosis not present

## 2019-04-19 DIAGNOSIS — Z96653 Presence of artificial knee joint, bilateral: Secondary | ICD-10-CM | POA: Diagnosis not present

## 2019-04-19 DIAGNOSIS — E785 Hyperlipidemia, unspecified: Secondary | ICD-10-CM | POA: Diagnosis not present

## 2019-04-19 DIAGNOSIS — I10 Essential (primary) hypertension: Secondary | ICD-10-CM | POA: Diagnosis not present

## 2019-04-19 DIAGNOSIS — M5442 Lumbago with sciatica, left side: Secondary | ICD-10-CM | POA: Diagnosis not present

## 2019-04-19 DIAGNOSIS — M545 Low back pain: Secondary | ICD-10-CM | POA: Diagnosis not present

## 2019-04-19 DIAGNOSIS — G8929 Other chronic pain: Secondary | ICD-10-CM | POA: Diagnosis not present

## 2019-04-19 DIAGNOSIS — M961 Postlaminectomy syndrome, not elsewhere classified: Secondary | ICD-10-CM | POA: Diagnosis not present

## 2019-04-19 DIAGNOSIS — M5441 Lumbago with sciatica, right side: Secondary | ICD-10-CM | POA: Diagnosis not present

## 2019-05-03 DIAGNOSIS — M4722 Other spondylosis with radiculopathy, cervical region: Secondary | ICD-10-CM | POA: Diagnosis not present

## 2019-05-03 DIAGNOSIS — M47812 Spondylosis without myelopathy or radiculopathy, cervical region: Secondary | ICD-10-CM | POA: Diagnosis not present

## 2019-05-03 DIAGNOSIS — M5011 Cervical disc disorder with radiculopathy,  high cervical region: Secondary | ICD-10-CM | POA: Diagnosis not present

## 2019-05-03 DIAGNOSIS — M2578 Osteophyte, vertebrae: Secondary | ICD-10-CM | POA: Diagnosis not present

## 2019-05-03 DIAGNOSIS — Z981 Arthrodesis status: Secondary | ICD-10-CM | POA: Diagnosis not present

## 2019-05-03 DIAGNOSIS — Z1389 Encounter for screening for other disorder: Secondary | ICD-10-CM | POA: Diagnosis not present

## 2019-05-03 DIAGNOSIS — R200 Anesthesia of skin: Secondary | ICD-10-CM | POA: Diagnosis not present

## 2019-05-09 DIAGNOSIS — R69 Illness, unspecified: Secondary | ICD-10-CM | POA: Diagnosis not present

## 2019-05-25 DIAGNOSIS — F1120 Opioid dependence, uncomplicated: Secondary | ICD-10-CM | POA: Diagnosis not present

## 2019-05-25 DIAGNOSIS — M542 Cervicalgia: Secondary | ICD-10-CM | POA: Diagnosis not present

## 2019-05-25 DIAGNOSIS — M5030 Other cervical disc degeneration, unspecified cervical region: Secondary | ICD-10-CM | POA: Diagnosis not present

## 2019-05-25 DIAGNOSIS — M5412 Radiculopathy, cervical region: Secondary | ICD-10-CM | POA: Diagnosis not present

## 2019-06-06 DIAGNOSIS — E785 Hyperlipidemia, unspecified: Secondary | ICD-10-CM | POA: Diagnosis not present

## 2019-06-06 DIAGNOSIS — M1990 Unspecified osteoarthritis, unspecified site: Secondary | ICD-10-CM | POA: Diagnosis not present

## 2019-06-06 DIAGNOSIS — N400 Enlarged prostate without lower urinary tract symptoms: Secondary | ICD-10-CM | POA: Diagnosis not present

## 2019-06-06 DIAGNOSIS — I10 Essential (primary) hypertension: Secondary | ICD-10-CM | POA: Diagnosis not present

## 2019-06-06 DIAGNOSIS — M47816 Spondylosis without myelopathy or radiculopathy, lumbar region: Secondary | ICD-10-CM | POA: Diagnosis not present

## 2019-06-28 DIAGNOSIS — M5412 Radiculopathy, cervical region: Secondary | ICD-10-CM | POA: Diagnosis not present

## 2019-06-28 DIAGNOSIS — I10 Essential (primary) hypertension: Secondary | ICD-10-CM | POA: Diagnosis not present

## 2019-06-28 DIAGNOSIS — M5030 Other cervical disc degeneration, unspecified cervical region: Secondary | ICD-10-CM | POA: Diagnosis not present

## 2019-06-28 DIAGNOSIS — M542 Cervicalgia: Secondary | ICD-10-CM | POA: Diagnosis not present

## 2019-07-03 DIAGNOSIS — M1990 Unspecified osteoarthritis, unspecified site: Secondary | ICD-10-CM | POA: Diagnosis not present

## 2019-07-03 DIAGNOSIS — I10 Essential (primary) hypertension: Secondary | ICD-10-CM | POA: Diagnosis not present

## 2019-07-03 DIAGNOSIS — N400 Enlarged prostate without lower urinary tract symptoms: Secondary | ICD-10-CM | POA: Diagnosis not present

## 2019-07-03 DIAGNOSIS — E785 Hyperlipidemia, unspecified: Secondary | ICD-10-CM | POA: Diagnosis not present

## 2019-07-03 DIAGNOSIS — M47816 Spondylosis without myelopathy or radiculopathy, lumbar region: Secondary | ICD-10-CM | POA: Diagnosis not present

## 2019-07-20 DIAGNOSIS — R21 Rash and other nonspecific skin eruption: Secondary | ICD-10-CM | POA: Diagnosis not present

## 2019-08-09 DIAGNOSIS — M461 Sacroiliitis, not elsewhere classified: Secondary | ICD-10-CM | POA: Diagnosis not present

## 2019-09-25 DIAGNOSIS — M5030 Other cervical disc degeneration, unspecified cervical region: Secondary | ICD-10-CM | POA: Diagnosis not present

## 2019-09-25 DIAGNOSIS — I10 Essential (primary) hypertension: Secondary | ICD-10-CM | POA: Diagnosis not present

## 2019-09-25 DIAGNOSIS — M542 Cervicalgia: Secondary | ICD-10-CM | POA: Diagnosis not present

## 2019-09-25 DIAGNOSIS — F1120 Opioid dependence, uncomplicated: Secondary | ICD-10-CM | POA: Diagnosis not present

## 2019-09-25 DIAGNOSIS — M5412 Radiculopathy, cervical region: Secondary | ICD-10-CM | POA: Diagnosis not present

## 2019-10-03 DIAGNOSIS — N400 Enlarged prostate without lower urinary tract symptoms: Secondary | ICD-10-CM | POA: Diagnosis not present

## 2019-10-03 DIAGNOSIS — Z86010 Personal history of colonic polyps: Secondary | ICD-10-CM | POA: Diagnosis not present

## 2019-10-03 DIAGNOSIS — Z8719 Personal history of other diseases of the digestive system: Secondary | ICD-10-CM | POA: Diagnosis not present

## 2019-10-03 DIAGNOSIS — Z0000 Encounter for general adult medical examination without abnormal findings: Secondary | ICD-10-CM | POA: Diagnosis not present

## 2019-10-03 DIAGNOSIS — I10 Essential (primary) hypertension: Secondary | ICD-10-CM | POA: Diagnosis not present

## 2019-10-03 DIAGNOSIS — N529 Male erectile dysfunction, unspecified: Secondary | ICD-10-CM | POA: Diagnosis not present

## 2019-10-03 DIAGNOSIS — Z23 Encounter for immunization: Secondary | ICD-10-CM | POA: Diagnosis not present

## 2019-10-03 DIAGNOSIS — Z125 Encounter for screening for malignant neoplasm of prostate: Secondary | ICD-10-CM | POA: Diagnosis not present

## 2019-10-03 DIAGNOSIS — M47816 Spondylosis without myelopathy or radiculopathy, lumbar region: Secondary | ICD-10-CM | POA: Diagnosis not present

## 2019-10-03 DIAGNOSIS — R8290 Unspecified abnormal findings in urine: Secondary | ICD-10-CM | POA: Diagnosis not present

## 2019-10-03 DIAGNOSIS — E785 Hyperlipidemia, unspecified: Secondary | ICD-10-CM | POA: Diagnosis not present

## 2019-10-03 DIAGNOSIS — I700 Atherosclerosis of aorta: Secondary | ICD-10-CM | POA: Diagnosis not present

## 2019-10-18 DIAGNOSIS — M542 Cervicalgia: Secondary | ICD-10-CM | POA: Diagnosis not present

## 2019-10-18 DIAGNOSIS — M5412 Radiculopathy, cervical region: Secondary | ICD-10-CM | POA: Diagnosis not present

## 2019-10-18 DIAGNOSIS — M5030 Other cervical disc degeneration, unspecified cervical region: Secondary | ICD-10-CM | POA: Diagnosis not present

## 2019-10-22 DIAGNOSIS — M4722 Other spondylosis with radiculopathy, cervical region: Secondary | ICD-10-CM | POA: Diagnosis not present

## 2019-10-22 DIAGNOSIS — I10 Essential (primary) hypertension: Secondary | ICD-10-CM | POA: Diagnosis not present

## 2019-10-22 DIAGNOSIS — M542 Cervicalgia: Secondary | ICD-10-CM | POA: Diagnosis not present

## 2019-10-22 DIAGNOSIS — E669 Obesity, unspecified: Secondary | ICD-10-CM | POA: Diagnosis not present

## 2019-10-31 DIAGNOSIS — I10 Essential (primary) hypertension: Secondary | ICD-10-CM | POA: Diagnosis not present

## 2019-10-31 DIAGNOSIS — Z125 Encounter for screening for malignant neoplasm of prostate: Secondary | ICD-10-CM | POA: Diagnosis not present

## 2019-10-31 DIAGNOSIS — Z0000 Encounter for general adult medical examination without abnormal findings: Secondary | ICD-10-CM | POA: Diagnosis not present

## 2019-10-31 DIAGNOSIS — N400 Enlarged prostate without lower urinary tract symptoms: Secondary | ICD-10-CM | POA: Diagnosis not present

## 2019-10-31 DIAGNOSIS — Z86010 Personal history of colonic polyps: Secondary | ICD-10-CM | POA: Diagnosis not present

## 2019-10-31 DIAGNOSIS — I700 Atherosclerosis of aorta: Secondary | ICD-10-CM | POA: Diagnosis not present

## 2019-10-31 DIAGNOSIS — E785 Hyperlipidemia, unspecified: Secondary | ICD-10-CM | POA: Diagnosis not present

## 2019-10-31 DIAGNOSIS — Z8719 Personal history of other diseases of the digestive system: Secondary | ICD-10-CM | POA: Diagnosis not present

## 2019-10-31 DIAGNOSIS — M47816 Spondylosis without myelopathy or radiculopathy, lumbar region: Secondary | ICD-10-CM | POA: Diagnosis not present

## 2019-11-01 DIAGNOSIS — N400 Enlarged prostate without lower urinary tract symptoms: Secondary | ICD-10-CM | POA: Diagnosis not present

## 2019-11-01 DIAGNOSIS — I10 Essential (primary) hypertension: Secondary | ICD-10-CM | POA: Diagnosis not present

## 2019-11-01 DIAGNOSIS — M1990 Unspecified osteoarthritis, unspecified site: Secondary | ICD-10-CM | POA: Diagnosis not present

## 2019-11-01 DIAGNOSIS — E785 Hyperlipidemia, unspecified: Secondary | ICD-10-CM | POA: Diagnosis not present

## 2019-11-01 DIAGNOSIS — M47816 Spondylosis without myelopathy or radiculopathy, lumbar region: Secondary | ICD-10-CM | POA: Diagnosis not present

## 2019-11-22 DIAGNOSIS — G5602 Carpal tunnel syndrome, left upper limb: Secondary | ICD-10-CM | POA: Diagnosis not present

## 2019-11-22 DIAGNOSIS — M542 Cervicalgia: Secondary | ICD-10-CM | POA: Diagnosis not present

## 2019-12-05 DIAGNOSIS — I10 Essential (primary) hypertension: Secondary | ICD-10-CM | POA: Diagnosis not present

## 2019-12-05 DIAGNOSIS — M5416 Radiculopathy, lumbar region: Secondary | ICD-10-CM | POA: Diagnosis not present

## 2019-12-05 DIAGNOSIS — G8929 Other chronic pain: Secondary | ICD-10-CM | POA: Diagnosis not present

## 2019-12-05 DIAGNOSIS — Z9689 Presence of other specified functional implants: Secondary | ICD-10-CM | POA: Diagnosis not present

## 2019-12-05 DIAGNOSIS — M4722 Other spondylosis with radiculopathy, cervical region: Secondary | ICD-10-CM | POA: Diagnosis not present

## 2019-12-05 DIAGNOSIS — E669 Obesity, unspecified: Secondary | ICD-10-CM | POA: Diagnosis not present

## 2019-12-05 DIAGNOSIS — M5441 Lumbago with sciatica, right side: Secondary | ICD-10-CM | POA: Diagnosis not present

## 2019-12-05 DIAGNOSIS — M5442 Lumbago with sciatica, left side: Secondary | ICD-10-CM | POA: Diagnosis not present

## 2019-12-05 DIAGNOSIS — M542 Cervicalgia: Secondary | ICD-10-CM | POA: Diagnosis not present

## 2019-12-06 DIAGNOSIS — K219 Gastro-esophageal reflux disease without esophagitis: Secondary | ICD-10-CM | POA: Diagnosis not present

## 2019-12-06 DIAGNOSIS — E785 Hyperlipidemia, unspecified: Secondary | ICD-10-CM | POA: Diagnosis not present

## 2019-12-06 DIAGNOSIS — I10 Essential (primary) hypertension: Secondary | ICD-10-CM | POA: Diagnosis not present

## 2019-12-06 DIAGNOSIS — M47816 Spondylosis without myelopathy or radiculopathy, lumbar region: Secondary | ICD-10-CM | POA: Diagnosis not present

## 2019-12-06 DIAGNOSIS — N400 Enlarged prostate without lower urinary tract symptoms: Secondary | ICD-10-CM | POA: Diagnosis not present

## 2019-12-06 DIAGNOSIS — M1990 Unspecified osteoarthritis, unspecified site: Secondary | ICD-10-CM | POA: Diagnosis not present

## 2019-12-18 DIAGNOSIS — M5441 Lumbago with sciatica, right side: Secondary | ICD-10-CM | POA: Diagnosis not present

## 2019-12-18 DIAGNOSIS — M4726 Other spondylosis with radiculopathy, lumbar region: Secondary | ICD-10-CM | POA: Diagnosis not present

## 2019-12-18 DIAGNOSIS — G8929 Other chronic pain: Secondary | ICD-10-CM | POA: Diagnosis not present

## 2019-12-18 DIAGNOSIS — M5116 Intervertebral disc disorders with radiculopathy, lumbar region: Secondary | ICD-10-CM | POA: Diagnosis not present

## 2019-12-18 DIAGNOSIS — M5416 Radiculopathy, lumbar region: Secondary | ICD-10-CM | POA: Diagnosis not present

## 2019-12-18 DIAGNOSIS — Z981 Arthrodesis status: Secondary | ICD-10-CM | POA: Diagnosis not present

## 2019-12-18 DIAGNOSIS — M5442 Lumbago with sciatica, left side: Secondary | ICD-10-CM | POA: Diagnosis not present

## 2020-01-18 DIAGNOSIS — Z01810 Encounter for preprocedural cardiovascular examination: Secondary | ICD-10-CM | POA: Diagnosis not present

## 2020-01-18 DIAGNOSIS — M542 Cervicalgia: Secondary | ICD-10-CM | POA: Diagnosis not present

## 2020-01-18 DIAGNOSIS — E669 Obesity, unspecified: Secondary | ICD-10-CM | POA: Diagnosis not present

## 2020-01-18 DIAGNOSIS — Z01818 Encounter for other preprocedural examination: Secondary | ICD-10-CM | POA: Diagnosis not present

## 2020-01-18 DIAGNOSIS — Z01812 Encounter for preprocedural laboratory examination: Secondary | ICD-10-CM | POA: Diagnosis not present

## 2020-01-18 DIAGNOSIS — J302 Other seasonal allergic rhinitis: Secondary | ICD-10-CM | POA: Diagnosis not present

## 2020-01-18 DIAGNOSIS — M4722 Other spondylosis with radiculopathy, cervical region: Secondary | ICD-10-CM | POA: Diagnosis not present

## 2020-01-18 DIAGNOSIS — I10 Essential (primary) hypertension: Secondary | ICD-10-CM | POA: Diagnosis not present

## 2020-02-07 DIAGNOSIS — Z96653 Presence of artificial knee joint, bilateral: Secondary | ICD-10-CM | POA: Diagnosis not present

## 2020-02-07 DIAGNOSIS — M5412 Radiculopathy, cervical region: Secondary | ICD-10-CM | POA: Diagnosis not present

## 2020-02-07 DIAGNOSIS — Z87891 Personal history of nicotine dependence: Secondary | ICD-10-CM | POA: Diagnosis not present

## 2020-02-07 DIAGNOSIS — M542 Cervicalgia: Secondary | ICD-10-CM | POA: Diagnosis not present

## 2020-02-07 DIAGNOSIS — E785 Hyperlipidemia, unspecified: Secondary | ICD-10-CM | POA: Diagnosis not present

## 2020-02-07 DIAGNOSIS — Z79899 Other long term (current) drug therapy: Secondary | ICD-10-CM | POA: Diagnosis not present

## 2020-02-07 DIAGNOSIS — M4722 Other spondylosis with radiculopathy, cervical region: Secondary | ICD-10-CM | POA: Diagnosis not present

## 2020-02-07 DIAGNOSIS — Z6833 Body mass index (BMI) 33.0-33.9, adult: Secondary | ICD-10-CM | POA: Diagnosis not present

## 2020-02-07 DIAGNOSIS — E669 Obesity, unspecified: Secondary | ICD-10-CM | POA: Diagnosis not present

## 2020-02-07 DIAGNOSIS — I10 Essential (primary) hypertension: Secondary | ICD-10-CM | POA: Diagnosis not present

## 2020-02-28 DIAGNOSIS — K219 Gastro-esophageal reflux disease without esophagitis: Secondary | ICD-10-CM | POA: Diagnosis not present

## 2020-02-28 DIAGNOSIS — N400 Enlarged prostate without lower urinary tract symptoms: Secondary | ICD-10-CM | POA: Diagnosis not present

## 2020-02-28 DIAGNOSIS — M1990 Unspecified osteoarthritis, unspecified site: Secondary | ICD-10-CM | POA: Diagnosis not present

## 2020-02-28 DIAGNOSIS — M47816 Spondylosis without myelopathy or radiculopathy, lumbar region: Secondary | ICD-10-CM | POA: Diagnosis not present

## 2020-02-28 DIAGNOSIS — E785 Hyperlipidemia, unspecified: Secondary | ICD-10-CM | POA: Diagnosis not present

## 2020-02-28 DIAGNOSIS — I10 Essential (primary) hypertension: Secondary | ICD-10-CM | POA: Diagnosis not present

## 2020-03-04 DIAGNOSIS — M961 Postlaminectomy syndrome, not elsewhere classified: Secondary | ICD-10-CM | POA: Diagnosis not present

## 2020-03-04 DIAGNOSIS — M533 Sacrococcygeal disorders, not elsewhere classified: Secondary | ICD-10-CM | POA: Diagnosis not present

## 2020-03-04 DIAGNOSIS — M5136 Other intervertebral disc degeneration, lumbar region: Secondary | ICD-10-CM | POA: Diagnosis not present

## 2020-03-04 DIAGNOSIS — F1120 Opioid dependence, uncomplicated: Secondary | ICD-10-CM | POA: Diagnosis not present

## 2020-03-12 DIAGNOSIS — H35363 Drusen (degenerative) of macula, bilateral: Secondary | ICD-10-CM | POA: Diagnosis not present

## 2020-03-12 DIAGNOSIS — H2513 Age-related nuclear cataract, bilateral: Secondary | ICD-10-CM | POA: Diagnosis not present

## 2020-03-12 DIAGNOSIS — H43821 Vitreomacular adhesion, right eye: Secondary | ICD-10-CM | POA: Diagnosis not present

## 2020-03-19 DIAGNOSIS — E785 Hyperlipidemia, unspecified: Secondary | ICD-10-CM | POA: Diagnosis not present

## 2020-03-19 DIAGNOSIS — I10 Essential (primary) hypertension: Secondary | ICD-10-CM | POA: Diagnosis not present

## 2020-03-19 DIAGNOSIS — N400 Enlarged prostate without lower urinary tract symptoms: Secondary | ICD-10-CM | POA: Diagnosis not present

## 2020-03-19 DIAGNOSIS — M47816 Spondylosis without myelopathy or radiculopathy, lumbar region: Secondary | ICD-10-CM | POA: Diagnosis not present

## 2020-03-19 DIAGNOSIS — K219 Gastro-esophageal reflux disease without esophagitis: Secondary | ICD-10-CM | POA: Diagnosis not present

## 2020-03-19 DIAGNOSIS — M1990 Unspecified osteoarthritis, unspecified site: Secondary | ICD-10-CM | POA: Diagnosis not present

## 2020-03-25 DIAGNOSIS — L308 Other specified dermatitis: Secondary | ICD-10-CM | POA: Diagnosis not present

## 2020-04-09 DIAGNOSIS — H35363 Drusen (degenerative) of macula, bilateral: Secondary | ICD-10-CM | POA: Diagnosis not present

## 2020-04-09 DIAGNOSIS — H2513 Age-related nuclear cataract, bilateral: Secondary | ICD-10-CM | POA: Diagnosis not present

## 2020-04-09 DIAGNOSIS — H43821 Vitreomacular adhesion, right eye: Secondary | ICD-10-CM | POA: Diagnosis not present

## 2020-04-14 DIAGNOSIS — Z981 Arthrodesis status: Secondary | ICD-10-CM | POA: Diagnosis not present

## 2020-04-14 DIAGNOSIS — Z4789 Encounter for other orthopedic aftercare: Secondary | ICD-10-CM | POA: Diagnosis not present

## 2020-04-16 DIAGNOSIS — E785 Hyperlipidemia, unspecified: Secondary | ICD-10-CM | POA: Diagnosis not present

## 2020-04-16 DIAGNOSIS — K219 Gastro-esophageal reflux disease without esophagitis: Secondary | ICD-10-CM | POA: Diagnosis not present

## 2020-04-16 DIAGNOSIS — M47816 Spondylosis without myelopathy or radiculopathy, lumbar region: Secondary | ICD-10-CM | POA: Diagnosis not present

## 2020-04-16 DIAGNOSIS — M1990 Unspecified osteoarthritis, unspecified site: Secondary | ICD-10-CM | POA: Diagnosis not present

## 2020-04-16 DIAGNOSIS — I10 Essential (primary) hypertension: Secondary | ICD-10-CM | POA: Diagnosis not present

## 2020-04-16 DIAGNOSIS — N400 Enlarged prostate without lower urinary tract symptoms: Secondary | ICD-10-CM | POA: Diagnosis not present

## 2020-04-24 DIAGNOSIS — I10 Essential (primary) hypertension: Secondary | ICD-10-CM | POA: Diagnosis not present

## 2020-04-24 DIAGNOSIS — N529 Male erectile dysfunction, unspecified: Secondary | ICD-10-CM | POA: Diagnosis not present

## 2020-05-07 DIAGNOSIS — M79672 Pain in left foot: Secondary | ICD-10-CM | POA: Diagnosis not present

## 2020-05-08 DIAGNOSIS — B351 Tinea unguium: Secondary | ICD-10-CM | POA: Diagnosis not present

## 2020-05-21 DIAGNOSIS — F1120 Opioid dependence, uncomplicated: Secondary | ICD-10-CM | POA: Diagnosis not present

## 2020-05-21 DIAGNOSIS — M961 Postlaminectomy syndrome, not elsewhere classified: Secondary | ICD-10-CM | POA: Diagnosis not present

## 2020-05-21 DIAGNOSIS — M533 Sacrococcygeal disorders, not elsewhere classified: Secondary | ICD-10-CM | POA: Diagnosis not present

## 2020-05-21 DIAGNOSIS — M5136 Other intervertebral disc degeneration, lumbar region: Secondary | ICD-10-CM | POA: Diagnosis not present

## 2020-05-22 DIAGNOSIS — H903 Sensorineural hearing loss, bilateral: Secondary | ICD-10-CM | POA: Diagnosis not present

## 2020-06-04 DIAGNOSIS — M722 Plantar fascial fibromatosis: Secondary | ICD-10-CM | POA: Diagnosis not present

## 2020-06-04 DIAGNOSIS — M79672 Pain in left foot: Secondary | ICD-10-CM | POA: Diagnosis not present

## 2020-06-05 DIAGNOSIS — H43813 Vitreous degeneration, bilateral: Secondary | ICD-10-CM | POA: Diagnosis not present

## 2020-06-05 DIAGNOSIS — H353131 Nonexudative age-related macular degeneration, bilateral, early dry stage: Secondary | ICD-10-CM | POA: Diagnosis not present

## 2020-06-05 DIAGNOSIS — H2513 Age-related nuclear cataract, bilateral: Secondary | ICD-10-CM | POA: Diagnosis not present

## 2020-06-05 DIAGNOSIS — H43821 Vitreomacular adhesion, right eye: Secondary | ICD-10-CM | POA: Diagnosis not present

## 2020-06-05 DIAGNOSIS — H04123 Dry eye syndrome of bilateral lacrimal glands: Secondary | ICD-10-CM | POA: Diagnosis not present

## 2020-06-10 DIAGNOSIS — H903 Sensorineural hearing loss, bilateral: Secondary | ICD-10-CM | POA: Diagnosis not present

## 2020-06-23 DIAGNOSIS — K219 Gastro-esophageal reflux disease without esophagitis: Secondary | ICD-10-CM | POA: Diagnosis not present

## 2020-06-23 DIAGNOSIS — N400 Enlarged prostate without lower urinary tract symptoms: Secondary | ICD-10-CM | POA: Diagnosis not present

## 2020-06-23 DIAGNOSIS — I10 Essential (primary) hypertension: Secondary | ICD-10-CM | POA: Diagnosis not present

## 2020-06-23 DIAGNOSIS — M1990 Unspecified osteoarthritis, unspecified site: Secondary | ICD-10-CM | POA: Diagnosis not present

## 2020-06-23 DIAGNOSIS — M47816 Spondylosis without myelopathy or radiculopathy, lumbar region: Secondary | ICD-10-CM | POA: Diagnosis not present

## 2020-06-23 DIAGNOSIS — E785 Hyperlipidemia, unspecified: Secondary | ICD-10-CM | POA: Diagnosis not present

## 2020-07-14 DIAGNOSIS — I10 Essential (primary) hypertension: Secondary | ICD-10-CM | POA: Diagnosis not present

## 2020-07-14 DIAGNOSIS — M1990 Unspecified osteoarthritis, unspecified site: Secondary | ICD-10-CM | POA: Diagnosis not present

## 2020-07-14 DIAGNOSIS — K219 Gastro-esophageal reflux disease without esophagitis: Secondary | ICD-10-CM | POA: Diagnosis not present

## 2020-07-14 DIAGNOSIS — M47816 Spondylosis without myelopathy or radiculopathy, lumbar region: Secondary | ICD-10-CM | POA: Diagnosis not present

## 2020-07-14 DIAGNOSIS — E785 Hyperlipidemia, unspecified: Secondary | ICD-10-CM | POA: Diagnosis not present

## 2020-07-14 DIAGNOSIS — N400 Enlarged prostate without lower urinary tract symptoms: Secondary | ICD-10-CM | POA: Diagnosis not present

## 2020-08-08 DIAGNOSIS — M961 Postlaminectomy syndrome, not elsewhere classified: Secondary | ICD-10-CM | POA: Diagnosis not present

## 2020-08-08 DIAGNOSIS — M533 Sacrococcygeal disorders, not elsewhere classified: Secondary | ICD-10-CM | POA: Diagnosis not present

## 2020-08-08 DIAGNOSIS — F1120 Opioid dependence, uncomplicated: Secondary | ICD-10-CM | POA: Diagnosis not present

## 2020-08-08 DIAGNOSIS — M5030 Other cervical disc degeneration, unspecified cervical region: Secondary | ICD-10-CM | POA: Diagnosis not present

## 2020-08-08 DIAGNOSIS — G894 Chronic pain syndrome: Secondary | ICD-10-CM | POA: Diagnosis not present

## 2020-09-08 DIAGNOSIS — E669 Obesity, unspecified: Secondary | ICD-10-CM | POA: Diagnosis not present

## 2020-09-08 DIAGNOSIS — M961 Postlaminectomy syndrome, not elsewhere classified: Secondary | ICD-10-CM | POA: Diagnosis not present

## 2020-09-08 DIAGNOSIS — G894 Chronic pain syndrome: Secondary | ICD-10-CM | POA: Diagnosis not present

## 2020-09-08 DIAGNOSIS — Z981 Arthrodesis status: Secondary | ICD-10-CM | POA: Diagnosis not present

## 2020-09-08 DIAGNOSIS — M533 Sacrococcygeal disorders, not elsewhere classified: Secondary | ICD-10-CM | POA: Diagnosis not present

## 2020-09-11 DIAGNOSIS — H9202 Otalgia, left ear: Secondary | ICD-10-CM | POA: Diagnosis not present

## 2020-09-26 DIAGNOSIS — M47816 Spondylosis without myelopathy or radiculopathy, lumbar region: Secondary | ICD-10-CM | POA: Diagnosis not present

## 2020-09-26 DIAGNOSIS — M1990 Unspecified osteoarthritis, unspecified site: Secondary | ICD-10-CM | POA: Diagnosis not present

## 2020-09-26 DIAGNOSIS — N400 Enlarged prostate without lower urinary tract symptoms: Secondary | ICD-10-CM | POA: Diagnosis not present

## 2020-09-26 DIAGNOSIS — E785 Hyperlipidemia, unspecified: Secondary | ICD-10-CM | POA: Diagnosis not present

## 2020-09-26 DIAGNOSIS — I10 Essential (primary) hypertension: Secondary | ICD-10-CM | POA: Diagnosis not present

## 2020-09-26 DIAGNOSIS — K219 Gastro-esophageal reflux disease without esophagitis: Secondary | ICD-10-CM | POA: Diagnosis not present

## 2020-10-03 DIAGNOSIS — Z1389 Encounter for screening for other disorder: Secondary | ICD-10-CM | POA: Diagnosis not present

## 2020-10-03 DIAGNOSIS — I10 Essential (primary) hypertension: Secondary | ICD-10-CM | POA: Diagnosis not present

## 2020-10-03 DIAGNOSIS — Z0000 Encounter for general adult medical examination without abnormal findings: Secondary | ICD-10-CM | POA: Diagnosis not present

## 2020-10-03 DIAGNOSIS — N529 Male erectile dysfunction, unspecified: Secondary | ICD-10-CM | POA: Diagnosis not present

## 2020-10-03 DIAGNOSIS — N400 Enlarged prostate without lower urinary tract symptoms: Secondary | ICD-10-CM | POA: Diagnosis not present

## 2020-10-03 DIAGNOSIS — Z125 Encounter for screening for malignant neoplasm of prostate: Secondary | ICD-10-CM | POA: Diagnosis not present

## 2020-10-03 DIAGNOSIS — E785 Hyperlipidemia, unspecified: Secondary | ICD-10-CM | POA: Diagnosis not present

## 2020-10-10 DIAGNOSIS — E785 Hyperlipidemia, unspecified: Secondary | ICD-10-CM | POA: Diagnosis not present

## 2020-10-10 DIAGNOSIS — I10 Essential (primary) hypertension: Secondary | ICD-10-CM | POA: Diagnosis not present

## 2020-10-10 DIAGNOSIS — K219 Gastro-esophageal reflux disease without esophagitis: Secondary | ICD-10-CM | POA: Diagnosis not present

## 2020-10-10 DIAGNOSIS — H9202 Otalgia, left ear: Secondary | ICD-10-CM | POA: Diagnosis not present

## 2020-10-10 DIAGNOSIS — N529 Male erectile dysfunction, unspecified: Secondary | ICD-10-CM | POA: Diagnosis not present

## 2020-10-24 DIAGNOSIS — M47816 Spondylosis without myelopathy or radiculopathy, lumbar region: Secondary | ICD-10-CM | POA: Diagnosis not present

## 2020-10-24 DIAGNOSIS — M1990 Unspecified osteoarthritis, unspecified site: Secondary | ICD-10-CM | POA: Diagnosis not present

## 2020-10-24 DIAGNOSIS — E785 Hyperlipidemia, unspecified: Secondary | ICD-10-CM | POA: Diagnosis not present

## 2020-10-24 DIAGNOSIS — I10 Essential (primary) hypertension: Secondary | ICD-10-CM | POA: Diagnosis not present

## 2020-10-24 DIAGNOSIS — N400 Enlarged prostate without lower urinary tract symptoms: Secondary | ICD-10-CM | POA: Diagnosis not present

## 2020-10-24 DIAGNOSIS — K219 Gastro-esophageal reflux disease without esophagitis: Secondary | ICD-10-CM | POA: Diagnosis not present

## 2020-10-28 DIAGNOSIS — K5730 Diverticulosis of large intestine without perforation or abscess without bleeding: Secondary | ICD-10-CM | POA: Diagnosis not present

## 2020-10-28 DIAGNOSIS — D124 Benign neoplasm of descending colon: Secondary | ICD-10-CM | POA: Diagnosis not present

## 2020-10-28 DIAGNOSIS — Z86010 Personal history of colonic polyps: Secondary | ICD-10-CM | POA: Diagnosis not present

## 2020-10-28 DIAGNOSIS — K648 Other hemorrhoids: Secondary | ICD-10-CM | POA: Diagnosis not present

## 2020-10-31 DIAGNOSIS — D124 Benign neoplasm of descending colon: Secondary | ICD-10-CM | POA: Diagnosis not present

## 2020-11-24 DIAGNOSIS — H903 Sensorineural hearing loss, bilateral: Secondary | ICD-10-CM | POA: Diagnosis not present

## 2020-11-24 DIAGNOSIS — H6983 Other specified disorders of Eustachian tube, bilateral: Secondary | ICD-10-CM | POA: Diagnosis not present

## 2020-11-24 DIAGNOSIS — H6121 Impacted cerumen, right ear: Secondary | ICD-10-CM | POA: Diagnosis not present

## 2020-11-28 DIAGNOSIS — S43401A Unspecified sprain of right shoulder joint, initial encounter: Secondary | ICD-10-CM | POA: Diagnosis not present

## 2020-11-28 DIAGNOSIS — S43402A Unspecified sprain of left shoulder joint, initial encounter: Secondary | ICD-10-CM | POA: Diagnosis not present

## 2020-11-30 DIAGNOSIS — K219 Gastro-esophageal reflux disease without esophagitis: Secondary | ICD-10-CM | POA: Diagnosis not present

## 2020-11-30 DIAGNOSIS — E785 Hyperlipidemia, unspecified: Secondary | ICD-10-CM | POA: Diagnosis not present

## 2020-11-30 DIAGNOSIS — M1990 Unspecified osteoarthritis, unspecified site: Secondary | ICD-10-CM | POA: Diagnosis not present

## 2020-11-30 DIAGNOSIS — N400 Enlarged prostate without lower urinary tract symptoms: Secondary | ICD-10-CM | POA: Diagnosis not present

## 2020-11-30 DIAGNOSIS — I10 Essential (primary) hypertension: Secondary | ICD-10-CM | POA: Diagnosis not present

## 2020-12-01 DIAGNOSIS — M25512 Pain in left shoulder: Secondary | ICD-10-CM | POA: Diagnosis not present

## 2020-12-02 ENCOUNTER — Other Ambulatory Visit: Payer: Self-pay | Admitting: Orthopedic Surgery

## 2020-12-02 DIAGNOSIS — M25511 Pain in right shoulder: Secondary | ICD-10-CM

## 2020-12-19 DIAGNOSIS — M5412 Radiculopathy, cervical region: Secondary | ICD-10-CM | POA: Diagnosis not present

## 2020-12-19 DIAGNOSIS — M542 Cervicalgia: Secondary | ICD-10-CM | POA: Diagnosis not present

## 2020-12-19 DIAGNOSIS — F1120 Opioid dependence, uncomplicated: Secondary | ICD-10-CM | POA: Diagnosis not present

## 2020-12-19 DIAGNOSIS — M5030 Other cervical disc degeneration, unspecified cervical region: Secondary | ICD-10-CM | POA: Diagnosis not present

## 2020-12-21 ENCOUNTER — Ambulatory Visit
Admission: RE | Admit: 2020-12-21 | Discharge: 2020-12-21 | Disposition: A | Discharge status: Home / Self Care | Payer: Medicare HMO | Source: Ambulatory Visit | Attending: Orthopedic Surgery | Admitting: Orthopedic Surgery

## 2020-12-21 ENCOUNTER — Other Ambulatory Visit: Payer: Self-pay

## 2020-12-21 DIAGNOSIS — M25511 Pain in right shoulder: Secondary | ICD-10-CM

## 2020-12-23 ENCOUNTER — Other Ambulatory Visit: Payer: Medicare HMO

## 2020-12-24 DIAGNOSIS — M4722 Other spondylosis with radiculopathy, cervical region: Secondary | ICD-10-CM | POA: Diagnosis not present

## 2020-12-24 DIAGNOSIS — M542 Cervicalgia: Secondary | ICD-10-CM | POA: Diagnosis not present

## 2020-12-24 DIAGNOSIS — M75121 Complete rotator cuff tear or rupture of right shoulder, not specified as traumatic: Secondary | ICD-10-CM | POA: Diagnosis not present

## 2020-12-24 DIAGNOSIS — S43402A Unspecified sprain of left shoulder joint, initial encounter: Secondary | ICD-10-CM | POA: Diagnosis not present

## 2020-12-29 DIAGNOSIS — E669 Obesity, unspecified: Secondary | ICD-10-CM | POA: Diagnosis not present

## 2020-12-29 DIAGNOSIS — Z981 Arthrodesis status: Secondary | ICD-10-CM | POA: Diagnosis not present

## 2020-12-29 DIAGNOSIS — M25512 Pain in left shoulder: Secondary | ICD-10-CM | POA: Diagnosis not present

## 2020-12-29 DIAGNOSIS — M4722 Other spondylosis with radiculopathy, cervical region: Secondary | ICD-10-CM | POA: Diagnosis not present

## 2021-01-06 DIAGNOSIS — M25512 Pain in left shoulder: Secondary | ICD-10-CM | POA: Diagnosis not present

## 2021-01-27 DIAGNOSIS — M67912 Unspecified disorder of synovium and tendon, left shoulder: Secondary | ICD-10-CM | POA: Diagnosis not present

## 2021-01-27 DIAGNOSIS — M19012 Primary osteoarthritis, left shoulder: Secondary | ICD-10-CM | POA: Diagnosis not present

## 2021-01-27 DIAGNOSIS — Z981 Arthrodesis status: Secondary | ICD-10-CM | POA: Diagnosis not present

## 2021-01-27 DIAGNOSIS — M47813 Spondylosis without myelopathy or radiculopathy, cervicothoracic region: Secondary | ICD-10-CM | POA: Diagnosis not present

## 2021-01-27 DIAGNOSIS — M47812 Spondylosis without myelopathy or radiculopathy, cervical region: Secondary | ICD-10-CM | POA: Diagnosis not present

## 2021-01-27 DIAGNOSIS — M4802 Spinal stenosis, cervical region: Secondary | ICD-10-CM | POA: Diagnosis not present

## 2021-01-27 DIAGNOSIS — R936 Abnormal findings on diagnostic imaging of limbs: Secondary | ICD-10-CM | POA: Diagnosis not present

## 2021-01-27 DIAGNOSIS — M7542 Impingement syndrome of left shoulder: Secondary | ICD-10-CM | POA: Diagnosis not present

## 2021-01-27 DIAGNOSIS — M67922 Unspecified disorder of synovium and tendon, left upper arm: Secondary | ICD-10-CM | POA: Diagnosis not present

## 2021-01-27 DIAGNOSIS — M75112 Incomplete rotator cuff tear or rupture of left shoulder, not specified as traumatic: Secondary | ICD-10-CM | POA: Diagnosis not present

## 2021-02-04 DIAGNOSIS — M25512 Pain in left shoulder: Secondary | ICD-10-CM | POA: Diagnosis not present

## 2021-02-05 DIAGNOSIS — M66812 Spontaneous rupture of other tendons, left shoulder: Secondary | ICD-10-CM | POA: Diagnosis not present

## 2021-02-05 DIAGNOSIS — S46212A Strain of muscle, fascia and tendon of other parts of biceps, left arm, initial encounter: Secondary | ICD-10-CM | POA: Diagnosis not present

## 2021-02-05 DIAGNOSIS — M7522 Bicipital tendinitis, left shoulder: Secondary | ICD-10-CM | POA: Diagnosis not present

## 2021-02-05 DIAGNOSIS — M19012 Primary osteoarthritis, left shoulder: Secondary | ICD-10-CM | POA: Diagnosis not present

## 2021-02-05 DIAGNOSIS — G8918 Other acute postprocedural pain: Secondary | ICD-10-CM | POA: Diagnosis not present

## 2021-02-05 DIAGNOSIS — M24112 Other articular cartilage disorders, left shoulder: Secondary | ICD-10-CM | POA: Diagnosis not present

## 2021-02-05 DIAGNOSIS — M7542 Impingement syndrome of left shoulder: Secondary | ICD-10-CM | POA: Diagnosis not present

## 2021-02-12 DIAGNOSIS — H5203 Hypermetropia, bilateral: Secondary | ICD-10-CM | POA: Diagnosis not present

## 2021-02-12 DIAGNOSIS — Z0100 Encounter for examination of eyes and vision without abnormal findings: Secondary | ICD-10-CM | POA: Diagnosis not present

## 2021-02-20 DIAGNOSIS — M4722 Other spondylosis with radiculopathy, cervical region: Secondary | ICD-10-CM | POA: Diagnosis not present

## 2021-02-20 DIAGNOSIS — M25512 Pain in left shoulder: Secondary | ICD-10-CM | POA: Diagnosis not present

## 2021-02-27 DIAGNOSIS — N400 Enlarged prostate without lower urinary tract symptoms: Secondary | ICD-10-CM | POA: Diagnosis not present

## 2021-02-27 DIAGNOSIS — M47816 Spondylosis without myelopathy or radiculopathy, lumbar region: Secondary | ICD-10-CM | POA: Diagnosis not present

## 2021-02-27 DIAGNOSIS — M1990 Unspecified osteoarthritis, unspecified site: Secondary | ICD-10-CM | POA: Diagnosis not present

## 2021-02-27 DIAGNOSIS — I10 Essential (primary) hypertension: Secondary | ICD-10-CM | POA: Diagnosis not present

## 2021-02-27 DIAGNOSIS — K219 Gastro-esophageal reflux disease without esophagitis: Secondary | ICD-10-CM | POA: Diagnosis not present

## 2021-02-27 DIAGNOSIS — E785 Hyperlipidemia, unspecified: Secondary | ICD-10-CM | POA: Diagnosis not present

## 2021-03-09 DIAGNOSIS — R0981 Nasal congestion: Secondary | ICD-10-CM | POA: Diagnosis not present

## 2021-03-09 DIAGNOSIS — N529 Male erectile dysfunction, unspecified: Secondary | ICD-10-CM | POA: Diagnosis not present

## 2021-03-17 DIAGNOSIS — M5030 Other cervical disc degeneration, unspecified cervical region: Secondary | ICD-10-CM | POA: Diagnosis not present

## 2021-03-17 DIAGNOSIS — M5412 Radiculopathy, cervical region: Secondary | ICD-10-CM | POA: Diagnosis not present

## 2021-03-17 DIAGNOSIS — F1120 Opioid dependence, uncomplicated: Secondary | ICD-10-CM | POA: Diagnosis not present

## 2021-04-14 DIAGNOSIS — I10 Essential (primary) hypertension: Secondary | ICD-10-CM | POA: Diagnosis not present

## 2021-04-27 DIAGNOSIS — M7541 Impingement syndrome of right shoulder: Secondary | ICD-10-CM | POA: Diagnosis not present

## 2021-05-01 DIAGNOSIS — M7541 Impingement syndrome of right shoulder: Secondary | ICD-10-CM | POA: Diagnosis not present

## 2021-05-01 DIAGNOSIS — M65811 Other synovitis and tenosynovitis, right shoulder: Secondary | ICD-10-CM | POA: Diagnosis not present

## 2021-05-01 DIAGNOSIS — M24111 Other articular cartilage disorders, right shoulder: Secondary | ICD-10-CM | POA: Diagnosis not present

## 2021-05-01 DIAGNOSIS — M19011 Primary osteoarthritis, right shoulder: Secondary | ICD-10-CM | POA: Diagnosis not present

## 2021-05-01 DIAGNOSIS — M75121 Complete rotator cuff tear or rupture of right shoulder, not specified as traumatic: Secondary | ICD-10-CM | POA: Diagnosis not present

## 2021-05-01 DIAGNOSIS — G8918 Other acute postprocedural pain: Secondary | ICD-10-CM | POA: Diagnosis not present

## 2021-06-09 DIAGNOSIS — M65331 Trigger finger, right middle finger: Secondary | ICD-10-CM | POA: Diagnosis not present

## 2021-06-09 DIAGNOSIS — M75121 Complete rotator cuff tear or rupture of right shoulder, not specified as traumatic: Secondary | ICD-10-CM | POA: Diagnosis not present

## 2021-06-15 DIAGNOSIS — M5136 Other intervertebral disc degeneration, lumbar region: Secondary | ICD-10-CM | POA: Diagnosis not present

## 2021-06-15 DIAGNOSIS — F1120 Opioid dependence, uncomplicated: Secondary | ICD-10-CM | POA: Diagnosis not present

## 2021-06-15 DIAGNOSIS — M961 Postlaminectomy syndrome, not elsewhere classified: Secondary | ICD-10-CM | POA: Diagnosis not present

## 2021-06-18 DIAGNOSIS — M65331 Trigger finger, right middle finger: Secondary | ICD-10-CM | POA: Diagnosis not present

## 2021-06-24 DIAGNOSIS — U071 COVID-19: Secondary | ICD-10-CM | POA: Diagnosis not present

## 2021-06-24 DIAGNOSIS — J029 Acute pharyngitis, unspecified: Secondary | ICD-10-CM | POA: Diagnosis not present

## 2021-06-24 DIAGNOSIS — R509 Fever, unspecified: Secondary | ICD-10-CM | POA: Diagnosis not present

## 2021-07-03 DIAGNOSIS — M961 Postlaminectomy syndrome, not elsewhere classified: Secondary | ICD-10-CM | POA: Diagnosis not present

## 2021-07-03 DIAGNOSIS — M5136 Other intervertebral disc degeneration, lumbar region: Secondary | ICD-10-CM | POA: Diagnosis not present

## 2021-07-03 DIAGNOSIS — F1120 Opioid dependence, uncomplicated: Secondary | ICD-10-CM | POA: Diagnosis not present

## 2021-07-17 DIAGNOSIS — H524 Presbyopia: Secondary | ICD-10-CM | POA: Diagnosis not present

## 2021-07-17 DIAGNOSIS — H5203 Hypermetropia, bilateral: Secondary | ICD-10-CM | POA: Diagnosis not present

## 2021-07-17 DIAGNOSIS — H52223 Regular astigmatism, bilateral: Secondary | ICD-10-CM | POA: Diagnosis not present

## 2021-07-17 DIAGNOSIS — H1132 Conjunctival hemorrhage, left eye: Secondary | ICD-10-CM | POA: Diagnosis not present

## 2021-07-28 DIAGNOSIS — J0110 Acute frontal sinusitis, unspecified: Secondary | ICD-10-CM | POA: Diagnosis not present

## 2021-08-03 DIAGNOSIS — K219 Gastro-esophageal reflux disease without esophagitis: Secondary | ICD-10-CM | POA: Diagnosis not present

## 2021-08-03 DIAGNOSIS — M1990 Unspecified osteoarthritis, unspecified site: Secondary | ICD-10-CM | POA: Diagnosis not present

## 2021-08-03 DIAGNOSIS — E785 Hyperlipidemia, unspecified: Secondary | ICD-10-CM | POA: Diagnosis not present

## 2021-08-03 DIAGNOSIS — I10 Essential (primary) hypertension: Secondary | ICD-10-CM | POA: Diagnosis not present

## 2021-08-07 DIAGNOSIS — M722 Plantar fascial fibromatosis: Secondary | ICD-10-CM | POA: Diagnosis not present

## 2021-08-10 DIAGNOSIS — I10 Essential (primary) hypertension: Secondary | ICD-10-CM | POA: Diagnosis not present

## 2021-08-10 DIAGNOSIS — J0110 Acute frontal sinusitis, unspecified: Secondary | ICD-10-CM | POA: Diagnosis not present

## 2021-08-10 DIAGNOSIS — R42 Dizziness and giddiness: Secondary | ICD-10-CM | POA: Diagnosis not present

## 2021-08-13 DIAGNOSIS — H1031 Unspecified acute conjunctivitis, right eye: Secondary | ICD-10-CM | POA: Diagnosis not present

## 2021-08-13 DIAGNOSIS — H1131 Conjunctival hemorrhage, right eye: Secondary | ICD-10-CM | POA: Diagnosis not present

## 2021-08-13 DIAGNOSIS — H04123 Dry eye syndrome of bilateral lacrimal glands: Secondary | ICD-10-CM | POA: Diagnosis not present

## 2021-08-19 ENCOUNTER — Other Ambulatory Visit: Payer: Self-pay | Admitting: Internal Medicine

## 2021-08-19 DIAGNOSIS — J0110 Acute frontal sinusitis, unspecified: Secondary | ICD-10-CM

## 2021-08-21 ENCOUNTER — Ambulatory Visit
Admission: RE | Admit: 2021-08-21 | Discharge: 2021-08-21 | Disposition: A | Discharge status: Home / Self Care | Payer: Medicare HMO | Source: Ambulatory Visit | Attending: Internal Medicine | Admitting: Internal Medicine

## 2021-08-21 DIAGNOSIS — J3489 Other specified disorders of nose and nasal sinuses: Secondary | ICD-10-CM | POA: Diagnosis not present

## 2021-08-21 DIAGNOSIS — R519 Headache, unspecified: Secondary | ICD-10-CM | POA: Diagnosis not present

## 2021-08-21 DIAGNOSIS — J0110 Acute frontal sinusitis, unspecified: Secondary | ICD-10-CM

## 2021-08-21 DIAGNOSIS — R42 Dizziness and giddiness: Secondary | ICD-10-CM | POA: Diagnosis not present

## 2021-08-24 DIAGNOSIS — R519 Headache, unspecified: Secondary | ICD-10-CM | POA: Diagnosis not present

## 2021-08-24 DIAGNOSIS — J321 Chronic frontal sinusitis: Secondary | ICD-10-CM | POA: Diagnosis not present

## 2021-08-27 DIAGNOSIS — J310 Chronic rhinitis: Secondary | ICD-10-CM | POA: Diagnosis not present

## 2021-08-27 DIAGNOSIS — H903 Sensorineural hearing loss, bilateral: Secondary | ICD-10-CM | POA: Diagnosis not present

## 2021-08-27 DIAGNOSIS — J342 Deviated nasal septum: Secondary | ICD-10-CM | POA: Diagnosis not present

## 2021-08-27 DIAGNOSIS — J343 Hypertrophy of nasal turbinates: Secondary | ICD-10-CM | POA: Diagnosis not present

## 2021-08-27 DIAGNOSIS — H6983 Other specified disorders of Eustachian tube, bilateral: Secondary | ICD-10-CM | POA: Diagnosis not present

## 2021-09-02 DIAGNOSIS — M722 Plantar fascial fibromatosis: Secondary | ICD-10-CM | POA: Diagnosis not present

## 2021-09-08 DIAGNOSIS — M5136 Other intervertebral disc degeneration, lumbar region: Secondary | ICD-10-CM | POA: Diagnosis not present

## 2021-09-08 DIAGNOSIS — M961 Postlaminectomy syndrome, not elsewhere classified: Secondary | ICD-10-CM | POA: Diagnosis not present

## 2021-09-08 DIAGNOSIS — F1120 Opioid dependence, uncomplicated: Secondary | ICD-10-CM | POA: Diagnosis not present

## 2021-09-10 DIAGNOSIS — M25561 Pain in right knee: Secondary | ICD-10-CM | POA: Diagnosis not present

## 2021-09-10 DIAGNOSIS — Z96651 Presence of right artificial knee joint: Secondary | ICD-10-CM | POA: Diagnosis not present

## 2021-09-10 DIAGNOSIS — Z96652 Presence of left artificial knee joint: Secondary | ICD-10-CM | POA: Diagnosis not present

## 2021-09-10 DIAGNOSIS — M25562 Pain in left knee: Secondary | ICD-10-CM | POA: Diagnosis not present

## 2021-09-16 DIAGNOSIS — Z049 Encounter for examination and observation for unspecified reason: Secondary | ICD-10-CM | POA: Diagnosis not present

## 2021-09-16 DIAGNOSIS — Z79899 Other long term (current) drug therapy: Secondary | ICD-10-CM | POA: Diagnosis not present

## 2021-09-16 DIAGNOSIS — G4452 New daily persistent headache (NDPH): Secondary | ICD-10-CM | POA: Diagnosis not present

## 2021-09-17 ENCOUNTER — Other Ambulatory Visit: Payer: Self-pay | Admitting: Specialist

## 2021-09-17 DIAGNOSIS — G4452 New daily persistent headache (NDPH): Secondary | ICD-10-CM

## 2021-09-18 DIAGNOSIS — G4452 New daily persistent headache (NDPH): Secondary | ICD-10-CM | POA: Diagnosis not present

## 2021-09-18 DIAGNOSIS — M791 Myalgia, unspecified site: Secondary | ICD-10-CM | POA: Diagnosis not present

## 2021-09-18 DIAGNOSIS — G518 Other disorders of facial nerve: Secondary | ICD-10-CM | POA: Diagnosis not present

## 2021-09-18 DIAGNOSIS — M542 Cervicalgia: Secondary | ICD-10-CM | POA: Diagnosis not present

## 2021-09-23 DIAGNOSIS — I10 Essential (primary) hypertension: Secondary | ICD-10-CM | POA: Diagnosis not present

## 2021-09-23 DIAGNOSIS — K219 Gastro-esophageal reflux disease without esophagitis: Secondary | ICD-10-CM | POA: Diagnosis not present

## 2021-09-23 DIAGNOSIS — N400 Enlarged prostate without lower urinary tract symptoms: Secondary | ICD-10-CM | POA: Diagnosis not present

## 2021-09-23 DIAGNOSIS — M1990 Unspecified osteoarthritis, unspecified site: Secondary | ICD-10-CM | POA: Diagnosis not present

## 2021-09-23 DIAGNOSIS — E785 Hyperlipidemia, unspecified: Secondary | ICD-10-CM | POA: Diagnosis not present

## 2021-09-23 DIAGNOSIS — G8929 Other chronic pain: Secondary | ICD-10-CM | POA: Diagnosis not present

## 2021-10-03 ENCOUNTER — Ambulatory Visit
Admission: RE | Admit: 2021-10-03 | Discharge: 2021-10-03 | Disposition: A | Discharge status: Home / Self Care | Payer: Medicare HMO | Source: Ambulatory Visit | Attending: Specialist | Admitting: Specialist

## 2021-10-03 DIAGNOSIS — R519 Headache, unspecified: Secondary | ICD-10-CM | POA: Diagnosis not present

## 2021-10-03 DIAGNOSIS — G4452 New daily persistent headache (NDPH): Secondary | ICD-10-CM

## 2021-10-03 MED ORDER — GADOPICLENOL 0.5 MMOL/ML IV SOLN
10.0000 mL | Freq: Once | INTRAVENOUS | Status: AC | PRN
  Administered 2021-10-03: 10 mL via INTRAVENOUS

## 2021-10-08 DIAGNOSIS — I10 Essential (primary) hypertension: Secondary | ICD-10-CM | POA: Diagnosis not present

## 2021-10-08 DIAGNOSIS — M542 Cervicalgia: Secondary | ICD-10-CM | POA: Diagnosis not present

## 2021-10-08 DIAGNOSIS — K219 Gastro-esophageal reflux disease without esophagitis: Secondary | ICD-10-CM | POA: Diagnosis not present

## 2021-10-08 DIAGNOSIS — E785 Hyperlipidemia, unspecified: Secondary | ICD-10-CM | POA: Diagnosis not present

## 2021-10-08 DIAGNOSIS — Z125 Encounter for screening for malignant neoplasm of prostate: Secondary | ICD-10-CM | POA: Diagnosis not present

## 2021-10-08 DIAGNOSIS — G518 Other disorders of facial nerve: Secondary | ICD-10-CM | POA: Diagnosis not present

## 2021-10-08 DIAGNOSIS — Z23 Encounter for immunization: Secondary | ICD-10-CM | POA: Diagnosis not present

## 2021-10-08 DIAGNOSIS — Z0000 Encounter for general adult medical examination without abnormal findings: Secondary | ICD-10-CM | POA: Diagnosis not present

## 2021-10-08 DIAGNOSIS — M791 Myalgia, unspecified site: Secondary | ICD-10-CM | POA: Diagnosis not present

## 2021-10-08 DIAGNOSIS — G4452 New daily persistent headache (NDPH): Secondary | ICD-10-CM | POA: Diagnosis not present

## 2021-10-08 DIAGNOSIS — D126 Benign neoplasm of colon, unspecified: Secondary | ICD-10-CM | POA: Diagnosis not present

## 2021-10-08 DIAGNOSIS — E669 Obesity, unspecified: Secondary | ICD-10-CM | POA: Diagnosis not present

## 2021-10-08 DIAGNOSIS — N529 Male erectile dysfunction, unspecified: Secondary | ICD-10-CM | POA: Diagnosis not present

## 2021-10-08 DIAGNOSIS — R519 Headache, unspecified: Secondary | ICD-10-CM | POA: Diagnosis not present

## 2021-10-08 DIAGNOSIS — G8929 Other chronic pain: Secondary | ICD-10-CM | POA: Diagnosis not present

## 2021-10-26 DIAGNOSIS — G4452 New daily persistent headache (NDPH): Secondary | ICD-10-CM | POA: Diagnosis not present

## 2021-10-26 DIAGNOSIS — M791 Myalgia, unspecified site: Secondary | ICD-10-CM | POA: Diagnosis not present

## 2021-10-26 DIAGNOSIS — M542 Cervicalgia: Secondary | ICD-10-CM | POA: Diagnosis not present

## 2021-10-26 DIAGNOSIS — G518 Other disorders of facial nerve: Secondary | ICD-10-CM | POA: Diagnosis not present

## 2021-11-10 DIAGNOSIS — M791 Myalgia, unspecified site: Secondary | ICD-10-CM | POA: Diagnosis not present

## 2021-11-10 DIAGNOSIS — G518 Other disorders of facial nerve: Secondary | ICD-10-CM | POA: Diagnosis not present

## 2021-11-10 DIAGNOSIS — M542 Cervicalgia: Secondary | ICD-10-CM | POA: Diagnosis not present

## 2021-11-10 DIAGNOSIS — G4452 New daily persistent headache (NDPH): Secondary | ICD-10-CM | POA: Diagnosis not present

## 2021-11-24 DIAGNOSIS — G4452 New daily persistent headache (NDPH): Secondary | ICD-10-CM | POA: Diagnosis not present

## 2021-11-24 DIAGNOSIS — M791 Myalgia, unspecified site: Secondary | ICD-10-CM | POA: Diagnosis not present

## 2021-11-24 DIAGNOSIS — M542 Cervicalgia: Secondary | ICD-10-CM | POA: Diagnosis not present

## 2021-11-24 DIAGNOSIS — G518 Other disorders of facial nerve: Secondary | ICD-10-CM | POA: Diagnosis not present

## 2021-12-08 DIAGNOSIS — M5136 Other intervertebral disc degeneration, lumbar region: Secondary | ICD-10-CM | POA: Diagnosis not present

## 2021-12-08 DIAGNOSIS — F1120 Opioid dependence, uncomplicated: Secondary | ICD-10-CM | POA: Diagnosis not present

## 2021-12-08 DIAGNOSIS — M7918 Myalgia, other site: Secondary | ICD-10-CM | POA: Diagnosis not present

## 2021-12-08 DIAGNOSIS — M961 Postlaminectomy syndrome, not elsewhere classified: Secondary | ICD-10-CM | POA: Diagnosis not present

## 2021-12-09 DIAGNOSIS — J343 Hypertrophy of nasal turbinates: Secondary | ICD-10-CM | POA: Diagnosis not present

## 2021-12-09 DIAGNOSIS — J310 Chronic rhinitis: Secondary | ICD-10-CM | POA: Diagnosis not present

## 2021-12-09 DIAGNOSIS — H9313 Tinnitus, bilateral: Secondary | ICD-10-CM | POA: Diagnosis not present

## 2021-12-09 DIAGNOSIS — H903 Sensorineural hearing loss, bilateral: Secondary | ICD-10-CM | POA: Diagnosis not present

## 2021-12-09 DIAGNOSIS — J342 Deviated nasal septum: Secondary | ICD-10-CM | POA: Diagnosis not present

## 2021-12-31 DIAGNOSIS — N400 Enlarged prostate without lower urinary tract symptoms: Secondary | ICD-10-CM | POA: Diagnosis not present

## 2021-12-31 DIAGNOSIS — R0981 Nasal congestion: Secondary | ICD-10-CM | POA: Diagnosis not present

## 2022-01-19 DIAGNOSIS — M533 Sacrococcygeal disorders, not elsewhere classified: Secondary | ICD-10-CM | POA: Diagnosis not present

## 2022-01-19 DIAGNOSIS — F1120 Opioid dependence, uncomplicated: Secondary | ICD-10-CM | POA: Diagnosis not present

## 2022-01-19 DIAGNOSIS — M961 Postlaminectomy syndrome, not elsewhere classified: Secondary | ICD-10-CM | POA: Diagnosis not present

## 2022-02-08 DIAGNOSIS — M5416 Radiculopathy, lumbar region: Secondary | ICD-10-CM | POA: Diagnosis not present

## 2022-02-15 DIAGNOSIS — M722 Plantar fascial fibromatosis: Secondary | ICD-10-CM | POA: Diagnosis not present

## 2022-02-17 DIAGNOSIS — L918 Other hypertrophic disorders of the skin: Secondary | ICD-10-CM | POA: Diagnosis not present

## 2022-02-23 DIAGNOSIS — M19042 Primary osteoarthritis, left hand: Secondary | ICD-10-CM | POA: Diagnosis not present

## 2022-03-08 DIAGNOSIS — M47819 Spondylosis without myelopathy or radiculopathy, site unspecified: Secondary | ICD-10-CM | POA: Diagnosis not present

## 2022-03-08 DIAGNOSIS — Z981 Arthrodesis status: Secondary | ICD-10-CM | POA: Diagnosis not present

## 2022-03-08 DIAGNOSIS — M47816 Spondylosis without myelopathy or radiculopathy, lumbar region: Secondary | ICD-10-CM | POA: Diagnosis not present

## 2022-03-08 DIAGNOSIS — M48061 Spinal stenosis, lumbar region without neurogenic claudication: Secondary | ICD-10-CM | POA: Diagnosis not present

## 2022-03-08 DIAGNOSIS — M2578 Osteophyte, vertebrae: Secondary | ICD-10-CM | POA: Diagnosis not present

## 2022-03-10 DIAGNOSIS — F1120 Opioid dependence, uncomplicated: Secondary | ICD-10-CM | POA: Diagnosis not present

## 2022-03-10 DIAGNOSIS — M961 Postlaminectomy syndrome, not elsewhere classified: Secondary | ICD-10-CM | POA: Diagnosis not present

## 2022-03-10 DIAGNOSIS — M5136 Other intervertebral disc degeneration, lumbar region: Secondary | ICD-10-CM | POA: Diagnosis not present

## 2022-03-10 DIAGNOSIS — M533 Sacrococcygeal disorders, not elsewhere classified: Secondary | ICD-10-CM | POA: Diagnosis not present

## 2022-03-18 DIAGNOSIS — H43813 Vitreous degeneration, bilateral: Secondary | ICD-10-CM | POA: Diagnosis not present

## 2022-03-18 DIAGNOSIS — H2513 Age-related nuclear cataract, bilateral: Secondary | ICD-10-CM | POA: Diagnosis not present

## 2022-03-18 DIAGNOSIS — H524 Presbyopia: Secondary | ICD-10-CM | POA: Diagnosis not present

## 2022-03-18 DIAGNOSIS — H353131 Nonexudative age-related macular degeneration, bilateral, early dry stage: Secondary | ICD-10-CM | POA: Diagnosis not present

## 2022-03-18 DIAGNOSIS — H52223 Regular astigmatism, bilateral: Secondary | ICD-10-CM | POA: Diagnosis not present

## 2022-03-18 DIAGNOSIS — H5203 Hypermetropia, bilateral: Secondary | ICD-10-CM | POA: Diagnosis not present

## 2022-03-18 DIAGNOSIS — H04123 Dry eye syndrome of bilateral lacrimal glands: Secondary | ICD-10-CM | POA: Diagnosis not present

## 2022-03-22 DIAGNOSIS — M461 Sacroiliitis, not elsewhere classified: Secondary | ICD-10-CM | POA: Diagnosis not present

## 2022-03-31 DIAGNOSIS — M47812 Spondylosis without myelopathy or radiculopathy, cervical region: Secondary | ICD-10-CM | POA: Diagnosis not present

## 2022-03-31 DIAGNOSIS — M542 Cervicalgia: Secondary | ICD-10-CM | POA: Diagnosis not present

## 2022-03-31 DIAGNOSIS — M4312 Spondylolisthesis, cervical region: Secondary | ICD-10-CM | POA: Diagnosis not present

## 2022-03-31 DIAGNOSIS — R55 Syncope and collapse: Secondary | ICD-10-CM | POA: Diagnosis not present

## 2022-03-31 DIAGNOSIS — E669 Obesity, unspecified: Secondary | ICD-10-CM | POA: Diagnosis not present

## 2022-03-31 DIAGNOSIS — Z981 Arthrodesis status: Secondary | ICD-10-CM | POA: Diagnosis not present

## 2022-04-12 DIAGNOSIS — M47812 Spondylosis without myelopathy or radiculopathy, cervical region: Secondary | ICD-10-CM | POA: Diagnosis not present

## 2022-04-23 DIAGNOSIS — I6523 Occlusion and stenosis of bilateral carotid arteries: Secondary | ICD-10-CM | POA: Diagnosis not present

## 2022-04-23 DIAGNOSIS — R42 Dizziness and giddiness: Secondary | ICD-10-CM | POA: Diagnosis not present

## 2022-05-11 DIAGNOSIS — M961 Postlaminectomy syndrome, not elsewhere classified: Secondary | ICD-10-CM | POA: Diagnosis not present

## 2022-05-17 DIAGNOSIS — Z981 Arthrodesis status: Secondary | ICD-10-CM | POA: Diagnosis not present

## 2022-05-17 DIAGNOSIS — G894 Chronic pain syndrome: Secondary | ICD-10-CM | POA: Diagnosis not present

## 2022-05-17 DIAGNOSIS — M542 Cervicalgia: Secondary | ICD-10-CM | POA: Diagnosis not present

## 2022-05-17 DIAGNOSIS — E669 Obesity, unspecified: Secondary | ICD-10-CM | POA: Diagnosis not present

## 2022-05-21 DIAGNOSIS — M5416 Radiculopathy, lumbar region: Secondary | ICD-10-CM | POA: Diagnosis not present

## 2022-06-02 DIAGNOSIS — F1120 Opioid dependence, uncomplicated: Secondary | ICD-10-CM | POA: Diagnosis not present

## 2022-06-02 DIAGNOSIS — M961 Postlaminectomy syndrome, not elsewhere classified: Secondary | ICD-10-CM | POA: Diagnosis not present

## 2022-06-02 DIAGNOSIS — M5416 Radiculopathy, lumbar region: Secondary | ICD-10-CM | POA: Diagnosis not present

## 2022-06-02 DIAGNOSIS — M5136 Other intervertebral disc degeneration, lumbar region: Secondary | ICD-10-CM | POA: Diagnosis not present

## 2022-07-02 DIAGNOSIS — M25572 Pain in left ankle and joints of left foot: Secondary | ICD-10-CM | POA: Diagnosis not present

## 2022-07-09 DIAGNOSIS — M25572 Pain in left ankle and joints of left foot: Secondary | ICD-10-CM | POA: Diagnosis not present

## 2022-07-14 DIAGNOSIS — M25572 Pain in left ankle and joints of left foot: Secondary | ICD-10-CM | POA: Diagnosis not present

## 2022-08-03 DIAGNOSIS — M5416 Radiculopathy, lumbar region: Secondary | ICD-10-CM | POA: Diagnosis not present

## 2022-08-03 DIAGNOSIS — M5136 Other intervertebral disc degeneration, lumbar region: Secondary | ICD-10-CM | POA: Diagnosis not present

## 2022-08-03 DIAGNOSIS — F1120 Opioid dependence, uncomplicated: Secondary | ICD-10-CM | POA: Diagnosis not present

## 2022-08-03 DIAGNOSIS — M961 Postlaminectomy syndrome, not elsewhere classified: Secondary | ICD-10-CM | POA: Diagnosis not present

## 2022-08-04 DIAGNOSIS — M4606 Spinal enthesopathy, lumbar region: Secondary | ICD-10-CM | POA: Diagnosis not present

## 2022-08-04 DIAGNOSIS — E669 Obesity, unspecified: Secondary | ICD-10-CM | POA: Diagnosis not present

## 2022-08-04 DIAGNOSIS — R600 Localized edema: Secondary | ICD-10-CM | POA: Diagnosis not present

## 2022-08-04 DIAGNOSIS — M4186 Other forms of scoliosis, lumbar region: Secondary | ICD-10-CM | POA: Diagnosis not present

## 2022-08-04 DIAGNOSIS — M461 Sacroiliitis, not elsewhere classified: Secondary | ICD-10-CM | POA: Diagnosis not present

## 2022-08-04 DIAGNOSIS — M5136 Other intervertebral disc degeneration, lumbar region: Secondary | ICD-10-CM | POA: Diagnosis not present

## 2022-08-04 DIAGNOSIS — M419 Scoliosis, unspecified: Secondary | ICD-10-CM | POA: Diagnosis not present

## 2022-08-04 DIAGNOSIS — G894 Chronic pain syndrome: Secondary | ICD-10-CM | POA: Diagnosis not present

## 2022-08-04 DIAGNOSIS — M545 Low back pain, unspecified: Secondary | ICD-10-CM | POA: Diagnosis not present

## 2022-08-04 DIAGNOSIS — M533 Sacrococcygeal disorders, not elsewhere classified: Secondary | ICD-10-CM | POA: Diagnosis not present

## 2022-08-04 DIAGNOSIS — M25852 Other specified joint disorders, left hip: Secondary | ICD-10-CM | POA: Diagnosis not present

## 2022-08-04 DIAGNOSIS — Z981 Arthrodesis status: Secondary | ICD-10-CM | POA: Diagnosis not present

## 2022-08-18 DIAGNOSIS — M7989 Other specified soft tissue disorders: Secondary | ICD-10-CM | POA: Diagnosis not present

## 2022-08-20 DIAGNOSIS — M5416 Radiculopathy, lumbar region: Secondary | ICD-10-CM | POA: Diagnosis not present

## 2022-09-08 DIAGNOSIS — M961 Postlaminectomy syndrome, not elsewhere classified: Secondary | ICD-10-CM | POA: Diagnosis not present

## 2022-09-08 DIAGNOSIS — F1120 Opioid dependence, uncomplicated: Secondary | ICD-10-CM | POA: Diagnosis not present

## 2022-09-08 DIAGNOSIS — M461 Sacroiliitis, not elsewhere classified: Secondary | ICD-10-CM | POA: Diagnosis not present

## 2022-09-14 DIAGNOSIS — M461 Sacroiliitis, not elsewhere classified: Secondary | ICD-10-CM | POA: Diagnosis not present

## 2022-10-06 DIAGNOSIS — U071 COVID-19: Secondary | ICD-10-CM | POA: Diagnosis not present

## 2022-10-06 DIAGNOSIS — J069 Acute upper respiratory infection, unspecified: Secondary | ICD-10-CM | POA: Diagnosis not present

## 2022-10-12 DIAGNOSIS — Z981 Arthrodesis status: Secondary | ICD-10-CM | POA: Diagnosis not present

## 2022-10-12 DIAGNOSIS — G894 Chronic pain syndrome: Secondary | ICD-10-CM | POA: Diagnosis not present

## 2022-10-25 DIAGNOSIS — M961 Postlaminectomy syndrome, not elsewhere classified: Secondary | ICD-10-CM | POA: Diagnosis not present

## 2022-10-25 DIAGNOSIS — M461 Sacroiliitis, not elsewhere classified: Secondary | ICD-10-CM | POA: Diagnosis not present

## 2022-10-28 DIAGNOSIS — I10 Essential (primary) hypertension: Secondary | ICD-10-CM | POA: Diagnosis not present

## 2022-10-28 DIAGNOSIS — Z125 Encounter for screening for malignant neoplasm of prostate: Secondary | ICD-10-CM | POA: Diagnosis not present

## 2022-10-28 DIAGNOSIS — Z23 Encounter for immunization: Secondary | ICD-10-CM | POA: Diagnosis not present

## 2022-10-28 DIAGNOSIS — I700 Atherosclerosis of aorta: Secondary | ICD-10-CM | POA: Diagnosis not present

## 2022-10-28 DIAGNOSIS — R7303 Prediabetes: Secondary | ICD-10-CM | POA: Diagnosis not present

## 2022-10-28 DIAGNOSIS — Z79899 Other long term (current) drug therapy: Secondary | ICD-10-CM | POA: Diagnosis not present

## 2022-10-28 DIAGNOSIS — Z0000 Encounter for general adult medical examination without abnormal findings: Secondary | ICD-10-CM | POA: Diagnosis not present

## 2022-10-28 DIAGNOSIS — Z1331 Encounter for screening for depression: Secondary | ICD-10-CM | POA: Diagnosis not present

## 2022-10-28 DIAGNOSIS — N529 Male erectile dysfunction, unspecified: Secondary | ICD-10-CM | POA: Diagnosis not present

## 2022-10-28 DIAGNOSIS — E785 Hyperlipidemia, unspecified: Secondary | ICD-10-CM | POA: Diagnosis not present

## 2022-10-28 DIAGNOSIS — D126 Benign neoplasm of colon, unspecified: Secondary | ICD-10-CM | POA: Diagnosis not present

## 2022-10-28 DIAGNOSIS — Z6835 Body mass index (BMI) 35.0-35.9, adult: Secondary | ICD-10-CM | POA: Diagnosis not present

## 2022-11-01 DIAGNOSIS — M533 Sacrococcygeal disorders, not elsewhere classified: Secondary | ICD-10-CM | POA: Diagnosis not present

## 2022-11-01 DIAGNOSIS — G894 Chronic pain syndrome: Secondary | ICD-10-CM | POA: Diagnosis not present

## 2022-11-01 DIAGNOSIS — E669 Obesity, unspecified: Secondary | ICD-10-CM | POA: Diagnosis not present

## 2022-11-01 DIAGNOSIS — Z981 Arthrodesis status: Secondary | ICD-10-CM | POA: Diagnosis not present

## 2022-11-03 DIAGNOSIS — H5203 Hypermetropia, bilateral: Secondary | ICD-10-CM | POA: Diagnosis not present

## 2022-11-03 DIAGNOSIS — H02136 Senile ectropion of left eye, unspecified eyelid: Secondary | ICD-10-CM | POA: Diagnosis not present

## 2022-11-03 DIAGNOSIS — H52223 Regular astigmatism, bilateral: Secondary | ICD-10-CM | POA: Diagnosis not present

## 2022-11-03 DIAGNOSIS — H43813 Vitreous degeneration, bilateral: Secondary | ICD-10-CM | POA: Diagnosis not present

## 2022-11-03 DIAGNOSIS — H524 Presbyopia: Secondary | ICD-10-CM | POA: Diagnosis not present

## 2022-11-03 DIAGNOSIS — H353131 Nonexudative age-related macular degeneration, bilateral, early dry stage: Secondary | ICD-10-CM | POA: Diagnosis not present

## 2022-11-03 DIAGNOSIS — H04123 Dry eye syndrome of bilateral lacrimal glands: Secondary | ICD-10-CM | POA: Diagnosis not present

## 2022-11-03 DIAGNOSIS — H2513 Age-related nuclear cataract, bilateral: Secondary | ICD-10-CM | POA: Diagnosis not present

## 2022-12-03 DIAGNOSIS — M4326 Fusion of spine, lumbar region: Secondary | ICD-10-CM | POA: Diagnosis not present

## 2022-12-03 DIAGNOSIS — G894 Chronic pain syndrome: Secondary | ICD-10-CM | POA: Diagnosis not present

## 2022-12-03 DIAGNOSIS — M48061 Spinal stenosis, lumbar region without neurogenic claudication: Secondary | ICD-10-CM | POA: Diagnosis not present

## 2022-12-03 DIAGNOSIS — Z981 Arthrodesis status: Secondary | ICD-10-CM | POA: Diagnosis not present

## 2022-12-13 DIAGNOSIS — Z981 Arthrodesis status: Secondary | ICD-10-CM | POA: Diagnosis not present

## 2022-12-13 DIAGNOSIS — G894 Chronic pain syndrome: Secondary | ICD-10-CM | POA: Diagnosis not present

## 2022-12-13 DIAGNOSIS — M533 Sacrococcygeal disorders, not elsewhere classified: Secondary | ICD-10-CM | POA: Diagnosis not present

## 2022-12-13 DIAGNOSIS — E66811 Obesity, class 1: Secondary | ICD-10-CM | POA: Diagnosis not present

## 2022-12-14 DIAGNOSIS — M961 Postlaminectomy syndrome, not elsewhere classified: Secondary | ICD-10-CM | POA: Diagnosis not present

## 2022-12-14 DIAGNOSIS — M47816 Spondylosis without myelopathy or radiculopathy, lumbar region: Secondary | ICD-10-CM | POA: Diagnosis not present

## 2022-12-14 DIAGNOSIS — F1120 Opioid dependence, uncomplicated: Secondary | ICD-10-CM | POA: Diagnosis not present

## 2022-12-14 DIAGNOSIS — M461 Sacroiliitis, not elsewhere classified: Secondary | ICD-10-CM | POA: Diagnosis not present

## 2023-03-16 DIAGNOSIS — G4700 Insomnia, unspecified: Secondary | ICD-10-CM | POA: Diagnosis not present

## 2023-03-16 DIAGNOSIS — Z1330 Encounter for screening examination for mental health and behavioral disorders, unspecified: Secondary | ICD-10-CM | POA: Diagnosis not present

## 2023-03-16 DIAGNOSIS — M461 Sacroiliitis, not elsewhere classified: Secondary | ICD-10-CM | POA: Diagnosis not present

## 2023-03-16 DIAGNOSIS — M961 Postlaminectomy syndrome, not elsewhere classified: Secondary | ICD-10-CM | POA: Diagnosis not present

## 2023-03-16 DIAGNOSIS — M47816 Spondylosis without myelopathy or radiculopathy, lumbar region: Secondary | ICD-10-CM | POA: Diagnosis not present

## 2023-04-04 DIAGNOSIS — N400 Enlarged prostate without lower urinary tract symptoms: Secondary | ICD-10-CM | POA: Diagnosis not present

## 2023-04-04 DIAGNOSIS — N410 Acute prostatitis: Secondary | ICD-10-CM | POA: Diagnosis not present

## 2023-04-15 ENCOUNTER — Other Ambulatory Visit: Payer: Self-pay | Admitting: Physician Assistant

## 2023-04-15 ENCOUNTER — Ambulatory Visit
Admission: RE | Admit: 2023-04-15 | Discharge: 2023-04-15 | Disposition: A | Discharge status: Home / Self Care | Source: Ambulatory Visit | Attending: Physician Assistant | Admitting: Physician Assistant

## 2023-04-15 DIAGNOSIS — I700 Atherosclerosis of aorta: Secondary | ICD-10-CM | POA: Diagnosis not present

## 2023-04-15 DIAGNOSIS — R102 Pelvic and perineal pain: Secondary | ICD-10-CM

## 2023-04-15 DIAGNOSIS — N400 Enlarged prostate without lower urinary tract symptoms: Secondary | ICD-10-CM | POA: Diagnosis not present

## 2023-04-15 DIAGNOSIS — K5730 Diverticulosis of large intestine without perforation or abscess without bleeding: Secondary | ICD-10-CM | POA: Diagnosis not present

## 2023-04-15 DIAGNOSIS — K6289 Other specified diseases of anus and rectum: Secondary | ICD-10-CM

## 2023-04-15 MED ORDER — IOPAMIDOL (ISOVUE-300) INJECTION 61%
500.0000 mL | Freq: Once | INTRAVENOUS | Status: AC | PRN
Start: 2023-04-15 — End: 2023-04-15
  Administered 2023-04-15: 100 mL via INTRAVENOUS

## 2023-04-25 DIAGNOSIS — R3915 Urgency of urination: Secondary | ICD-10-CM | POA: Diagnosis not present

## 2023-04-25 DIAGNOSIS — R102 Pelvic and perineal pain: Secondary | ICD-10-CM | POA: Diagnosis not present

## 2023-04-25 DIAGNOSIS — N401 Enlarged prostate with lower urinary tract symptoms: Secondary | ICD-10-CM | POA: Diagnosis not present

## 2023-04-27 DIAGNOSIS — G588 Other specified mononeuropathies: Secondary | ICD-10-CM | POA: Diagnosis not present

## 2023-04-29 ENCOUNTER — Encounter: Payer: Self-pay | Admitting: Physical Therapy

## 2023-04-29 ENCOUNTER — Ambulatory Visit: Attending: Physician Assistant | Admitting: Physical Therapy

## 2023-04-29 ENCOUNTER — Other Ambulatory Visit: Payer: Self-pay

## 2023-04-29 DIAGNOSIS — M6281 Muscle weakness (generalized): Secondary | ICD-10-CM | POA: Diagnosis not present

## 2023-04-29 DIAGNOSIS — R293 Abnormal posture: Secondary | ICD-10-CM | POA: Diagnosis not present

## 2023-04-29 DIAGNOSIS — R279 Unspecified lack of coordination: Secondary | ICD-10-CM | POA: Diagnosis not present

## 2023-04-29 NOTE — Therapy (Addendum)
 OUTPATIENT PHYSICAL THERAPY MALE PELVIC EVALUATION   Patient Name: Manuel Hood MRN: [id number] DOB:1950-03-07, 73 y.o., male Today's Date: 04/29/2023  END OF SESSION:  PT End of Session - 04/29/23 1213     Visit Number 1    Number of Visits 12    Authorization Type [employer]    PT Start Time 0930    PT Stop Time 1015    PT Time Calculation (min) 45 min    Activity Tolerance Patient tolerated treatment well    Behavior During Therapy WFL for tasks assessed/performed             Past Medical History:  Diagnosis Date   Arthritis    GERD (gastroesophageal reflux disease)    rare   Hypertension    Low back pain    Right leg pain    Seasonal allergies    Sinus pain    Past Surgical History:  Procedure Laterality Date   ABDOMINAL EXPOSURE N/A 02/22/2018   Procedure: ABDOMINAL EXPOSURE;  Surgeon: Larina Earthly, MD;  Location: [facility];  Service: Vascular;  Laterality: N/A;   ANTERIOR LUMBAR FUSION N/A 02/22/2018   Procedure: POSTERIOR REMOVAL OF INSTRUMENTATION . ANTERIOR LUMBAR INTERBODY FUSION LUMBAR 3-4, LUMBAR 4-5 WITH INSTRUMENTATION AND ALLOGRAFT;  Surgeon: Estill Bamberg, MD;  Location: [facility];  Service: Orthopedics;  Laterality: N/A;   APPENDECTOMY     BACK SURGERY     x2 (cervical and lumbar)   COLONOSCOPY W/ POLYPECTOMY     JOINT REPLACEMENT     Right-full Left- partial   KNEE ARTHROSCOPY  rt knee x3   KNEE ARTHROSCOPY WITH MEDIAL MENISECTOMY Left 06/29/2012   Procedure: LEFT KNEE ARTHROSCOPY WITH DEBRIDEMENT AND MENISECTOMY;  Surgeon: [name], MD;  Location: [facility];  Service: Orthopedics;  Laterality: Left;   LUMBAR LAMINECTOMY/DECOMPRESSION MICRODISCECTOMY Left 03/08/2018   Procedure: LEFT LUMBAR FOUR-FIVE, LUMBAR FIVE-SACRAL ONE DECOMPRESSION;  Surgeon: Estill Bamberg, MD;  Location: [facility];  Service: Orthopedics;  Laterality: Left;  LEFT LUMBAR FOUR-FIVE, LUMBAR FIVE-SACRAL ONE DECOMPRESSION   ROTATOR CUFF REPAIR Right rt shoulder   TONSILLECTOMY      TOTAL KNEE ARTHROPLASTY  02/04/2011   Procedure: TOTAL KNEE ARTHROPLASTY;  Surgeon: Javier Docker;  Location: [facility];  Service: Orthopedics;  Laterality: Right;   TOTAL KNEE REVISION Right 06/29/2012   Procedure: REVISION RIGHT TOTAL KNEE/REVISION FEMORAL COMPONENT POLY EXCHANGE/PATELLA LATERAL FACETECTOMY;  Surgeon: [name], MD;  Location: [facility];  Service: Orthopedics;  Laterality: Right;   Patient Active Problem List   Diagnosis Date Noted   Radiculopathy 09/29/2016   Expected blood loss anemia 06/30/2012   Obese 06/30/2012   S/P right TKA 06/29/2012   S/P left knee arthroscopy 06/29/2012    PCP: Turmel, Malva Cogan, PA   REFERRING PROVIDER: Ceasar Lund, PA   REFERRING DIAG: G58.8 (ICD-10-CM) - Other specified mononeuropathies  THERAPY DIAG:  Muscle weakness (generalized)  Unspecified lack of coordination  Abnormal posture  Rationale for Evaluation and Treatment: Rehabilitation  ONSET DATE: 6 months ago   SUBJECTIVE:  SUBJECTIVE STATEMENT: 6 months ago patient started noticing a sharp pain in the right sit bone area. It feels like he is sitting on a softball, and His is pain is constant. He sits on two seat cushions for work just to stay comfortable, walking makes it worse.  Fluid intake: drinks a lot of water, coffee, and tea   PAIN:  Are you having pain? Yes NPRS scale: 10/10 Pain location: Internal, Deep, Right, and Posterior  Pain type: sharp Pain description: constant   Aggravating factors: walking  Relieving factors: unknown   PRECAUTIONS: None  RED FLAGS: None   WEIGHT BEARING RESTRICTIONS: No  FALLS:  Has patient fallen in last 6 months? 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   PLOF: Independent  PATIENT GOALS: decrease pain  that he feels   PERTINENT HISTORY:  Arthritis, GERD (gastroesophageal reflux disease), Hypertension  BOWEL MOVEMENT: Pain with bowel movement: No Type of bowel movement:Type (Bristol Stool Scale) 3-4, Frequency 1-2x, Strain no, and Splinting no Fully empty rectum: Yes:   Leakage: No Pads: No Fiber supplement: Yes: stool softener  URINATION: Pain with urination: No Fully empty bladder: Yes:   Stream: Strong Urgency: Yes:   Frequency: within normal limits - gets up 3-4x/night to void  Leakage: none Pads: No  INTERCOURSE: Pain with intercourse: none Climax: yes  OBJECTIVE:  Note: Objective measures were completed at Evaluation unless otherwise noted.  PATIENT SURVEYS:  PFIQ-7 16  COGNITION: Overall cognitive status: Within functional limits for tasks assessed     SENSATION: Light touch: Appears intact Proprioception: Appears intact  LUMBAR SPECIAL TESTS:  Single leg stance test: Positive  FUNCTIONAL TESTS:  Squat: bilateral dynamic knee valgus with sit to stand, back pain present.  GAIT:   Moderate trendelenburg gait pattern with ambulation  POSTURE: rounded shoulders, forward head, increased thoracic kyphosis, and flexed trunk   PELVIC ALIGNMENT: within normal limits   LUMBARAROM/PROM:  A/PROM A/PROM  eval  Flexion 25% limited  Extension 25% limited  Right lateral flexion 25% limited  Left lateral flexion 25% limited  Right rotation 25% limited  Left rotation 25% limited   (Blank rows = not tested)  LOWER EXTREMITY AROM/PROM: within functional limits   A/PROM Right eval Left eval  Hip flexion    Hip extension    Hip abduction    Hip adduction    Hip internal rotation    Hip external rotation    Knee flexion    Knee extension    Ankle dorsiflexion    Ankle plantarflexion    Ankle inversion    Ankle eversion     (Blank rows = not tested)  LOWER EXTREMITY MMT: 4/5 bilateral knees and hips grossly   MMT Right eval Left eval  Hip  flexion    Hip extension    Hip abduction    Hip adduction    Hip internal rotation    Hip external rotation    Knee flexion    Knee extension    Ankle dorsiflexion    Ankle plantarflexion    Ankle inversion    Ankle eversion     PALPATION: GENERAL increased muscle tension in bilateral glutes and lumbar musculature               External Perineal Exam anal wink reflex positive, no skin irritation or inflammation present               Internal Pelvic Floor 4/5 pelvic floor contraction with  a significant lack of coordination in the musculature with contrac Patient confirms identification and approves PT to assess internal pelvic floor and treatment Yes No emotional/communication barriers or cognitive limitation. Patient is motivated to learn. Patient understands and agrees with treatment goals and plan. PT explains patient will be examined in standing, sitting, and lying down to see how their muscles and joints work. When they are ready, they will be asked to remove their underwear so PT can examine their perineum. The patient is also given the option of providing their own chaperone as one is not provided in our facility. The patient also has the right and is explained the right to defer or refuse any part of the evaluation or treatment including the internal exam. With the patient's consent, PT will use one gloved finger to gently assess the muscles of the pelvic floor, seeing how well it contracts and relaxes and if there is muscle symmetry. After, the patient will get dressed and PT and patient will discuss exam findings and plan of care. PT and patient discuss plan of care, schedule, attendance policy and HEP activities.  PELVIC MMT:   MMT eval  Internal Anal Sphincter 4/5  External Anal Sphincter 3/5  Puborectalis 5/5  Diastasis Recti N/A  (Blank rows = not tested)  TONE: Generally increased in rectal canal, especially after performing pelvic floor contractions during  examination.  TODAY'S TREATMENT:                                                                                                                              DATE:   EVAL 04/29/23: Examination completed, findings reviewed, pt educated on POC, HEP, and self care. Pt motivated to participate in PT and agreeable to attempt recommendations.   Neuro re-ed: Hooklying diaphragmatic breathing + pelvic floor lengthening and shortening 3x10 breaths  Self care  Relative pelvic floor anatomy and the connection between the diaphragm and pelvic floor  Water intake: stopping water consumption 1 hour before bedtime to decreased nighttime voiding frequency  PATIENT EDUCATION:  Education details: decreasing water intake after 9 pm before 10 pm bedtimeRelative pelvic floor anatomy and the connection between the diaphragm and pelvic floor, Water intake: stopping water consumption 1 hour before bedtime to decreased nighttime voiding frequency Person educated: Patient Education method: Explanation, Demonstration, Tactile cues, Verbal cues, and Handouts Education comprehension: verbalized understanding, returned demonstration, verbal cues required, tactile cues required, and needs further education  HOME EXERCISE PROGRAM: Access Code: [email or web address] URL: [email or web address] Date: 05/13/2023 Prepared by: Earna Coder  Exercises - Supine Pelvic Floor Contraction  - 1 x daily - 7 x weekly - 3 sets - 10 reps - Supine Lower Trunk Rotation  - 1 x daily - 7 x weekly - 3 sets - 10 reps - Supine Butterfly Groin Stretch  - 1 x daily - 7 x weekly - 3 sets - 10 reps - Hooklying Single Knee to Chest Stretch  - 1 x daily -  7 x weekly - 3 sets - 10 reps - Standing Hip Flexor Stretch  - 1 x daily - 7 x weekly - 3 sets - 10 reps - Seated Hip External Rotation Stretch  - 1 x daily - 7 x weekly - 3 sets - 10 reps - Seated Hip Adductor Stretch  - 1 x daily - 7 x weekly - 3 sets - 10 reps - Seated Hip Flexor Stretch  - 1  x daily - 7 x weekly - 3 sets - 10 reps - Seated Hamstring Stretch  - 1 x daily - 7 x weekly - 3 sets - 10 reps - Prone Press Up  - 1 x daily - 7 x weekly - 3 sets - 10 reps  ASSESSMENT:  CLINICAL IMPRESSION: Patient is a 73 y.o. male  who was seen today for physical therapy evaluation and treatment for right sided deep pelvic floor pain that began 6 months ago. This pain is located in the deep right side of his pelvic floor, near his ischial tuberosity. Patient has no urinary or constipation related concerns, other than increased nighttime frequency. Internal rectal examination revealed a significant lack of coordination between the patient's diaphragm and pelvic floor. Patient demonstrates a strong pelvic floor muscle contraction, but increased stiffness in the rectum following contraction. With internal cueing, patient was able to actively lengthen and shorten his pelvic floor with breathing. Puborectalis release on right side provided some pain relief at the end of today's session. Overall, patient tolerated session well and would benefit from skilled interventions to address the above deficits.   OBJECTIVE IMPAIRMENTS: decreased coordination, decreased endurance, decreased mobility, decreased ROM, decreased strength, and pain.   ACTIVITY LIMITATIONS: sitting, standing, and walking  PARTICIPATION LIMITATIONS:  exercise/work  PERSONAL FACTORS: Age and Past/current experiences are also affecting patient's functional outcome.   REHAB POTENTIAL: Good  CLINICAL DECISION MAKING: Stable/uncomplicated  EVALUATION COMPLEXITY: Low   GOALS: Goals reviewed with patient? Yes  SHORT TERM GOALS: Target date: 05/27/2023  Pt will be independent with HEP.  Baseline: Goal status: INITIAL  2.  Pt will be independent with diaphragmatic breathing and down training activities in order to improve pelvic floor relaxation. Baseline:  Goal status: INITIAL  3.  Pt will be able to correctly perform  diaphragmatic breathing and appropriate pressure management in order to prevent pelvic floor pain due to increased tissue mobility and blood flow. Baseline:  Goal status: INITIAL  4.  Pt will report 50% reduction of pain due to improvements in posture, strength, and muscle length in rectal canal to improve quality of life. Baseline:  Goal status: INITIAL  LONG TERM GOALS: Target date: 10/30/2023  Pt will be independent with advanced HEP.  Baseline:  Goal status: INITIAL  2.  Pt to demonstrate improved coordination of pelvic floor and breathing mechanics with 10# squat with appropriate synergistic patterns to decrease pain  at least 75% of the time.  Baseline:  Goal status: INITIAL  3.  Pt to demonstrate at least 4+/5 bil hip strength for improved pelvic stability and functional squats without pelvic pain being a limiting factor.  Baseline:  Goal status: INITIAL  4.  Patient will report increased sitting tolerance with little to no pelvic floor pain to allow him to work comfortably at least 75% of the time. Baseline:  Goal status: INITIAL  PLAN:  PT FREQUENCY: 1-2x/week  PT DURATION: 12 weeks  PLANNED INTERVENTIONS: 97110-Therapeutic exercises, 97530- Therapeutic activity, [id number]- Neuromuscular re-education, 97535- Self Care, [id number]- Manual  therapy, Taping, Dry Needling, Joint mobilization, Spinal mobilization, Scar mobilization, Cryotherapy, and Moist heat  PLAN FOR NEXT SESSION: if pain is still present, continued rectal treatment to right side of pelvic floor with AROM training, progress AROM to seated, introduce stretching   Omar Person, PT 04/29/2023, 12:14 PM

## 2023-06-02 DIAGNOSIS — T1501XA Foreign body in cornea, right eye, initial encounter: Secondary | ICD-10-CM | POA: Diagnosis not present

## 2023-06-02 DIAGNOSIS — H2513 Age-related nuclear cataract, bilateral: Secondary | ICD-10-CM | POA: Diagnosis not present

## 2023-06-02 DIAGNOSIS — H35363 Drusen (degenerative) of macula, bilateral: Secondary | ICD-10-CM | POA: Diagnosis not present

## 2023-06-02 DIAGNOSIS — H43821 Vitreomacular adhesion, right eye: Secondary | ICD-10-CM | POA: Diagnosis not present

## 2023-06-02 DIAGNOSIS — H524 Presbyopia: Secondary | ICD-10-CM | POA: Diagnosis not present

## 2023-06-07 DIAGNOSIS — F1120 Opioid dependence, uncomplicated: Secondary | ICD-10-CM | POA: Diagnosis not present

## 2023-06-07 DIAGNOSIS — M5416 Radiculopathy, lumbar region: Secondary | ICD-10-CM | POA: Diagnosis not present

## 2023-06-07 DIAGNOSIS — M461 Sacroiliitis, not elsewhere classified: Secondary | ICD-10-CM | POA: Diagnosis not present

## 2023-06-07 DIAGNOSIS — M961 Postlaminectomy syndrome, not elsewhere classified: Secondary | ICD-10-CM | POA: Diagnosis not present

## 2023-06-07 DIAGNOSIS — M25551 Pain in right hip: Secondary | ICD-10-CM | POA: Diagnosis not present

## 2023-06-09 DIAGNOSIS — M7061 Trochanteric bursitis, right hip: Secondary | ICD-10-CM | POA: Diagnosis not present

## 2023-06-10 ENCOUNTER — Ambulatory Visit: Admitting: Physical Therapy

## 2023-06-16 ENCOUNTER — Encounter: Admitting: Physical Therapy

## 2023-06-21 ENCOUNTER — Encounter (INDEPENDENT_AMBULATORY_CARE_PROVIDER_SITE_OTHER): Payer: Self-pay | Admitting: Otolaryngology

## 2023-06-21 ENCOUNTER — Ambulatory Visit (INDEPENDENT_AMBULATORY_CARE_PROVIDER_SITE_OTHER): Admitting: Otolaryngology

## 2023-06-21 VITALS — BP 123/63 | HR 76

## 2023-06-21 DIAGNOSIS — R42 Dizziness and giddiness: Secondary | ICD-10-CM

## 2023-06-21 DIAGNOSIS — J310 Chronic rhinitis: Secondary | ICD-10-CM

## 2023-06-21 DIAGNOSIS — J342 Deviated nasal septum: Secondary | ICD-10-CM

## 2023-06-21 DIAGNOSIS — J0101 Acute recurrent maxillary sinusitis: Secondary | ICD-10-CM

## 2023-06-21 DIAGNOSIS — J343 Hypertrophy of nasal turbinates: Secondary | ICD-10-CM | POA: Diagnosis not present

## 2023-06-21 DIAGNOSIS — H903 Sensorineural hearing loss, bilateral: Secondary | ICD-10-CM | POA: Diagnosis not present

## 2023-06-21 DIAGNOSIS — H9313 Tinnitus, bilateral: Secondary | ICD-10-CM | POA: Diagnosis not present

## 2023-06-21 MED ORDER — AMOXICILLIN-POT CLAVULANATE 875-125 MG PO TABS: 1.0000 | ORAL_TABLET | Freq: Two times a day (BID) | ORAL | 0 refills | Status: AC

## 2023-06-21 MED ORDER — PREDNISONE 10 MG (21) PO TBPK: ORAL_TABLET | ORAL | 0 refills | Status: AC

## 2023-06-22 DIAGNOSIS — R42 Dizziness and giddiness: Secondary | ICD-10-CM | POA: Insufficient documentation

## 2023-06-22 DIAGNOSIS — H903 Sensorineural hearing loss, bilateral: Secondary | ICD-10-CM | POA: Insufficient documentation

## 2023-06-22 DIAGNOSIS — J0101 Acute recurrent maxillary sinusitis: Secondary | ICD-10-CM | POA: Insufficient documentation

## 2023-06-22 DIAGNOSIS — J343 Hypertrophy of nasal turbinates: Secondary | ICD-10-CM | POA: Insufficient documentation

## 2023-06-22 DIAGNOSIS — H9313 Tinnitus, bilateral: Secondary | ICD-10-CM | POA: Insufficient documentation

## 2023-06-22 DIAGNOSIS — J342 Deviated nasal septum: Secondary | ICD-10-CM | POA: Insufficient documentation

## 2023-06-22 NOTE — Progress Notes (Signed)
 Patient ID: Manuel Hood, male   DOB: [birth year], 73 y.o.   MRN: [id number]  Follow-up: Chronic rhinosinusitis, eustachian tube dysfunction, hearing loss, tinnitus New complaint: Recurrent dizziness  HPI: The patient is a 73 year old male who returns today for his follow-up evaluation.  The patient was previously seen for chronic rhinosinusitis, eustachian tube dysfunction, hearing loss, and tinnitus.  He was previously noted to have nasal septal deviation and bilateral inferior turbinate hypertrophy.  He was treated with Flonase  nasal spray.  According to the patient, he was doing well until 2 months ago, when he started experiencing increasing facial pain and pressure, recurrent dizziness, and pressure sensation in his left ear.  He also complains of muffled left ear hearing.  He describes the dizziness as a lightheaded and off-balance sensation.  He denies any significant spinning vertigo.  He also denies any otorrhea or fever.  He continues to have bilateral intermittent tinnitus.  Exam: General: Communicates without difficulty, well nourished, no acute distress. Head: Normocephalic, no evidence injury, no tenderness, facial buttresses intact without stepoff. Face/sinus: No tenderness to palpation and percussion. Facial movement is normal and symmetric. Eyes: PERRL, EOMI. No scleral icterus, conjunctivae clear. Neuro: CN II exam reveals vision grossly intact.  No nystagmus at any point of gaze. Ears: Auricles well formed without lesions.  Ear canals are intact without mass or lesion.  No erythema or edema is appreciated.  The TMs are intact without fluid. Nose: External evaluation reveals normal support and skin without lesions.  Dorsum is intact.  Anterior rhinoscopy reveals congested mucosa over anterior aspect of inferior turbinates and deviated septum.  No purulence noted. Oral:  Oral cavity and oropharynx are intact, symmetric, without erythema or edema.  Mucosa is moist without lesions. Neck: Full  range of motion without pain.  There is no significant lymphadenopathy.  No masses palpable.  Thyroid  bed within normal limits to palpation.  Parotid glands and submandibular glands equal bilaterally without mass.  Trachea is midline. Neuro:  CN 2-12 grossly intact. Vestibular: No nystagmus at any point of gaze. Vestibular: There is no nystagmus with pneumatic pressure on either tympanic membrane or Valsalva. The cerebellar examination is unremarkable.   Procedure:  Flexible Nasal Endoscopy: Description: Risks, benefits, and alternatives of flexible endoscopy were explained to the patient.  Specific mention was made of the risk of throat numbness with difficulty swallowing, possible bleeding from the nose and mouth, and pain from the procedure.  The patient gave oral consent to proceed.  The flexible scope was inserted into the right nasal cavity.  Endoscopy of the interior nasal cavity, superior, inferior, and middle meatus was performed. The sphenoid-ethmoid recess was examined.  Erythematous and edematous mucosa was noted.  No polyp, mass, or lesion was appreciated. Nasal septal deviation noted. Olfactory cleft was clear.  Nasopharynx was clear.  Turbinates were hypertrophied but without mass.  The procedure was repeated on the contralateral side with similar findings.  The patient tolerated the procedure well.   Assessment: 1.  Recurrent rhinosinusitis, with erythematous and edematous nasal mucosa, nasal septal deviation, and bilateral inferior turbinate hypertrophy. 2.  His ear canals, tympanic membranes, and middle ear spaces are normal. 3.  Recurrent dizziness of unknown etiology. The possible differential diagnoses include transient BPPV, vestibular migraine, Meniere's disease, peripheral vestibular dysfunction, or other central/systemic causes.   4.  History of bilateral high-frequency sensorineural hearing loss and bilateral tinnitus.  Plan: 1.  The physical exam and nasal endoscopy findings are  reviewed with the patient. 2.  Augmentin 875 mg p.o. twice daily for 10 days. 3.  Prednisone Dosepak for 6 days. 4.  The pathophysiology of vestibular dysfunction and dizziness are discussed extensively with the patient. The possible differential diagnoses are reviewed. Questions are invited and answered.   5.  The patient will return for reevaluation in 4 weeks.

## 2023-06-29 ENCOUNTER — Telehealth (INDEPENDENT_AMBULATORY_CARE_PROVIDER_SITE_OTHER): Payer: Self-pay | Admitting: Otolaryngology

## 2023-06-29 NOTE — Telephone Encounter (Signed)
 Saw on the 20th - the headaches and dizziness and pressure has not changed.  Finished medicines prescribed.  No change.  What needs to be done next?  Please advise.  [phone]

## 2023-06-29 NOTE — Telephone Encounter (Signed)
 Make him an appt so we can see him,  thanks so much, does not have to be today, can be tomorrow or Friday

## 2023-06-30 ENCOUNTER — Ambulatory Visit (INDEPENDENT_AMBULATORY_CARE_PROVIDER_SITE_OTHER): Admitting: Otolaryngology

## 2023-06-30 ENCOUNTER — Encounter (INDEPENDENT_AMBULATORY_CARE_PROVIDER_SITE_OTHER): Payer: Self-pay | Admitting: Otolaryngology

## 2023-06-30 VITALS — BP 124/64 | HR 92

## 2023-06-30 DIAGNOSIS — J343 Hypertrophy of nasal turbinates: Secondary | ICD-10-CM | POA: Diagnosis not present

## 2023-06-30 DIAGNOSIS — J342 Deviated nasal septum: Secondary | ICD-10-CM | POA: Diagnosis not present

## 2023-06-30 DIAGNOSIS — H9313 Tinnitus, bilateral: Secondary | ICD-10-CM

## 2023-06-30 DIAGNOSIS — R42 Dizziness and giddiness: Secondary | ICD-10-CM | POA: Diagnosis not present

## 2023-06-30 DIAGNOSIS — J0101 Acute recurrent maxillary sinusitis: Secondary | ICD-10-CM | POA: Diagnosis not present

## 2023-06-30 MED ORDER — LEVOFLOXACIN 500 MG PO TABS: 500.0000 mg | ORAL_TABLET | Freq: Every day | ORAL | 0 refills | Status: AC

## 2023-07-01 NOTE — Progress Notes (Signed)
 Patient ID: Manuel Hood, male   DOB: August 14, 1950, 73 y.o.   MRN: [id number]  Follow-up: Recurrent dizziness, recurrent sinusitis, headaches, ear pain  HPI: The patient is a 73 year old male who returns today complaining of recurrent dizziness, headaches, and left ear pressure.  He was last treated for an acute sinusitis 1 week ago.  At that time, he was noted to have erythematous and edematous nasal mucosa, nasal septal deviation, and bilateral inferior turbinate hypertrophy.  He was treated with Augmentin, Flonase , and prednisone.  Despite the medical treatment, he continues to be symptomatic.  He returns today complaining of persistent pain over his forehead and the left ear.  He also complains of difficulty with his balance.  He denies any spinning vertigo.  He also denies any otorrhea, or fever.  He continues to have bilateral intermittent tinnitus.  Exam: General: Communicates without difficulty, well nourished, no acute distress. Head: Normocephalic, no evidence injury, no tenderness, facial buttresses intact without stepoff. Face/sinus: No tenderness to palpation and percussion. Facial movement is normal and symmetric. Eyes: PERRL, EOMI. No scleral icterus, conjunctivae clear. Neuro: CN II exam reveals vision grossly intact.  No nystagmus at any point of gaze. Ears: Auricles well formed without lesions.  Ear canals are intact without mass or lesion.  No erythema or edema is appreciated.  The TMs are intact without fluid. Nose: External evaluation reveals normal support and skin without lesions.  Dorsum is intact.  Anterior rhinoscopy reveals congested and erythematous mucosa over anterior aspect of inferior turbinates and deviated septum.  No purulence noted. Oral:  Oral cavity and oropharynx are intact, symmetric, without erythema or edema.  Mucosa is moist without lesions. Neck: Full range of motion without pain.  There is no significant lymphadenopathy.  No masses palpable.  Thyroid  bed within  normal limits to palpation.  Parotid glands and submandibular glands equal bilaterally without mass.  Trachea is midline. Neuro:  CN 2-12 grossly intact. Vestibular: No nystagmus at any point of gaze. Vestibular: There is no nystagmus with pneumatic pressure on either tympanic membrane or Valsalva. The cerebellar examination is unremarkable.   Assessment: 1.  Recurrent rhinosinusitis, with erythematous and edematous nasal mucosa, nasal septal deviation, and bilateral inferior turbinate hypertrophy.  He has not responded to medical treatment with Augmentin and prednisone. 2.  His ear canals, tympanic membranes, and middle ear spaces are normal. 3.  Recurrent dizziness of unknown etiology. The possible differential diagnoses include transient BPPV, vestibular migraine, Meniere's disease, peripheral vestibular dysfunction, or other central/systemic causes.   4.  It is also possible his persistent headaches may be secondary to neurogenic causes.  Plan: 1.  The physical exam findings are extensively reviewed with the patient. 2.  Levofloxacin 500 mg p.o. daily for 10 days. 3.  Continue with Flonase  nasal spray 2 sprays each nostril daily. 4.  Sinus CT scan to evaluate for possible chronic sinusitis. 5.  The pathophysiology and possible causes of his dizziness are discussed. 6.  The patient will return for reevaluation after his sinus CT scan.

## 2023-07-15 ENCOUNTER — Ambulatory Visit (HOSPITAL_COMMUNITY)
Admission: RE | Admit: 2023-07-15 | Discharge: 2023-07-15 | Disposition: A | Discharge status: Home / Self Care | Source: Ambulatory Visit | Attending: Otolaryngology | Admitting: Otolaryngology

## 2023-07-15 ENCOUNTER — Encounter (HOSPITAL_COMMUNITY): Payer: Self-pay

## 2023-07-15 DIAGNOSIS — J0101 Acute recurrent maxillary sinusitis: Secondary | ICD-10-CM | POA: Diagnosis not present

## 2023-07-15 DIAGNOSIS — J019 Acute sinusitis, unspecified: Secondary | ICD-10-CM | POA: Diagnosis not present

## 2023-07-15 DIAGNOSIS — S022XXA Fracture of nasal bones, initial encounter for closed fracture: Secondary | ICD-10-CM | POA: Diagnosis not present

## 2023-07-15 DIAGNOSIS — R519 Headache, unspecified: Secondary | ICD-10-CM | POA: Diagnosis not present

## 2023-07-23 DIAGNOSIS — T18108A Unspecified foreign body in esophagus causing other injury, initial encounter: Secondary | ICD-10-CM | POA: Diagnosis not present

## 2023-07-23 DIAGNOSIS — R1310 Dysphagia, unspecified: Secondary | ICD-10-CM | POA: Diagnosis not present

## 2023-07-23 DIAGNOSIS — W44F3XA Food entering into or through a natural orifice, initial encounter: Secondary | ICD-10-CM | POA: Diagnosis not present

## 2023-07-23 DIAGNOSIS — T18128A Food in esophagus causing other injury, initial encounter: Secondary | ICD-10-CM | POA: Diagnosis not present

## 2023-07-29 ENCOUNTER — Ambulatory Visit (INDEPENDENT_AMBULATORY_CARE_PROVIDER_SITE_OTHER): Admitting: Otolaryngology

## 2023-07-29 ENCOUNTER — Encounter (INDEPENDENT_AMBULATORY_CARE_PROVIDER_SITE_OTHER): Payer: Self-pay | Admitting: Otolaryngology

## 2023-07-29 VITALS — BP 133/75 | HR 82 | Ht 64.0 in | Wt 224.0 lb

## 2023-07-29 DIAGNOSIS — H903 Sensorineural hearing loss, bilateral: Secondary | ICD-10-CM

## 2023-07-29 DIAGNOSIS — R0981 Nasal congestion: Secondary | ICD-10-CM | POA: Diagnosis not present

## 2023-07-29 DIAGNOSIS — J342 Deviated nasal septum: Secondary | ICD-10-CM

## 2023-07-29 DIAGNOSIS — R42 Dizziness and giddiness: Secondary | ICD-10-CM

## 2023-07-29 DIAGNOSIS — R519 Headache, unspecified: Secondary | ICD-10-CM | POA: Insufficient documentation

## 2023-07-29 DIAGNOSIS — J324 Chronic pansinusitis: Secondary | ICD-10-CM | POA: Insufficient documentation

## 2023-07-29 DIAGNOSIS — J343 Hypertrophy of nasal turbinates: Secondary | ICD-10-CM

## 2023-07-29 DIAGNOSIS — H6982 Other specified disorders of Eustachian tube, left ear: Secondary | ICD-10-CM | POA: Insufficient documentation

## 2023-07-29 NOTE — Progress Notes (Signed)
 Patient ID: Manuel Hood, male   DOB: 28-Aug-1950, 73 y.o.   MRN: [id number]  Follow-up: Severe frontal headaches, recurrent dizziness, recurrent sinusitis  HPI: The patient is a 73 year old male who returns today for his follow-up evaluation.  He was last seen 1 month ago.  At that time, he was noted to have recurrent rhinosinusitis with erythematous and edematous nasal mucosa.  He was treated with levofloxacin , Flonase  nasal spray, and nasal saline irrigation.  After completing his antibiotic, he underwent a sinus CT scan.  The CT showed resolution of his acute sinusitis.  Only minimal mucosal edema was noted.  The patient returns today complaining of persistent frontal headaches.  He has also resulted in clogging sensation in his left ear and muffled hearing.  In addition, he also complains of recurrent dizziness.  He describes the dizziness as a lightheaded and off-balance sensation, with occasional spinning vertigo.  The patient was previously noted to have bilateral high-frequency sensorineural hearing loss, worse on the right side.  His MRI scan was negative for retrocochlear lesion.  The patient [facility].  According to the patient, the treatment was not effective in relieving his headaches.  He is interested in referral to a new neurologist at a tertiary care center.  Exam: General: Communicates without difficulty, well nourished, no acute distress. Head: Normocephalic, no evidence injury, no tenderness, facial buttresses intact without stepoff. Face/sinus: No tenderness to palpation and percussion. Facial movement is normal and symmetric. Eyes: PERRL, EOMI. No scleral icterus, conjunctivae clear. Neuro: CN II exam reveals vision grossly intact.  No nystagmus at any point of gaze. Ears: Auricles well formed without lesions.  Ear canals are intact without mass or lesion.  No erythema or edema is appreciated.  The TMs are intact without  fluid. Nose: External evaluation reveals normal support and skin without lesions.  Dorsum is intact.  Anterior rhinoscopy reveals congested mucosa over anterior aspect of inferior turbinates and intact septum.  No purulence noted. Oral:  Oral cavity and oropharynx are intact, symmetric, without erythema or edema.  Mucosa is moist without lesions. Neck: Full range of motion without pain.  There is no significant lymphadenopathy.  No masses palpable.  Thyroid  bed within normal limits to palpation.  Parotid glands and submandibular glands equal bilaterally without mass.  Trachea is midline. Neuro:  CN 2-12 grossly intact.   Procedure:  Flexible Nasal Endoscopy: Description: Risks, benefits, and alternatives of flexible endoscopy were explained to the patient.  Specific mention was made of the risk of throat numbness with difficulty swallowing, possible bleeding from the nose and mouth, and pain from the procedure.  The patient gave oral consent to proceed.  The flexible scope was inserted into the right nasal cavity.  Endoscopy of the interior nasal cavity, superior, inferior, and middle meatus was performed. The sphenoid-ethmoid recess was examined. Edematous mucosa was noted.  No polyp, mass, or lesion was appreciated. Nasal septal deviation noted. Olfactory cleft was clear.  Nasopharynx was clear.  Turbinates were hypertrophied but without mass.  The procedure was repeated on the contralateral side with similar findings.  The patient tolerated the procedure well.   Assessment: 1.  The patient's sinusitis has resolved.  His recent CT scan showed no significant acute or chronic sinusitis.  He continues to have mild nasal mucosal congestion, nasal septal deviation, and bilateral inferior turbinate hypertrophy. 2.  Persistent headaches of unknown etiology.  This is likely secondary to neurogenic causes.  The patient was [facility] the [facility] by Dr. Oneita without improvement in his  symptoms. 3.  Left ear eustachian tube dysfunction. 4.  Bilateral high-frequency sensorineural hearing loss.  His previous MRI scan was negative for retrocochlear lesion. 5.  Recurrent dizziness of unknown etiology. The possible differential diagnoses include transient BPPV, vestibular migraine, Meniere's disease, peripheral vestibular dysfunction, or other central/systemic causes.    Plan: 1.  The physical exam and nasal endoscopy findings are reviewed with the patient. 2.  The CT images are also extensively reviewed with the patient.  He is reassured that no acute or chronic sinusitis is noted today. 3.  The patient is frustrated by his persistent headaches and facial pain.  He would like a referral to a new neurologist at the tertiary care center. 4.  Continue Flonase  nasal spray 2 sprays each nostril daily. 5.  The treatment options for his eustachian tube dysfunction is discussed.  The patient is not interested in further treatment of his eustachian tube dysfunction, such as myringotomy and tube placement. 6.  A tertiary care neurology referral will be arranged as soon as possible.

## 2023-08-01 ENCOUNTER — Encounter (INDEPENDENT_AMBULATORY_CARE_PROVIDER_SITE_OTHER): Payer: Self-pay

## 2023-08-08 DIAGNOSIS — M47812 Spondylosis without myelopathy or radiculopathy, cervical region: Secondary | ICD-10-CM | POA: Diagnosis not present

## 2023-08-08 DIAGNOSIS — E66811 Obesity, class 1: Secondary | ICD-10-CM | POA: Diagnosis not present

## 2023-08-08 DIAGNOSIS — R1310 Dysphagia, unspecified: Secondary | ICD-10-CM | POA: Diagnosis not present

## 2023-08-08 DIAGNOSIS — Z981 Arthrodesis status: Secondary | ICD-10-CM | POA: Diagnosis not present

## 2023-08-08 DIAGNOSIS — Z9689 Presence of other specified functional implants: Secondary | ICD-10-CM | POA: Diagnosis not present

## 2023-08-08 DIAGNOSIS — G894 Chronic pain syndrome: Secondary | ICD-10-CM | POA: Diagnosis not present

## 2023-09-07 DIAGNOSIS — F1120 Opioid dependence, uncomplicated: Secondary | ICD-10-CM | POA: Diagnosis not present

## 2023-09-07 DIAGNOSIS — G4700 Insomnia, unspecified: Secondary | ICD-10-CM | POA: Diagnosis not present

## 2023-09-07 DIAGNOSIS — M47816 Spondylosis without myelopathy or radiculopathy, lumbar region: Secondary | ICD-10-CM | POA: Diagnosis not present

## 2023-09-07 DIAGNOSIS — M461 Sacroiliitis, not elsewhere classified: Secondary | ICD-10-CM | POA: Diagnosis not present

## 2023-09-07 DIAGNOSIS — M961 Postlaminectomy syndrome, not elsewhere classified: Secondary | ICD-10-CM | POA: Diagnosis not present

## 2023-09-20 DIAGNOSIS — Z981 Arthrodesis status: Secondary | ICD-10-CM | POA: Diagnosis not present

## 2023-09-20 DIAGNOSIS — R059 Cough, unspecified: Secondary | ICD-10-CM | POA: Diagnosis not present

## 2023-09-20 DIAGNOSIS — R1310 Dysphagia, unspecified: Secondary | ICD-10-CM | POA: Diagnosis not present

## 2023-10-12 DIAGNOSIS — Z9689 Presence of other specified functional implants: Secondary | ICD-10-CM | POA: Diagnosis not present

## 2023-10-12 DIAGNOSIS — R1310 Dysphagia, unspecified: Secondary | ICD-10-CM | POA: Diagnosis not present

## 2023-10-12 DIAGNOSIS — G894 Chronic pain syndrome: Secondary | ICD-10-CM | POA: Diagnosis not present

## 2023-10-12 DIAGNOSIS — Z981 Arthrodesis status: Secondary | ICD-10-CM | POA: Diagnosis not present

## 2023-10-12 DIAGNOSIS — E66811 Obesity, class 1: Secondary | ICD-10-CM | POA: Diagnosis not present

## 2023-10-12 DIAGNOSIS — M79642 Pain in left hand: Secondary | ICD-10-CM | POA: Diagnosis not present

## 2023-10-12 DIAGNOSIS — M25532 Pain in left wrist: Secondary | ICD-10-CM | POA: Diagnosis not present

## 2023-11-02 DIAGNOSIS — M79641 Pain in right hand: Secondary | ICD-10-CM | POA: Diagnosis not present

## 2023-11-02 DIAGNOSIS — G5602 Carpal tunnel syndrome, left upper limb: Secondary | ICD-10-CM | POA: Diagnosis not present

## 2023-11-02 DIAGNOSIS — M79642 Pain in left hand: Secondary | ICD-10-CM | POA: Diagnosis not present

## 2023-11-02 DIAGNOSIS — M1812 Unilateral primary osteoarthritis of first carpometacarpal joint, left hand: Secondary | ICD-10-CM | POA: Diagnosis not present

## 2023-11-17 DIAGNOSIS — R1319 Other dysphagia: Secondary | ICD-10-CM | POA: Diagnosis not present

## 2023-11-21 DIAGNOSIS — R519 Headache, unspecified: Secondary | ICD-10-CM | POA: Diagnosis not present

## 2023-11-21 DIAGNOSIS — R7303 Prediabetes: Secondary | ICD-10-CM | POA: Diagnosis not present

## 2023-11-21 DIAGNOSIS — N400 Enlarged prostate without lower urinary tract symptoms: Secondary | ICD-10-CM | POA: Diagnosis not present

## 2023-11-21 DIAGNOSIS — G8929 Other chronic pain: Secondary | ICD-10-CM | POA: Diagnosis not present

## 2023-11-21 DIAGNOSIS — Z1331 Encounter for screening for depression: Secondary | ICD-10-CM | POA: Diagnosis not present

## 2023-11-21 DIAGNOSIS — D126 Benign neoplasm of colon, unspecified: Secondary | ICD-10-CM | POA: Diagnosis not present

## 2023-11-21 DIAGNOSIS — Z23 Encounter for immunization: Secondary | ICD-10-CM | POA: Diagnosis not present

## 2023-11-21 DIAGNOSIS — I10 Essential (primary) hypertension: Secondary | ICD-10-CM | POA: Diagnosis not present

## 2023-11-21 DIAGNOSIS — E785 Hyperlipidemia, unspecified: Secondary | ICD-10-CM | POA: Diagnosis not present

## 2023-11-21 DIAGNOSIS — Z0000 Encounter for general adult medical examination without abnormal findings: Secondary | ICD-10-CM | POA: Diagnosis not present

## 2023-11-28 DIAGNOSIS — M1812 Unilateral primary osteoarthritis of first carpometacarpal joint, left hand: Secondary | ICD-10-CM | POA: Diagnosis not present

## 2023-11-28 DIAGNOSIS — G5602 Carpal tunnel syndrome, left upper limb: Secondary | ICD-10-CM | POA: Diagnosis not present

## 2023-11-29 ENCOUNTER — Encounter (INDEPENDENT_AMBULATORY_CARE_PROVIDER_SITE_OTHER): Payer: Self-pay

## 2023-11-30 DIAGNOSIS — M180 Bilateral primary osteoarthritis of first carpometacarpal joints: Secondary | ICD-10-CM | POA: Diagnosis not present

## 2023-11-30 DIAGNOSIS — G5602 Carpal tunnel syndrome, left upper limb: Secondary | ICD-10-CM | POA: Diagnosis not present

## 2023-12-05 DIAGNOSIS — K269 Duodenal ulcer, unspecified as acute or chronic, without hemorrhage or perforation: Secondary | ICD-10-CM | POA: Diagnosis not present

## 2023-12-05 DIAGNOSIS — K3189 Other diseases of stomach and duodenum: Secondary | ICD-10-CM | POA: Diagnosis not present

## 2023-12-05 DIAGNOSIS — K222 Esophageal obstruction: Secondary | ICD-10-CM | POA: Diagnosis not present

## 2023-12-05 DIAGNOSIS — R1319 Other dysphagia: Secondary | ICD-10-CM | POA: Diagnosis not present

## 2023-12-05 DIAGNOSIS — I10 Essential (primary) hypertension: Secondary | ICD-10-CM | POA: Diagnosis not present

## 2023-12-07 DIAGNOSIS — M461 Sacroiliitis, not elsewhere classified: Secondary | ICD-10-CM | POA: Diagnosis not present

## 2023-12-07 DIAGNOSIS — F1120 Opioid dependence, uncomplicated: Secondary | ICD-10-CM | POA: Diagnosis not present

## 2023-12-07 DIAGNOSIS — G5602 Carpal tunnel syndrome, left upper limb: Secondary | ICD-10-CM | POA: Diagnosis not present

## 2023-12-07 DIAGNOSIS — M47816 Spondylosis without myelopathy or radiculopathy, lumbar region: Secondary | ICD-10-CM | POA: Diagnosis not present

## 2023-12-07 DIAGNOSIS — M961 Postlaminectomy syndrome, not elsewhere classified: Secondary | ICD-10-CM | POA: Diagnosis not present

## 2023-12-12 DIAGNOSIS — Z79899 Other long term (current) drug therapy: Secondary | ICD-10-CM | POA: Diagnosis not present

## 2023-12-12 DIAGNOSIS — I10 Essential (primary) hypertension: Secondary | ICD-10-CM | POA: Diagnosis not present

## 2023-12-12 DIAGNOSIS — E785 Hyperlipidemia, unspecified: Secondary | ICD-10-CM | POA: Diagnosis not present

## 2023-12-12 DIAGNOSIS — Z87891 Personal history of nicotine dependence: Secondary | ICD-10-CM | POA: Diagnosis not present

## 2023-12-12 DIAGNOSIS — M19041 Primary osteoarthritis, right hand: Secondary | ICD-10-CM | POA: Diagnosis not present

## 2023-12-12 DIAGNOSIS — Z888 Allergy status to other drugs, medicaments and biological substances status: Secondary | ICD-10-CM | POA: Diagnosis not present

## 2023-12-12 DIAGNOSIS — M19042 Primary osteoarthritis, left hand: Secondary | ICD-10-CM | POA: Diagnosis not present

## 2023-12-12 DIAGNOSIS — G5602 Carpal tunnel syndrome, left upper limb: Secondary | ICD-10-CM | POA: Diagnosis not present

## 2023-12-12 DIAGNOSIS — K219 Gastro-esophageal reflux disease without esophagitis: Secondary | ICD-10-CM | POA: Diagnosis not present

## 2023-12-19 DIAGNOSIS — M25532 Pain in left wrist: Secondary | ICD-10-CM | POA: Diagnosis not present

## 2023-12-19 DIAGNOSIS — G5602 Carpal tunnel syndrome, left upper limb: Secondary | ICD-10-CM | POA: Diagnosis not present

## 2023-12-19 DIAGNOSIS — M2560 Stiffness of unspecified joint, not elsewhere classified: Secondary | ICD-10-CM | POA: Diagnosis not present

## 2023-12-27 DIAGNOSIS — M25532 Pain in left wrist: Secondary | ICD-10-CM | POA: Diagnosis not present

## 2023-12-27 DIAGNOSIS — G5602 Carpal tunnel syndrome, left upper limb: Secondary | ICD-10-CM | POA: Diagnosis not present

## 2023-12-27 DIAGNOSIS — M2560 Stiffness of unspecified joint, not elsewhere classified: Secondary | ICD-10-CM | POA: Diagnosis not present
